# Patient Record
Sex: Female | Born: 1935 | Race: White | Hispanic: No | Marital: Married | Smoking: Never smoker
Health system: Southern US, Community
[De-identification: ages and names within clinical notes are randomized; demographics above are authoritative.]

## PROBLEM LIST (undated history)

## (undated) DIAGNOSIS — R32 Unspecified urinary incontinence: Secondary | ICD-10-CM

## (undated) DIAGNOSIS — T7840XA Allergy, unspecified, initial encounter: Secondary | ICD-10-CM

## (undated) DIAGNOSIS — E785 Hyperlipidemia, unspecified: Secondary | ICD-10-CM

## (undated) DIAGNOSIS — I4891 Unspecified atrial fibrillation: Secondary | ICD-10-CM

## (undated) DIAGNOSIS — R001 Bradycardia, unspecified: Secondary | ICD-10-CM

## (undated) DIAGNOSIS — G459 Transient cerebral ischemic attack, unspecified: Secondary | ICD-10-CM

## (undated) DIAGNOSIS — M069 Rheumatoid arthritis, unspecified: Secondary | ICD-10-CM

## (undated) DIAGNOSIS — E559 Vitamin D deficiency, unspecified: Secondary | ICD-10-CM

## (undated) DIAGNOSIS — K219 Gastro-esophageal reflux disease without esophagitis: Secondary | ICD-10-CM

## (undated) HISTORY — DX: Unspecified urinary incontinence: R32

## (undated) HISTORY — DX: Vitamin D deficiency, unspecified: E55.9

## (undated) HISTORY — DX: Hyperlipidemia, unspecified: E78.5

## (undated) HISTORY — DX: Allergy, unspecified, initial encounter: T78.40XA

## (undated) HISTORY — DX: Gastro-esophageal reflux disease without esophagitis: K21.9

---
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
## 1992-09-27 HISTORY — PX: EYE SURGERY: SHX253

## 1996-09-27 HISTORY — PX: EYE SURGERY: SHX253

## 1998-08-26 ENCOUNTER — Encounter: Payer: Self-pay | Admitting: Internal Medicine

## 1998-08-26 ENCOUNTER — Ambulatory Visit (HOSPITAL_COMMUNITY): Admission: RE | Admit: 1998-08-26 | Discharge: 1998-08-26 | Payer: Self-pay | Admitting: Internal Medicine

## 2000-02-11 ENCOUNTER — Other Ambulatory Visit: Admission: RE | Admit: 2000-02-11 | Discharge: 2000-02-11 | Payer: Self-pay | Admitting: *Deleted

## 2000-02-16 ENCOUNTER — Ambulatory Visit (HOSPITAL_COMMUNITY): Admission: RE | Admit: 2000-02-16 | Discharge: 2000-02-16 | Payer: Self-pay | Admitting: Internal Medicine

## 2000-02-16 ENCOUNTER — Encounter: Payer: Self-pay | Admitting: Internal Medicine

## 2001-02-28 ENCOUNTER — Ambulatory Visit (HOSPITAL_COMMUNITY): Admission: RE | Admit: 2001-02-28 | Discharge: 2001-02-28 | Payer: Self-pay | Admitting: Internal Medicine

## 2001-02-28 ENCOUNTER — Encounter: Payer: Self-pay | Admitting: Internal Medicine

## 2005-08-10 ENCOUNTER — Ambulatory Visit (HOSPITAL_COMMUNITY): Admission: RE | Admit: 2005-08-10 | Discharge: 2005-08-10 | Payer: Self-pay | Admitting: Internal Medicine

## 2010-01-27 ENCOUNTER — Ambulatory Visit (HOSPITAL_COMMUNITY): Admission: RE | Admit: 2010-01-27 | Discharge: 2010-01-27 | Payer: Self-pay | Admitting: Internal Medicine

## 2012-04-12 ENCOUNTER — Other Ambulatory Visit: Payer: Self-pay | Admitting: Internal Medicine

## 2012-04-12 DIAGNOSIS — R42 Dizziness and giddiness: Secondary | ICD-10-CM

## 2012-04-12 DIAGNOSIS — R2681 Unsteadiness on feet: Secondary | ICD-10-CM

## 2012-04-12 DIAGNOSIS — R519 Headache, unspecified: Secondary | ICD-10-CM

## 2012-04-13 ENCOUNTER — Other Ambulatory Visit: Payer: Self-pay

## 2013-06-25 ENCOUNTER — Encounter (HOSPITAL_COMMUNITY): Payer: Self-pay | Admitting: Emergency Medicine

## 2013-06-25 ENCOUNTER — Emergency Department (HOSPITAL_COMMUNITY): Payer: Medicare Other

## 2013-06-25 ENCOUNTER — Inpatient Hospital Stay (HOSPITAL_COMMUNITY)
Admission: EM | Admit: 2013-06-25 | Discharge: 2013-06-27 | DRG: 069 | Disposition: A | Discharge status: Home / Self Care | Payer: Medicare Other | Attending: Internal Medicine | Admitting: Internal Medicine

## 2013-06-25 DIAGNOSIS — R4789 Other speech disturbances: Secondary | ICD-10-CM | POA: Diagnosis present

## 2013-06-25 DIAGNOSIS — G459 Transient cerebral ischemic attack, unspecified: Principal | ICD-10-CM | POA: Diagnosis present

## 2013-06-25 DIAGNOSIS — I4891 Unspecified atrial fibrillation: Secondary | ICD-10-CM

## 2013-06-25 DIAGNOSIS — Z8673 Personal history of transient ischemic attack (TIA), and cerebral infarction without residual deficits: Secondary | ICD-10-CM | POA: Diagnosis present

## 2013-06-25 DIAGNOSIS — Z79899 Other long term (current) drug therapy: Secondary | ICD-10-CM

## 2013-06-25 DIAGNOSIS — I480 Paroxysmal atrial fibrillation: Secondary | ICD-10-CM | POA: Diagnosis present

## 2013-06-25 DIAGNOSIS — R4701 Aphasia: Secondary | ICD-10-CM | POA: Diagnosis present

## 2013-06-25 DIAGNOSIS — Z7982 Long term (current) use of aspirin: Secondary | ICD-10-CM

## 2013-06-25 LAB — COMPREHENSIVE METABOLIC PANEL
ALT: 11 U/L (ref 0–35)
AST: 17 U/L (ref 0–37)
Albumin: 4 g/dL (ref 3.5–5.2)
Alkaline Phosphatase: 61 U/L (ref 39–117)
CO2: 22 mEq/L (ref 19–32)
Calcium: 10 mg/dL (ref 8.4–10.5)
Chloride: 95 mEq/L — ABNORMAL LOW (ref 96–112)
GFR calc Af Amer: 59 mL/min — ABNORMAL LOW (ref >90)
GFR calc non Af Amer: 51 mL/min — ABNORMAL LOW (ref >90)
Glucose, Bld: 110 mg/dL — ABNORMAL HIGH (ref 70–99)
Potassium: 4.3 mEq/L (ref 3.5–5.1)
Sodium: 133 mEq/L — ABNORMAL LOW (ref 135–145)

## 2013-06-25 LAB — DIFFERENTIAL
Basophils Absolute: 0 10*3/uL (ref 0.0–0.1)
Eosinophils Relative: 2 % (ref 0–5)
Lymphocytes Relative: 27 % (ref 12–46)
Lymphs Abs: 1.9 10*3/uL (ref 0.7–4.0)
Neutro Abs: 4 10*3/uL (ref 1.7–7.7)
Neutrophils Relative %: 59 % (ref 43–77)

## 2013-06-25 LAB — ETHANOL: Alcohol, Ethyl (B): <11 mg/dL (ref 0–11)

## 2013-06-25 LAB — URINALYSIS, ROUTINE W REFLEX MICROSCOPIC
Bilirubin Urine: NEGATIVE
Hgb urine dipstick: NEGATIVE
Ketones, ur: 15 mg/dL — AB
Nitrite: NEGATIVE
Specific Gravity, Urine: 1.006 (ref 1.005–1.030)
Urobilinogen, UA: 0.2 mg/dL (ref 0.0–1.0)
pH: 8 (ref 5.0–8.0)

## 2013-06-25 LAB — CBC
Hemoglobin: 13.4 g/dL (ref 12.0–15.0)
MCV: 84.2 fL (ref 78.0–100.0)
Platelets: 207 10*3/uL (ref 150–400)
RBC: 4.31 MIL/uL (ref 3.87–5.11)
RDW: 12.3 % (ref 11.5–15.5)
WBC: 6.8 10*3/uL (ref 4.0–10.5)

## 2013-06-25 LAB — RAPID URINE DRUG SCREEN, HOSP PERFORMED
Barbiturates: NOT DETECTED
Cocaine: NOT DETECTED
Opiates: NOT DETECTED
Tetrahydrocannabinol: NOT DETECTED

## 2013-06-25 LAB — GLUCOSE, CAPILLARY: Glucose-Capillary: 107 mg/dL — ABNORMAL HIGH (ref 70–99)

## 2013-06-25 LAB — POCT I-STAT TROPONIN I

## 2013-06-25 LAB — PROTIME-INR: Prothrombin Time: 13.2 seconds (ref 11.6–15.2)

## 2013-06-25 LAB — URINE MICROSCOPIC-ADD ON

## 2013-06-25 LAB — APTT: aPTT: 35 seconds (ref 24–37)

## 2013-06-25 MED ORDER — ASPIRIN EC 81 MG PO TBEC
81.0000 mg | DELAYED_RELEASE_TABLET | Freq: Every day | ORAL | Status: DC
Start: 2013-06-26 — End: 2013-06-26
  Administered 2013-06-26: 81 mg via ORAL
  Filled 2013-06-25: qty 1

## 2013-06-25 MED ORDER — HEPARIN (PORCINE) IN NACL 100-0.45 UNIT/ML-% IJ SOLN
750.0000 [IU]/h | INTRAMUSCULAR | Status: DC
  Administered 2013-06-26: 750 [IU]/h via INTRAVENOUS
  Filled 2013-06-25: qty 250

## 2013-06-25 MED ORDER — ASPIRIN 81 MG PO TABS: 81.0000 mg | ORAL_TABLET | Freq: Every day | ORAL | Status: DC

## 2013-06-25 NOTE — ED Notes (Signed)
PT daughter at bedside. States that PT was in normal state when daughter had arrived at PT home this afternoon. Daughter reports that PT suddenly became confused with slurred speech. LSN 1600.

## 2013-06-25 NOTE — ED Provider Notes (Signed)
CSN: [id number]     Arrival date & time 06/25/13  1733 History   First MD Initiated Contact with Patient 06/25/13 1811     Chief Complaint  Patient presents with  . Altered Mental Status   (Consider location/radiation/quality/duration/timing/severity/associated sxs/prior Treatment) HPI This 79 year old female lives at home with family and was last known well at 4:00 this afternoon when she had sudden onset of a transient spell that lasted about 20 minutes and is now resolved of global aphasia, she had difficulty understanding others, she had difficulty expressing herself and the wrong words were coming out, she had no headache, she is no vertigo, she is no syncope, she had no chest pain or shortness of breath or abdominal pain, she had no focal or lateralizing weakness numbness or incoordination. Her episode is resolved. She feels back to baseline now. She has some persistent left-sided parathoracic pain for the last few weeks and she fell while hiking a few weeks ago. She apparently had unremarkable chest x-ray and back x-rays at that time for her doctor. She is no neck pain or midline back pain now. There is no treatment prior to arrival. History reviewed. No pertinent past medical history. Past Surgical History  Procedure Laterality Date  . Appendectomy     History reviewed. No pertinent family history. History  Substance Use Topics  . Smoking status: Never Smoker   . Smokeless tobacco: Not on file  . Alcohol Use: No   OB History   Grav Para Term Preterm Abortions TAB SAB Ect Mult Living                 Review of Systems 10 Systems reviewed and are negative for acute change except as noted in the HPI. Allergies  Review of patient's allergies indicates no known allergies.  Home Medications   Current Outpatient Rx  Name  Route  Sig  Dispense  Refill  . clopidogrel (PLAVIX) 75 MG tablet   Oral   Take 1 tablet (75 mg total) by mouth daily with breakfast.   30 tablet   0     BP 112/44  Pulse 63  Temp(Src) 97.8 F (36.6 C) (Oral)  Resp 18  Ht 5\' 5"  (1.651 m)  Wt 136 lb 6.4 oz (61.871 kg)  BMI 22.7 kg/m2  SpO2 99% Physical Exam  Nursing note and vitals reviewed. Constitutional: She is oriented to person, place, and time.  Awake, alert, nontoxic appearance with baseline speech for patient.  HENT:  Head: Atraumatic.  Mouth/Throat: No oropharyngeal exudate.  Eyes: EOM are normal. Pupils are equal, round, and reactive to light. Right eye exhibits no discharge. Left eye exhibits no discharge.  Neck: Neck supple.  Cardiovascular: Normal rate and regular rhythm.   No murmur heard. Pulmonary/Chest: Effort normal and breath sounds normal. No stridor. No respiratory distress. She has no wheezes. She has no rales. She exhibits no tenderness.  Abdominal: Soft. Bowel sounds are normal. She exhibits no mass. There is no tenderness. There is no rebound.  Musculoskeletal: She exhibits tenderness.  Baseline ROM, moves extremities with no obvious new focal weakness. Mild left upper thoracic chest wall tenderness without midline back tenderness and no midline cervical neck tenderness  Lymphadenopathy:    She has no cervical adenopathy.  Neurological: She is alert and oriented to person, place, and time.  Awake, alert, cooperative and aware of situation; motor strength bilaterally; sensation normal to light touch bilaterally; peripheral visual fields full to confrontation; no facial asymmetry; tongue midline; major  cranial nerves appear intact; no pronator drift, normal finger to nose bilaterally  Skin: No rash noted.  Psychiatric: She has a normal mood and affect.    ED Course  Procedures (including critical care time) ECG: Sinus rhythm with multiple premature atrial and ventricular complexes, right bundle branch block, left axis deviation, no comparison ECG available   [facility] paged. 2005 D/w [facility] will see Pt in ED. 2055 Pt stable in ED with no significant  deterioration in condition. Patient / Family / Caregiver informed of clinical course, understand medical decision-making process, and agree with plan. Labs Review Labs Reviewed  CBC - Abnormal; Notable for the following:    MCHC 36.9 (*)    All other components within normal limits  COMPREHENSIVE METABOLIC PANEL - Abnormal; Notable for the following:    Sodium 133 (*)    Chloride 95 (*)    Glucose, Bld 110 (*)    GFR calc non Af Amer 51 (*)    GFR calc Af Amer 59 (*)    All other components within normal limits  URINALYSIS, ROUTINE W REFLEX MICROSCOPIC - Abnormal; Notable for the following:    Ketones, ur 15 (*)    Leukocytes, UA LARGE (*)    All other components within normal limits  GLUCOSE, CAPILLARY - Abnormal; Notable for the following:    Glucose-Capillary 107 (*)    All other components within normal limits  URINE MICROSCOPIC-ADD ON - Abnormal; Notable for the following:    Bacteria, UA FEW (*)    All other components within normal limits  CBC - Abnormal; Notable for the following:    MCHC 36.1 (*)    All other components within normal limits  BASIC METABOLIC PANEL - Abnormal; Notable for the following:    Sodium 132 (*)    CO2 14 (*)    GFR calc non Af Amer 59 (*)    GFR calc Af Amer 68 (*)    All other components within normal limits  URINE CULTURE  ETHANOL  PROTIME-INR  APTT  DIFFERENTIAL  TROPONIN I  URINE RAPID DRUG SCREEN (HOSP PERFORMED)  LIPID PANEL  CBC  GLUCOSE, CAPILLARY  GLUCOSE, CAPILLARY  HEMOGLOBIN A1C  GLUCOSE, CAPILLARY  GLUCOSE, CAPILLARY  POCT I-STAT TROPONIN I   Imaging Review Ct Head Wo Contrast  06/25/2013   *RADIOLOGY REPORT*  Clinical Data: Larey Seat Saturday afternoon, sudden onset of confusion this afternoon  CT HEAD WITHOUT CONTRAST  Technique:  Contiguous axial images were obtained from the base of the skull through the vertex without contrast.  Comparison: None.  Findings: The calvarium is intact.  There is no abnormal attenuation to  suggest hemorrhage, infarct, mass, or extra-axial fluid.  There is mild age-related mineralization of the basal ganglia.  There is age related mild to moderate atrophy.  IMPRESSION: No acute findings   Original Report Authenticated By: Esperanza Heir, M.D.   Mri Brain Without Contrast  06/26/2013   CLINICAL DATA:  79 year old female with transient aphasia that lasted 20 min. Atrial fibrillation. Initial encounter.  EXAM: MRI HEAD WITHOUT CONTRAST  MRA HEAD WITHOUT CONTRAST  TECHNIQUE: Multiplanar, multiecho pulse sequences of the brain and surrounding structures were obtained without intravenous contrast. Angiographic images of the head were obtained using MRA technique without contrast.  COMPARISON:  Head CT without contrast 06/25/2013.  FINDINGS: MRI HEAD FINDINGS  Cerebral volume is within normal limits for age. No restricted diffusion to suggest acute infarction. No midline shift, mass effect, evidence of mass lesion, ventriculomegaly, extra-axial  collection or acute intracranial hemorrhage. Cervicomedullary junction and pituitary are within normal limits. Negative visualized cervical spine. Major intracranial vascular flow voids are preserved. Wallace Cullens and white matter signal is within normal limits throughout the brain.  Postoperative changes to the globes. Right maxillary sinus mucous retention cyst. Other Visualized paranasal sinuses and mastoids are clear. Normal visualized internal auditory structures. Visualized scalp soft tissues are within normal limits. Normal bone marrow signal.  MRA HEAD FINDINGS  Antegrade flow in the posterior circulation with codominant distal vertebral arteries. Normal PICA origins. Normal vertebrobasilar junction. No basilar stenosis. SCA and PCA origins within normal limits. Posterior communicating arteries are diminutive or absent. Bilateral PCA branches are within normal limits.  Antegrade flow in both ICA siphons. Ophthalmic artery origins within normal limits. No ICA  stenosis. Normal carotid termini, MCA and ACA origins. Anterior communicating artery diminutive or absent. Visualized bilateral ACA and MCA branches are within normal limits.  IMPRESSION: MRI HEAD IMPRESSION  Normal MRI appearance of the brain.  MRA HEAD IMPRESSION  Negative intracranial MRA.   Electronically Signed   By: Augusto Gamble M.D.   On: 06/26/2013 17:52   Mr Maxine Glenn Head/brain Wo Cm  06/26/2013   CLINICAL DATA:  79 year old female with transient aphasia that lasted 20 min. Atrial fibrillation. Initial encounter.  EXAM: MRI HEAD WITHOUT CONTRAST  MRA HEAD WITHOUT CONTRAST  TECHNIQUE: Multiplanar, multiecho pulse sequences of the brain and surrounding structures were obtained without intravenous contrast. Angiographic images of the head were obtained using MRA technique without contrast.  COMPARISON:  Head CT without contrast 06/25/2013.  FINDINGS: MRI HEAD FINDINGS  Cerebral volume is within normal limits for age. No restricted diffusion to suggest acute infarction. No midline shift, mass effect, evidence of mass lesion, ventriculomegaly, extra-axial collection or acute intracranial hemorrhage. Cervicomedullary junction and pituitary are within normal limits. Negative visualized cervical spine. Major intracranial vascular flow voids are preserved. Wallace Cullens and white matter signal is within normal limits throughout the brain.  Postoperative changes to the globes. Right maxillary sinus mucous retention cyst. Other Visualized paranasal sinuses and mastoids are clear. Normal visualized internal auditory structures. Visualized scalp soft tissues are within normal limits. Normal bone marrow signal.  MRA HEAD FINDINGS  Antegrade flow in the posterior circulation with codominant distal vertebral arteries. Normal PICA origins. Normal vertebrobasilar junction. No basilar stenosis. SCA and PCA origins within normal limits. Posterior communicating arteries are diminutive or absent. Bilateral PCA branches are within normal  limits.  Antegrade flow in both ICA siphons. Ophthalmic artery origins within normal limits. No ICA stenosis. Normal carotid termini, MCA and ACA origins. Anterior communicating artery diminutive or absent. Visualized bilateral ACA and MCA branches are within normal limits.  IMPRESSION: MRI HEAD IMPRESSION  Normal MRI appearance of the brain.  MRA HEAD IMPRESSION  Negative intracranial MRA.   Electronically Signed   By: Augusto Gamble M.D.   On: 06/26/2013 17:52    MDM   1. TIA (transient ischemic attack)   2. Aphasia   3. Paroxysmal a-fib    The patient appears reasonably stabilized for admission considering the current resources, flow, and capabilities available in the ED at this time, and I doubt any other [facility] requiring further screening and/or treatment in the ED prior to admission.    Hurman Horn, MD 06/27/13 [phone]

## 2013-06-25 NOTE — ED Notes (Signed)
[facility] from home. Call for sudden onset of confusion this afternoon. Recent fall on Saturday. Ambulatory with unsteady gait on scene

## 2013-06-25 NOTE — ED Notes (Signed)
Report attempted 

## 2013-06-25 NOTE — Progress Notes (Signed)
ANTICOAGULATION CONSULT NOTE - Initial Consult  Pharmacy Consult for heparin Indication: New Afib w/ possible TIA/CVA  No Known Allergies  Patient Measurements: Height: 5\' 5"  (165.1 cm) Weight: 136 lb 6.4 oz (61.871 kg) IBW/kg (Calculated) : 57  Vital Signs: Temp: 98.3 F (36.8 C) (09/29 2321) Temp src: Oral (09/29 2321) BP: 183/88 mmHg (09/29 2321) Pulse Rate: 61 (09/29 2321)  Labs:  Recent Labs  06/25/13 1858  HGB 13.4  HCT 36.3  PLT 207  APTT 35  LABPROT 13.2  INR 1.02  CREATININE 0.96  TROPONINI <0.30    Estimated Creatinine Clearance: 36.4 ml/min (by C-G formula based on Cr of 0.96).   Medical History: History reviewed. No pertinent past medical history.  Medications:  Prescriptions prior to admission  Medication Sig Dispense Refill  . aspirin 81 MG tablet Take 81 mg by mouth daily.      . Cholecalciferol (VITAMIN D PO) Take 1 tablet by mouth daily.      Marland Kitchen GLUCOSAMINE-CHONDROITIN PO Take 1 tablet by mouth daily.      Marland Kitchen ibuprofen (ADVIL,MOTRIN) 200 MG tablet Take 200 mg by mouth every 6 (six) hours as needed for pain.      . vitamin B-12 (CYANOCOBALAMIN) 100 MCG tablet Take 50 mcg by mouth daily.       Scheduled:  . [START ON 06/26/2013] aspirin EC  81 mg Oral Daily    Assessment: 79yo female had sudden onset of confusion lasting , found to be in Afib which is new Dx for pt, with possible TIA/CVA; CT of head negative for bleed.  Goal of Therapy:  Heparin level 0.3-0.5 units/ml Monitor platelets by anticoagulation protocol: Yes   Plan:  Will begin heparin gtt at 750 units/hr and monitor heparin levels and CBC.  Vernard Gambles, PharmD, BCPS 06/25/2013,11:38 PM

## 2013-06-25 NOTE — Consult Note (Addendum)
NEURO HOSPITALIST CONSULT NOTE    Reason for Consult:transient dysphasia.  HPI:                                                                                                                                          Erica Carroll is an 79 y.o. female with no significant past medical history, brought to [facility] ED by her daughter for evaluation of a transient episode of language impairment. She said that she was at home this afternoon when suddenly experienced abrupt onset of difficulty to speak. She said that she knew what she wanted to say but couldn't form ad get words out. Daughter said that she was naming things wrongly and having trouble with comprehension. The episode lasted for about 20 minutes and completely resolved. No reported HA, vertigo, double vision, difficulty swallowing, focal weakness or numbness, slurred speech, imbalance, or visual disturbance. CT brain here at [facility] showed no acute abnormality.    History reviewed. No pertinent past medical history.  Past Surgical History  Procedure Laterality Date  . Appendectomy      History reviewed. No pertinent family history.   Social History:  reports that she has never smoked. She does not have any smokeless tobacco history on file. She reports that she does not drink alcohol or use illicit drugs.  No Known Allergies  MEDICATIONS:                                                                                                                     I have reviewed the patient's current medications.   ROS:  History obtained from the patient and daughter  General ROS: negative for - chills, fatigue, fever, night sweats, weight gain or weight loss Psychological ROS: negative for - behavioral disorder, hallucinations, mood swings or suicidal ideation. Mild memory  difficulty Ophthalmic ROS: negative for - blurry vision, double vision, eye pain or loss of vision ENT ROS: negative for - epistaxis, nasal discharge, oral lesions, sore throat, tinnitus or vertigo Allergy and Immunology ROS: negative for - hives or itchy/watery eyes Hematological and Lymphatic ROS: negative for - bleeding problems, bruising or swollen lymph nodes Endocrine ROS: negative for - galactorrhea, hair pattern changes, polydipsia/polyuria or temperature intolerance Respiratory ROS: negative for - cough, hemoptysis, shortness of breath or wheezing Cardiovascular ROS: negative for - chest pain, dyspnea on exertion, edema or irregular heartbeat Gastrointestinal ROS: negative for - abdominal pain, diarrhea, hematemesis, nausea/vomiting or stool incontinence Genito-Urinary ROS: negative for - dysuria, hematuria, incontinence or urinary frequency/urgency Musculoskeletal ROS: negative for - joint swelling or muscular weakness Neurological ROS: as noted in HPI Dermatological ROS: negative for rash and skin lesion changes   Physical exam: pleasant female in no apparent distress.Blood pressure 183/88, pulse 61, temperature 98.3 F (36.8 C), temperature source Oral, resp. rate 16, height 5\' 5"  (1.651 m), weight 61.871 kg (136 lb 6.4 oz), SpO2 97.00%.  Head: normocephalic. Neck: supple, no bruits, no JVD. Cardiac: no murmurs. Lungs: clear. Abdomen: soft, no tender, no mass. Extremities: no edema.  Neurologic Examination:                                                                                                      Mental Status: Alert, oriented, thought content appropriate.  Speech fluent without evidence of aphasia.  Able to follow 3 step commands without difficulty. Cranial Nerves: II: Discs flat bilaterally; Visual fields grossly normal, pupils equal, round, reactive to light and accommodation III,IV, VI: ptosis not present, extra-ocular motions intact bilaterally V,VII: smile  symmetric, facial light touch sensation normal bilaterally VIII: hearing normal bilaterally IX,X: gag reflex present XI: bilateral shoulder shrug XII: midline tongue extension Motor: Right : Upper extremity   5/5    Left:     Upper extremity   5/5  Lower extremity   5/5     Lower extremity   5/5 Tone and bulk:normal tone throughout; no atrophy noted Sensory: Pinprick and light touch intact throughout, bilaterally Deep Tendon Reflexes:  Right: Upper Extremity   Left: Upper extremity   biceps (C-5 to C-6) 2/4   biceps (C-5 to C-6) 2/4 tricep (C7) 2/4    triceps (C7) 2/4 Brachioradialis (C6) 2/4  Brachioradialis (C6) 2/4  Lower Extremity Lower Extremity  quadriceps (L-2 to L-4) 2/4   quadriceps (L-2 to L-4) 2/4 Achilles (S1) 2/4   Achilles (S1) 2/4  Plantars: Right: downgoing   Left: downgoing Cerebellar: normal finger-to-nose,  normal heel-to-shin test Gait:  No ataxia. CV: pulses palpable throughout    No results found for this basename: cbc, bmp, coags, chol, tri, ldl, hga1c    Results for orders placed during the hospital encounter of 06/25/13 (from the past  48 hour(s))  ETHANOL     Status: None   Collection Time    06/25/13  6:58 PM      Result Value Range   Alcohol, Ethyl (B) <11  0 - 11 mg/dL   Comment:            LOWEST DETECTABLE LIMIT FOR     SERUM ALCOHOL IS 11 mg/dL     FOR MEDICAL PURPOSES ONLY  PROTIME-INR     Status: None   Collection Time    06/25/13  6:58 PM      Result Value Range   Prothrombin Time 13.2  11.6 - 15.2 seconds   INR 1.02  0.00 - 1.49  APTT     Status: None   Collection Time    06/25/13  6:58 PM      Result Value Range   aPTT 35  24 - 37 seconds  CBC     Status: Abnormal   Collection Time    06/25/13  6:58 PM      Result Value Range   WBC 6.8  4.0 - 10.5 K/uL   RBC 4.31  3.87 - 5.11 MIL/uL   Hemoglobin 13.4  12.0 - 15.0 g/dL   HCT 40.9  81.1 - 91.4 %   MCV 84.2  78.0 - 100.0 fL   MCH 31.1  26.0 - 34.0 pg   MCHC 36.9 (*) 30.0  - 36.0 g/dL   RDW 78.2  95.6 - 21.3 %   Platelets 207  150 - 400 K/uL  DIFFERENTIAL     Status: None   Collection Time    06/25/13  6:58 PM      Result Value Range   Neutrophils Relative % 59  43 - 77 %   Neutro Abs 4.0  1.7 - 7.7 K/uL   Lymphocytes Relative 27  12 - 46 %   Lymphs Abs 1.9  0.7 - 4.0 K/uL   Monocytes Relative 11  3 - 12 %   Monocytes Absolute 0.8  0.1 - 1.0 K/uL   Eosinophils Relative 2  0 - 5 %   Eosinophils Absolute 0.1  0.0 - 0.7 K/uL   Basophils Relative 0  0 - 1 %   Basophils Absolute 0.0  0.0 - 0.1 K/uL  COMPREHENSIVE METABOLIC PANEL     Status: Abnormal   Collection Time    06/25/13  6:58 PM      Result Value Range   Sodium 133 (*) 135 - 145 mEq/L   Potassium 4.3  3.5 - 5.1 mEq/L   Chloride 95 (*) 96 - 112 mEq/L   CO2 22  19 - 32 mEq/L   Glucose, Bld 110 (*) 70 - 99 mg/dL   BUN 12  6 - 23 mg/dL   Creatinine, Ser 0.86  0.50 - 1.10 mg/dL   Calcium 57.8  8.4 - 46.9 mg/dL   Total Protein 7.1  6.0 - 8.3 g/dL   Albumin 4.0  3.5 - 5.2 g/dL   AST 17  0 - 37 U/L   ALT 11  0 - 35 U/L   Alkaline Phosphatase 61  39 - 117 U/L   Total Bilirubin 0.5  0.3 - 1.2 mg/dL   GFR calc non Af Amer 51 (*) >90 mL/min   GFR calc Af Amer 59 (*) >90 mL/min   Comment: (NOTE)     The eGFR has been calculated using the CKD EPI equation.     This calculation has not  been validated in all clinical situations.     eGFR's persistently <90 mL/min signify possible Chronic Kidney     Disease.  TROPONIN I     Status: None   Collection Time    06/25/13  6:58 PM      Result Value Range   Troponin I <0.30  <0.30 ng/mL   Comment:            Due to the release kinetics of cTnI,     a negative result within the first hours     of the onset of symptoms does not rule out     myocardial infarction with certainty.     If myocardial infarction is still suspected,     repeat the test at appropriate intervals.  POCT I-STAT TROPONIN I     Status: None   Collection Time    06/25/13  7:20 PM       Result Value Range   Troponin i, poc 0.05  0.00 - 0.08 ng/mL   Comment 3            Comment: Due to the release kinetics of cTnI,     a negative result within the first hours     of the onset of symptoms does not rule out     myocardial infarction with certainty.     If myocardial infarction is still suspected,     repeat the test at appropriate intervals.  URINE RAPID DRUG SCREEN (HOSP PERFORMED)     Status: None   Collection Time    06/25/13  7:40 PM      Result Value Range   Opiates NONE DETECTED  NONE DETECTED   Cocaine NONE DETECTED  NONE DETECTED   Benzodiazepines NONE DETECTED  NONE DETECTED   Amphetamines NONE DETECTED  NONE DETECTED   Tetrahydrocannabinol NONE DETECTED  NONE DETECTED   Barbiturates NONE DETECTED  NONE DETECTED   Comment:            DRUG SCREEN FOR MEDICAL PURPOSES     ONLY.  IF CONFIRMATION IS NEEDED     FOR ANY PURPOSE, NOTIFY LAB     WITHIN 5 DAYS.                LOWEST DETECTABLE LIMITS     FOR URINE DRUG SCREEN     Drug Class       Cutoff (ng/mL)     Amphetamine      1000     Barbiturate      200     Benzodiazepine   200     Tricyclics       300     Opiates          300     Cocaine          300     THC              50  URINALYSIS, ROUTINE W REFLEX MICROSCOPIC     Status: Abnormal   Collection Time    06/25/13  7:40 PM      Result Value Range   Color, Urine YELLOW  YELLOW   APPearance CLEAR  CLEAR   Specific Gravity, Urine 1.006  1.005 - 1.030   pH 8.0  5.0 - 8.0   Glucose, UA NEGATIVE  NEGATIVE mg/dL   Hgb urine dipstick NEGATIVE  NEGATIVE   Bilirubin Urine NEGATIVE  NEGATIVE   Ketones, ur 15 (*) NEGATIVE mg/dL  Protein, ur NEGATIVE  NEGATIVE mg/dL   Urobilinogen, UA 0.2  0.0 - 1.0 mg/dL   Nitrite NEGATIVE  NEGATIVE   Leukocytes, UA LARGE (*) NEGATIVE  URINE MICROSCOPIC-ADD ON     Status: Abnormal   Collection Time    06/25/13  7:40 PM      Result Value Range   Squamous Epithelial / LPF RARE  RARE   WBC, UA 7-10  <3 WBC/hpf    RBC / HPF 0-2  <3 RBC/hpf   Bacteria, UA FEW (*) RARE  GLUCOSE, CAPILLARY     Status: Abnormal   Collection Time    06/25/13  8:17 PM      Result Value Range   Glucose-Capillary 107 (*) 70 - 99 mg/dL    Ct Head Wo Contrast  06/25/2013   *RADIOLOGY REPORT*  Clinical Data: Larey Seat Saturday afternoon, sudden onset of confusion this afternoon  CT HEAD WITHOUT CONTRAST  Technique:  Contiguous axial images were obtained from the base of the skull through the vertex without contrast.  Comparison: None.  Findings: The calvarium is intact.  There is no abnormal attenuation to suggest hemorrhage, infarct, mass, or extra-axial fluid.  There is mild age-related mineralization of the basal ganglia.  There is age related mild to moderate atrophy.  IMPRESSION: No acute findings   Original Report Authenticated By: Esperanza Heir, M.D.     Assessment/Plan: 79 Y/O with probable TIA involving anterior circulation left brain. Currently asymptomatic, normal neuro-exam. Admit to medicine and complete TIA work up. Aspirin 81 mg daily. Will follow up.  Wyatt Portela, MD [facility] [phone]  06/25/2013, 11:49 PM

## 2013-06-26 ENCOUNTER — Inpatient Hospital Stay (HOSPITAL_COMMUNITY): Payer: Medicare Other

## 2013-06-26 DIAGNOSIS — G459 Transient cerebral ischemic attack, unspecified: Secondary | ICD-10-CM

## 2013-06-26 LAB — CBC
HCT: 37.9 % (ref 36.0–46.0)
Hemoglobin: 13.5 g/dL (ref 12.0–15.0)
MCHC: 35.6 g/dL (ref 30.0–36.0)
Platelets: 183 10*3/uL (ref 150–400)
RBC: 4.46 MIL/uL (ref 3.87–5.11)
RDW: 12.5 % (ref 11.5–15.5)
WBC: 7.2 10*3/uL (ref 4.0–10.5)

## 2013-06-26 LAB — LIPID PANEL
Cholesterol: 131 mg/dL (ref 0–200)
HDL: 50 mg/dL (ref >39)
LDL Cholesterol: 60 mg/dL (ref 0–99)
Total CHOL/HDL Ratio: 2.6 RATIO
Triglycerides: 103 mg/dL (ref <150)

## 2013-06-26 LAB — GLUCOSE, CAPILLARY
Glucose-Capillary: 93 mg/dL (ref 70–99)
Glucose-Capillary: 84 mg/dL (ref 70–99)
Glucose-Capillary: 85 mg/dL (ref 70–99)

## 2013-06-26 MED ORDER — CLOPIDOGREL BISULFATE 75 MG PO TABS
75.0000 mg | ORAL_TABLET | Freq: Every day | ORAL | Status: DC
  Administered 2013-06-27: 75 mg via ORAL
  Filled 2013-06-26 (×2): qty 1

## 2013-06-26 MED ORDER — ASPIRIN EC 325 MG PO TBEC: 325.0000 mg | DELAYED_RELEASE_TABLET | Freq: Every day | ORAL | Status: DC

## 2013-06-26 NOTE — Progress Notes (Signed)
Nutrition Brief Note  Patient identified on the Malnutrition Screening Tool (MST) Report for recent weight lost without trying and eating poorly because of a decreased appetite.  Wt Readings from Last 15 Encounters:  06/25/13 136 lb 6.4 oz (61.871 kg)    Body mass index is 22.7 kg/(m^2). Patient meets criteria for Normal based on current BMI.   Current diet order is Heart Healthy, patient is consuming approximately 90% of meals at this time. Labs and medications reviewed.   No nutrition interventions warranted at this time. If nutrition issues arise, please consult RD.   Maureen Chatters, RD, LDN Pager #: [phone] After-Hours Pager #: [phone]

## 2013-06-26 NOTE — Progress Notes (Signed)
VASCULAR LAB PRELIMINARY  PRELIMINARY  PRELIMINARY  PRELIMINARY  Carotid duplex  completed.    Preliminary report:  Bilateral:  1-39% ICA stenosis.  Vertebral artery flow is antegrade.      Emre Stock, RVT 06/26/2013, 2:33 PM

## 2013-06-26 NOTE — Progress Notes (Signed)
Patient was admitted with CVA and there was a report of AFib with RVR by the admitting MD. I cannot find any evidence of AFib .  She has lots of artifact on the monitor and this could have been mis-intrepreted as AF.   At age 79, it would not be surprising to find AFib.    [facility] Will be happy to assist you with TEE and implantable loop recorder if indicated.  Will cancel consult  Alvia Grove., MD, [facility] 06/26/2013, 10:29 AM Office - [phone] Pager [phone]

## 2013-06-26 NOTE — H&P (Signed)
[facility] History and Physical  Erica Carroll:[id number] DOB: 1921/10/19    PCP:   Nadean Corwin, MD   Chief Complaint: transcient aphasia.  HPI: Erica Carroll is an 79 y.o. female with benign PMH on daily ASA, presents to the ER with 20 minutes of with global aphasia.  She did not have any HA, focal motor deficits, nausea, vomiting, fever or chills.  EMS noted her HR was 130 at that time.  In the ER, she is asymptomatic.  EKG by EMS showed afib with controlled ventricular rate.  EKG in the ER showed NSR with PACs and no acute changes.  She never felt any irregularity in her rhythm.  Evaluation in the ER included a head CT which was negative.  Her serology was unremarkable.  Hospitalist was asked to admit her for further evaluation and treatment.    Rewiew of Systems:  Constitutional: Negative for malaise, fever and chills. No significant weight loss or weight gain Eyes: Negative for eye pain, redness and discharge, diplopia, visual changes, or flashes of light. ENMT: Negative for ear pain, hoarseness, nasal congestion, sinus pressure and sore throat. No headaches; tinnitus, drooling, or problem swallowing. Cardiovascular: Negative for chest pain, palpitations, diaphoresis, dyspnea and peripheral edema. ; No orthopnea, PND Respiratory: Negative for cough, hemoptysis, wheezing and stridor. No pleuritic chestpain. Gastrointestinal: Negative for nausea, vomiting, diarrhea, constipation, abdominal pain, melena, blood in stool, hematemesis, jaundice and rectal bleeding.    Genitourinary: Negative for frequency, dysuria, incontinence,flank pain and hematuria; Musculoskeletal: Negative for back pain and neck pain. Negative for swelling and trauma.;  Skin: . Negative for pruritus, rash, abrasions, bruising and skin lesion.; ulcerations Neuro: Negative for headache, lightheadedness and neck stiffness. Negative for weakness, altered level of consciousness , altered mental status,  extremity weakness, burning feet, involuntary movement, seizure and syncope.  Psych: negative for anxiety, depression, insomnia, tearfulness, panic attacks, hallucinations, paranoia, suicidal or homicidal ideation    History reviewed. No pertinent past medical history.  Past Surgical History  Procedure Laterality Date  . Appendectomy      Medications:  HOME MEDS: Prior to Admission medications   Medication Sig Start Date End Date Taking? Authorizing Provider  aspirin 81 MG tablet Take 81 mg by mouth daily.   Yes Historical Provider, MD  Cholecalciferol (VITAMIN D PO) Take 1 tablet by mouth daily.   Yes Historical Provider, MD  GLUCOSAMINE-CHONDROITIN PO Take 1 tablet by mouth daily.   Yes Historical Provider, MD  ibuprofen (ADVIL,MOTRIN) 200 MG tablet Take 200 mg by mouth every 6 (six) hours as needed for pain.   Yes Historical Provider, MD  vitamin B-12 (CYANOCOBALAMIN) 100 MCG tablet Take 50 mcg by mouth daily.   Yes Historical Provider, MD     Allergies:  No Known Allergies  Social History:   reports that she has never smoked. She does not have any smokeless tobacco history on file. She reports that she does not drink alcohol or use illicit drugs.  Family History: History reviewed. No pertinent family history.   Physical Exam: Filed Vitals:   06/25/13 2208 06/25/13 2321 06/26/13 0128 06/26/13 0344  BP:  183/88 147/55 137/58  Pulse:  61 58 63  Temp: 98.7 F (37.1 C) 98.3 F (36.8 C) 98.2 F (36.8 C) 97.5 F (36.4 C)  TempSrc:  Oral Oral Oral  Resp:  16 16 16   Height:      Weight:  61.871 kg (136 lb 6.4 oz)    SpO2:  97% 99% 97%  Blood pressure 137/58, pulse 63, temperature 97.5 F (36.4 C), temperature source Oral, resp. rate 16, height 5\' 5"  (1.651 m), weight 61.871 kg (136 lb 6.4 oz), SpO2 97.00%.  GEN:  Pleasant  patient lying in the stretcher in no acute distress; cooperative with exam. She is hard of hearing. PSYCH:  alert and oriented x4; does not appear  anxious or depressed; affect is appropriate. HEENT: Mucous membranes pink and anicteric; PERRLA; EOM intact; no cervical lymphadenopathy nor thyromegaly or carotid bruit; no JVD; There were no stridor. Neck is very supple. Breasts:: Not examined CHEST WALL: No tenderness CHEST: Normal respiration, clear to auscultation bilaterally.  HEART: Regular rate and rhythm.  There are no murmur, rub, or gallops.   BACK: No kyphosis or scoliosis; no CVA tenderness ABDOMEN: soft and non-tender; no masses, no organomegaly, normal abdominal bowel sounds; no pannus; no intertriginous candida. There is no rebound and no distention. Rectal Exam: Not done EXTREMITIES: No bone or joint deformity; age-appropriate arthropathy of the hands and knees; no edema; no ulcerations.  There is no calf tenderness. Genitalia: not examined PULSES: 2+ and symmetric SKIN: Normal hydration no rash or ulceration CNS: Cranial nerves 2-12 grossly intact no focal lateralizing neurologic deficit.  Speech is fluent; uvula elevated with phonation, facial symmetry and tongue midline. DTR are normal bilaterally, cerebella exam is intact, barbinski is negative and strengths are equaled bilaterally.  No sensory loss.   Labs on Admission:  Basic Metabolic Panel:  Recent Labs Lab 06/25/13 1858  NA 133*  K 4.3  CL 95*  CO2 22  GLUCOSE 110*  BUN 12  CREATININE 0.96  CALCIUM 10.0   Liver Function Tests:  Recent Labs Lab 06/25/13 1858  AST 17  ALT 11  ALKPHOS 61  BILITOT 0.5  PROT 7.1  ALBUMIN 4.0   No results found for this basename: LIPASE, AMYLASE,  in the last 168 hours No results found for this basename: AMMONIA,  in the last 168 hours CBC:  Recent Labs Lab 06/25/13 1858  WBC 6.8  NEUTROABS 4.0  HGB 13.4  HCT 36.3  MCV 84.2  PLT 207   Cardiac Enzymes:  Recent Labs Lab 06/25/13 1858  TROPONINI <0.30    CBG:  Recent Labs Lab 06/25/13 2017  GLUCAP 107*     Radiological Exams on Admission: Ct  Head Wo Contrast  06/25/2013   *RADIOLOGY REPORT*  Clinical Data: Larey Seat Saturday afternoon, sudden onset of confusion this afternoon  CT HEAD WITHOUT CONTRAST  Technique:  Contiguous axial images were obtained from the base of the skull through the vertex without contrast.  Comparison: None.  Findings: The calvarium is intact.  There is no abnormal attenuation to suggest hemorrhage, infarct, mass, or extra-axial fluid.  There is mild age-related mineralization of the basal ganglia.  There is age related mild to moderate atrophy.  IMPRESSION: No acute findings   Original Report Authenticated By: Esperanza Heir, M.D.    EKG: Independently reviewed. NSR with PAC. EMS EKG:  afib with controlled rate.   Assessment/Plan Present on Admission:  . TIA (transient ischemic attack) . Paroxysmal a-fib  PLAN:  SInce she has PAF, and does n't know if she had it for greater than 48 hours, now with TIA/CVA suspicious for thromboembolism, I will heparinize her along with continuing ASA.  Will admit her for full work up to include brain MRI/MRAs, along with ECHO and carotid ultrasounds.  I have consulted neuro for further recommendation.  Please also consult cardiology later today as  well.  She is stable, full code, and will be admitted to [facility] service.  Thank you for allowing me to participate in her care.  I have updated her treatment plan with her and her daughter who is an Charity fundraiser as well.  Other plans as per orders.  Code Status: FULL Unk Lightning, MD. [facility] Pager [phone] 7pm to 7am.  06/26/2013, 4:44 AM

## 2013-06-26 NOTE — Progress Notes (Signed)
Stroke Team Progress Note  HISTORY Erica Carroll is an 79 y.o. female with no significant past medical history, brought to [facility] ED by her daughter for evaluation of a transient episode of language impairment.   She said that she was at home this afternoon when suddenly experienced abrupt onset of difficulty to speak. She said that she knew what she wanted to say but couldn't form ad get words out. Daughter said that she was naming things wrongly and having trouble with comprehension. The episode lasted for about 20 minutes and completely resolved.  No reported HA, vertigo, double vision, difficulty swallowing, focal weakness or numbness, slurred speech, imbalance, or visual disturbance.   CT brain here at [facility] showed no acute abnormality   Patient was not a TPA candidate secondary to resolution of symptoms. She was admitted  for further evaluation and treatment. Patient was placed on IVHeparin as EKG strips looked like afib from EMS. Not with chart.  SUBJECTIVE family at the bedside.  Overall she feels her condition is gradually improving.    OBJECTIVE Most recent Vital Signs: Filed Vitals:   06/26/13 0344 06/26/13 0534 06/26/13 0830 06/26/13 1001  BP: 137/58 136/64 109/54 120/71  Pulse: 63 58 67 51  Temp: 97.5 F (36.4 C) 97.9 F (36.6 C) 98.2 F (36.8 C) 97.8 F (36.6 C)  TempSrc: Oral Oral Oral Oral  Resp: 16 16 18 18   Height:      Weight:      SpO2: 97% 99% 96% 98%   CBG (last 3)   Recent Labs  06/25/13 2017 06/26/13 0644 06/26/13 1150  GLUCAP 107* 93 84    IV Fluid Intake:      MEDICATIONS  . [START ON 06/27/2013] aspirin EC  325 mg Oral Daily   PRN:    Diet:  Cardiac thin liquids Activity:   Bathroom privileges with assistance  DVT Prophylaxis:  IV heparin infusion  CLINICALLY SIGNIFICANT STUDIES Basic Metabolic Panel:   Recent Labs Lab 06/25/13 1858  NA 133*  K 4.3  CL 95*  CO2 22  GLUCOSE 110*  BUN 12  CREATININE 0.96  CALCIUM 10.0   Liver  Function Tests:   Recent Labs Lab 06/25/13 1858  AST 17  ALT 11  ALKPHOS 61  BILITOT 0.5  PROT 7.1  ALBUMIN 4.0   CBC:   Recent Labs Lab 06/25/13 1858 06/26/13 1219  WBC 6.8 7.2  NEUTROABS 4.0  --   HGB 13.4 13.5  HCT 36.3 37.9  MCV 84.2 85.0  PLT 207 183   Coagulation:   Recent Labs Lab 06/25/13 1858  LABPROT 13.2  INR 1.02   Cardiac Enzymes:   Recent Labs Lab 06/25/13 1858  TROPONINI <0.30   Urinalysis:   Recent Labs Lab 06/25/13 1940  COLORURINE YELLOW  LABSPEC 1.006  PHURINE 8.0  GLUCOSEU NEGATIVE  HGBUR NEGATIVE  BILIRUBINUR NEGATIVE  KETONESUR 15*  PROTEINUR NEGATIVE  UROBILINOGEN 0.2  NITRITE NEGATIVE  LEUKOCYTESUR LARGE*   Lipid Panel    Component Value Date/Time   CHOL 131 06/26/2013 0500   LDL 60  HgbA1C  No results found for this basename: HGBA1C    Urine Drug Screen:     Component Value Date/Time   LABOPIA NONE DETECTED 06/25/2013 1940   COCAINSCRNUR NONE DETECTED 06/25/2013 1940   LABBENZ NONE DETECTED 06/25/2013 1940   AMPHETMU NONE DETECTED 06/25/2013 1940   THCU NONE DETECTED 06/25/2013 1940   LABBARB NONE DETECTED 06/25/2013 1940    Alcohol Level:  Recent Labs Lab 06/25/13 1858  ETH <11    Ct Head Wo Contrast 06/25/2013 No acute findings    MRI of the brain    MRA of the brain    2D Echocardiogram    Carotid Doppler    CXR    EKG  IN ED shows sinus rhythm EKG from EMS reported as afib. I do not have a copy of this.   Therapy Recommendations   Physical Exam   Mental Status:  Alert, oriented, thought content appropriate. Speech fluent without evidence of aphasia. Able to follow 3 step commands without difficulty.  Cranial Nerves:  II: Discs flat bilaterally; Visual fields grossly normal, pupils equal, round, reactive to light and accommodation  III,IV, VI: ptosis not present, extra-ocular motions intact bilaterally  V,VII: smile symmetric, facial light touch sensation normal bilaterally  VIII:  hearing normal bilaterally  IX,X: gag reflex present  XI: bilateral shoulder shrug  XII: midline tongue extension  Motor:  Right : Upper extremity 5/5 Left: Upper extremity 5/5  Lower extremity 5/5 Lower extremity 5/5  Tone and bulk:normal tone throughout; no atrophy noted  Sensory: Pinprick and light touch intact throughout, bilaterally  Deep Tendon Reflexes:  Symmetric 2+  Plantars:  Right: downgoing Left: downgoing  Cerebellar:  normal finger-to-nose, normal heel-to-shin test  Gait:  No ataxia.  CV: pulses palpable throughout    ASSESSMENT Erica Carroll is a 79 y.o. female presenting with aphasia. MR imaging pending. Infarct felt to be embolic secondary to unknown source.  On aspirin 81 mg orally every day prior to admission. Now on aspirin 81 mg orally every day plus IV HEPARIN for secondary stroke prevention. Patient with resultant transient aphasia. Work up underway.   Transient ischemic attack, studies below pending  Hospital day # 1  TREATMENT/PLAN  Change from aspirin to Plavix 75mg  daily for secondary stroke prevention.  Discontinue IV Heparin. Continue to monitor for atrial fibrillation. None documented so far. No history of it in the past. If present can switch to NOAC.  Risk factor modification  Therapy evaluations ordered  MR/carotid/echo/hgba1c pending  Gwendolyn Lima. Manson Passey, PAC, MBA, MHA [facility] Pager: [phone] 06/26/2013 2:00 PM  I have personally obtained a history, examined the patient, evaluated imaging results, and formulated the assessment and plan of care. I agree with the above. Delia Heady, MD

## 2013-06-26 NOTE — Progress Notes (Signed)
[facility] PROGRESS NOTE  Erica Carroll EAV:[id number] DOB: October 13, 1921 DOA: 06/25/2013 PCP: Nadean Corwin, MD  Assessment/Plan: Principal Problem:   TIA (transient ischemic attack) Active Problems:   Paroxysmal a-fib    1. TIA (transient ischemic attack): Patient presented with sudden onset expressive dysphasia and word-finding difficulties, which resolved after about 15-20 minutes, and has not recurred. She had no limb weakness or visual obscurations. Head CT scan was devoid of acute findings. Patient was on ASA 81 mg pre-admission. This has been increased to 325 mg daily,, and TIA/CVA work up is in progress. Dr Wyatt Portela provided neurology consultation. 2. Query Paroxysmal A. Fib: Per EMS, patient was said to be in fast atrial fibrillation en route to the ED. EKG in the ED revealed SR, with multiple PVCs, and no atrial fibrillation has been documented so far, on telemetric monitoring. IV Heparin was initially commenced by admitting MD, butt I have discontinued this. Patient may benefit from an event monitor.   Code Status: Full Code.  Family Communication:  Disposition Plan: To be determined.    Brief narrative: 79 y.o. female with benign PMH on daily ASA, presents to the ER with 20 minutes of with global aphasia. She did not have any HA, focal motor deficits, nausea, vomiting, fever or chills. EMS noted her HR was 130 at that time. In the ER, she is asymptomatic. EKG by EMS showed afib with controlled ventricular rate. EKG in the ER showed NSR with PACs and no acute changes. She never felt any irregularity in her rhythm. Evaluation in the ER included a head CT which was negative. Her serology was unremarkable. Hospitalist was asked to admit her for further evaluation and treatment.    Consultants:  Dr Wyatt Portela.  Cardiology.   Procedures:  Head CT scan.   Antibiotics:  N/A.   HPI/Subjective: Asyptomatic.  Objective: Vital signs in last 24  hours: Temp:  [97.5 F (36.4 C)-98.7 F (37.1 C)] 97.9 F (36.6 C) (09/30 0534) Pulse Rate:  [58-65] 58 (09/30 0534) Resp:  [16] 16 (09/30 0534) BP: (127-183)/(55-95) 136/64 mmHg (09/30 0534) SpO2:  [97 %-100 %] 99 % (09/30 0534) Weight:  [61.871 kg (136 lb 6.4 oz)] 61.871 kg (136 lb 6.4 oz) (09/29 2321) Weight change:     Intake/Output from previous day: 09/29 0701 - 09/30 0700 In: -  Out: 1 [Urine:1]     Physical Exam: General: Comfortable, alert, communicative, fully oriented, not short of breath at rest.  HEENT:  No clinical pallor, no jaundice, no conjunctival injection or discharge. Hydration is satisfactory.  NECK:  Supple, JVP not seen, no carotid bruits, no palpable lymphadenopathy, no palpable goiter. CHEST:  Clinically clear to auscultation, no wheezes, no crackles. HEART:  Sounds 1 and 2 heard, normal, irregular, no murmurs. ABDOMEN:  Full, soft, non-tender, no palpable organomegaly, no palpable masses, normal bowel sounds. GENITALIA:  Not examined. LOWER EXTREMITIES:  No pitting edema, palpable peripheral pulses. MUSCULOSKELETAL SYSTEM:  Generalized osteoarthritic changes, otherwise, normal. CENTRAL NERVOUS SYSTEM:  No focal neurologic deficit on gross examination.  Lab Results:  Recent Labs  06/25/13 1858  WBC 6.8  HGB 13.4  HCT 36.3  PLT 207    Recent Labs  06/25/13 1858  NA 133*  K 4.3  CL 95*  CO2 22  GLUCOSE 110*  BUN 12  CREATININE 0.96  CALCIUM 10.0   No results found for this or any previous visit (from the past 240 hour(s)).   Studies/Results: Ct Head Wo Contrast  06/25/2013   *RADIOLOGY REPORT*  Clinical Data: Larey Seat Saturday afternoon, sudden onset of confusion this afternoon  CT HEAD WITHOUT CONTRAST  Technique:  Contiguous axial images were obtained from the base of the skull through the vertex without contrast.  Comparison: None.  Findings: The calvarium is intact.  There is no abnormal attenuation to suggest hemorrhage, infarct,  mass, or extra-axial fluid.  There is mild age-related mineralization of the basal ganglia.  There is age related mild to moderate atrophy.  IMPRESSION: No acute findings   Original Report Authenticated By: Esperanza Heir, M.D.    Medications: Scheduled Meds: . aspirin EC  81 mg Oral Daily   Continuous Infusions: . heparin 750 Units/hr (06/26/13 0045)   PRN Meds:.    LOS: 1 day   Havana Baldwin,CHRISTOPHER  [facility] Pager [phone]. If 8PM-8AM, please contact night-coverage at [email or web address], password [facility] 06/26/2013, 7:31 AM  LOS: 1 day

## 2013-06-27 DIAGNOSIS — I359 Nonrheumatic aortic valve disorder, unspecified: Secondary | ICD-10-CM

## 2013-06-27 LAB — URINE CULTURE: Culture: NO GROWTH

## 2013-06-27 LAB — CBC
Hemoglobin: 13.2 g/dL (ref 12.0–15.0)
MCHC: 36.1 g/dL — ABNORMAL HIGH (ref 30.0–36.0)
MCV: 85.3 fL (ref 78.0–100.0)
Platelets: 210 10*3/uL (ref 150–400)
RBC: 4.29 MIL/uL (ref 3.87–5.11)
RDW: 12.7 % (ref 11.5–15.5)

## 2013-06-27 LAB — BASIC METABOLIC PANEL
BUN: 13 mg/dL (ref 6–23)
GFR calc Af Amer: 68 mL/min — ABNORMAL LOW (ref >90)
GFR calc non Af Amer: 59 mL/min — ABNORMAL LOW (ref >90)
Glucose, Bld: 82 mg/dL (ref 70–99)
Potassium: 4.8 mEq/L (ref 3.5–5.1)
Sodium: 132 mEq/L — ABNORMAL LOW (ref 135–145)

## 2013-06-27 LAB — HEMOGLOBIN A1C
Hgb A1c MFr Bld: 5.6 % (ref <5.7)
Mean Plasma Glucose: 114 mg/dL (ref <117)

## 2013-06-27 MED ORDER — CLOPIDOGREL BISULFATE 75 MG PO TABS: 75.0000 mg | ORAL_TABLET | Freq: Every day | ORAL | Status: DC

## 2013-06-27 NOTE — Evaluation (Signed)
Physical Therapy Evaluation Patient Details Name: Erica Carroll MRN: [id number] DOB: Jan 05, 1922 Today's Date: 06/27/2013 Time: 4098-1191 PT Time Calculation (min): 22 min  PT Assessment / Plan / Recommendation History of Present Illness  79 y.o. active female admitted to [facility] on 06/25/13 with difficulty talking.  Pt being worked up for TIA vs stroke.  CT and MRI were negative.    Clinical Impression  The pt is moving well, but has some mild balance deficits that showed up with higher level gait and balance training.  I mentioned this to her son and educated him on how to get f/u OP PT for balance if the balance issues do not seem to resolve once she goes home and is back to her normal activities (she is not normally in the bed this much).  PT will follow acutely for higher level balance and gait training and testing and for now does not recommend any therapy f/u.      PT Assessment  Patient needs continued PT services    Follow Up Recommendations  No PT follow up;Supervision - Intermittent    Does the patient have the potential to tolerate intense rehabilitation     Yes  Barriers to Discharge Other (comment) (None) None    Equipment Recommendations  None recommended by PT    Recommendations for Other Services Other (comment) (None)   Frequency Min 4X/week    Precautions / Restrictions Precautions Precautions: Fall Precaution Comments: h/[place] fall [place] the [place]   Pertinent Vitals/Pain See vitals flow sheet.       Mobility  Bed Mobility Bed Mobility: Supine to Sit;Sitting - Scoot to Edge of Bed Supine to Sit: 6: Modified independent (Device/Increase time);With rails;HOB elevated Sitting - Scoot to Edge of Bed: 6: Modified independent (Device/Increase time);With rail Details for Bed Mobility Assistance: pt used, but did not rely on railing to get into and out of bed.   Transfers Transfers: Sit to Stand;Stand to Sit Sit to Stand: 5: Supervision Stand to Sit: 5:  Supervision Details for Transfer Assistance: supervision for safety Ambulation/Gait Ambulation/Gait Assistance: 5: Supervision Ambulation Distance (Feet): 150 Feet Assistive device: None Ambulation/Gait Assistance Details: with head turns (walking and talking with head turned to the left) she loses her balance to the right, but not as bed when she turns her head to the right and does the same thing.  180 degree turns are slow and she has a mild stagger with theses as well.  Gait Pattern: Step-through pattern (staggering at times-very mild) Gait velocity: WNL Stairs: Yes Stairs Assistance: 6: Modified independent (Device/Increase time) Stair Management Technique: Two rails;Alternating pattern;Forwards Number of Stairs: 5 Modified Rankin (Stroke Patients Only) Pre-Morbid Rankin Score: No symptoms Modified Rankin: No significant disability        PT Diagnosis: Difficulty walking;Abnormality of gait  PT Problem List: Decreased balance PT Treatment Interventions: Gait training;Balance training;Therapeutic exercise;Therapeutic activities;Functional mobility training;Neuromuscular re-education;Patient/family education     PT Goals(Current goals can be found in the care plan section) Acute Rehab PT Goals Patient Stated Goal: to go home, cook this weekend for her family PT Goal Formulation: With patient/family Time For Goal Achievement: 07/11/13 Potential to Achieve Goals: Good  Visit Information  Last PT Received On: 06/27/13 Assistance Needed: +1 History of Present Illness: 79 y.o. active female admitted to [facility] on 06/25/13 with difficulty talking.  Pt being worked up for TIA vs stroke.  CT and MRI were negative.         Prior Functioning  Home Living Family/patient expects to be discharged to:: Private residence Living Arrangements: Spouse/significant other;Children Available Help at Discharge: Family;Available 24 hours/day (daughter who is an Charity fundraiser is coming to stay with her for the  wee) Type of Home: House Home Access: Stairs to enter [employer] of Steps: 1-stoop Entrance Stairs-Rails: None Home Layout: One level Home Equipment: None Additional Comments: pt is very physically active Prior Function Level of Independence: Independent Comments: cooks, cleans, stays very active. Communication Communication: HOH    Cognition  Cognition Arousal/Alertness: Awake/alert Behavior During Therapy: WFL for tasks assessed/performed Overall Cognitive Status: Within Functional Limits for tasks assessed (not specifically tested)    Extremity/Trunk Assessment Upper Extremity Assessment Upper Extremity Assessment: Defer to OT evaluation Lower Extremity Assessment Lower Extremity Assessment: Overall WFL for tasks assessed Cervical / Trunk Assessment Cervical / Trunk Assessment: Normal   Balance Balance Balance Assessed: Yes Static Sitting Balance Static Sitting - Balance Support: No upper extremity supported;Feet supported Static Sitting - Level of Assistance: 7: Independent Static Standing Balance Static Standing - Balance Support: No upper extremity supported Static Standing - Level of Assistance: 7: Independent Dynamic Standing Balance Dynamic Standing - Balance Support: No upper extremity supported Dynamic Standing - Level of Assistance: 5: Stand by assistance High Level Balance High Level Balance Activites: Turns;Direction changes;Head turns High Level Balance Comments: supervision needed due to mild staggering with higher level activities.    End of Session PT - End of Session Activity Tolerance: Patient tolerated treatment well Patient left: Other (comment) (with OT headed to the bathroom. )    [name] [name], PT, DPT [phone]   06/27/2013, 3:41 PM

## 2013-06-27 NOTE — Clinical Social Work Note (Signed)
CSW received consult for Medicaid questions. CSW consulted with financial counseling services, which referred CSW to DSS. CSW met with pt's husband, daughter, and son at bedside. CSW provided pt's family with [facility] DSS information. No other needs were noted from family. CSW followed-up with RN.   Darlyn Chamber, MSW, LCSWA Clinical Social Work [phone]

## 2013-06-27 NOTE — Progress Notes (Signed)
Stroke Team Progress Note  HISTORY Erica Carroll is an 79 y.o. female with no significant past medical history, brought to [facility] ED by her daughter for evaluation of a transient episode of language impairment.   She said that she was at home this afternoon when suddenly experienced abrupt onset of difficulty to speak. She said that she knew what she wanted to say but couldn't form ad get words out. Daughter said that she was naming things wrongly and having trouble with comprehension. The episode lasted for about 20 minutes and completely resolved.  No reported HA, vertigo, double vision, difficulty swallowing, focal weakness or numbness, slurred speech, imbalance, or visual disturbance.   CT brain here at [facility] showed no acute abnormality   Patient was not a TPA candidate secondary to resolution of symptoms. She was admitted  for further evaluation and treatment. Patient was placed on IVHeparin as EKG strips looked like afib from EMS. Not with chart.  SUBJECTIVE No new symptoms.    OBJECTIVE Most recent Vital Signs: Filed Vitals:   06/27/13 0136 06/27/13 0530 06/27/13 1000 06/27/13 1359  BP: 133/52 126/60 133/65 112/44  Pulse: 82 77 100 63  Temp: 98 F (36.7 C) 97.4 F (36.3 C) 97.5 F (36.4 C) 97.8 F (36.6 C)  TempSrc: Oral Oral Oral   Resp: 18 18 18 18   Height:      Weight:      SpO2: 100% 100% 99% 99%   CBG (last 3)   Recent Labs  06/26/13 1150 06/26/13 1713 06/27/13 1147  GLUCAP 84 85 88    IV Fluid Intake:      MEDICATIONS  . clopidogrel  75 mg Oral Q breakfast   PRN:    Diet:  Cardiac thin liquids Activity:   Bathroom privileges with assistance  DVT Prophylaxis:   CLINICALLY SIGNIFICANT STUDIES Basic Metabolic Panel:   Recent Labs Lab 06/25/13 1858 06/27/13 0530  NA 133* 132*  K 4.3 4.8  CL 95* 101  CO2 22 14*  GLUCOSE 110* 82  BUN 12 13  CREATININE 0.96 0.86  CALCIUM 10.0 8.9   Liver Function Tests:   Recent Labs Lab 06/25/13 1858  AST 17   ALT 11  ALKPHOS 61  BILITOT 0.5  PROT 7.1  ALBUMIN 4.0   CBC:   Recent Labs Lab 06/25/13 1858 06/26/13 1219 06/27/13 0530  WBC 6.8 7.2 6.8  NEUTROABS 4.0  --   --   HGB 13.4 13.5 13.2  HCT 36.3 37.9 36.6  MCV 84.2 85.0 85.3  PLT 207 183 210   Coagulation:   Recent Labs Lab 06/25/13 1858  LABPROT 13.2  INR 1.02   Cardiac Enzymes:   Recent Labs Lab 06/25/13 1858  TROPONINI <0.30   Urinalysis:   Recent Labs Lab 06/25/13 1940  COLORURINE YELLOW  LABSPEC 1.006  PHURINE 8.0  GLUCOSEU NEGATIVE  HGBUR NEGATIVE  BILIRUBINUR NEGATIVE  KETONESUR 15*  PROTEINUR NEGATIVE  UROBILINOGEN 0.2  NITRITE NEGATIVE  LEUKOCYTESUR LARGE*   Lipid Panel    Component Value Date/Time   CHOL 131 06/26/2013 0500   LDL 60  HgbA1C  Lab Results  Component Value Date   HGBA1C 5.6 06/27/2013    Urine Drug Screen:     Component Value Date/Time   LABOPIA NONE DETECTED 06/25/2013 1940   COCAINSCRNUR NONE DETECTED 06/25/2013 1940   LABBENZ NONE DETECTED 06/25/2013 1940   AMPHETMU NONE DETECTED 06/25/2013 1940   THCU NONE DETECTED 06/25/2013 1940   LABBARB NONE DETECTED  06/25/2013 1940    Alcohol Level:   Recent Labs Lab 06/25/13 1858  ETH <11    Ct Head Wo Contrast 06/25/2013 No acute findings    MRI of the brain Normal MRI appearance of the brain.   MRA of the brain Negative intracranial MRA.  2D Echocardiogram EF 55%, wall motion normal. No asd or pfo.  Carotid Doppler  Bilateral: 1-39% ICA stenosis. Vertebral artery flow is antegrade.   CXR    EKG  IN ED shows sinus rhythm EKG from EMS reported as afib. I do not have a copy of this.   Therapy Recommendations none  Physical Exam   Mental Status:  Alert, oriented, thought content appropriate. Speech fluent without evidence of aphasia. Able to follow 3 step commands without difficulty.  Cranial Nerves:  II: Discs flat bilaterally; Visual fields grossly normal, pupils equal, round, reactive to light and  accommodation  III,IV, VI: ptosis not present, extra-ocular motions intact bilaterally  V,VII: smile symmetric, facial light touch sensation normal bilaterally  VIII: hearing normal bilaterally  IX,X: gag reflex present  XI: bilateral shoulder shrug  XII: midline tongue extension  Motor:  Right : Upper extremity 5/5 Left: Upper extremity 5/5  Lower extremity 5/5 Lower extremity 5/5  Tone and bulk:normal tone throughout; no atrophy noted  Sensory: Pinprick and light touch intact throughout, bilaterally  Deep Tendon Reflexes:  Symmetric 2+  Plantars:  Right: downgoing Left: downgoing  Cerebellar:  normal finger-to-nose, normal heel-to-shin test  Gait:  No ataxia.  CV: pulses palpable throughout    ASSESSMENT Erica Carroll is a 79 y.o. female presenting with aphasia. MR imaging pending. Infarct felt to be embolic secondary to unknown source.  On aspirin 81 mg orally every day prior to admission. Now on  plavix 75 mg for secondary stroke prevention. Patient with resultant transient aphasia. Work up underway.   Transient ischemic attack  Hospital day # 2  TREATMENT/PLAN  Change from aspirin to Plavix 75mg  daily for secondary stroke prevention.   Risk factor modification  Stroke service will sign off.  Patient may follow up with Dr. Pearlean Brownie in 2 months if further symptoms, otherwise follow up with primary physician within next 2 weeks.  Gwendolyn Lima. Manson Passey, [facility], MBA, MHA [facility] Pager: [phone] 06/27/2013 4:17 PM  I have personally obtained a history, examined the patient, evaluated imaging results, and formulated the assessment and plan of care. I agree with the above. Delia Heady, MD

## 2013-06-27 NOTE — Evaluation (Addendum)
Occupational Therapy Evaluation Patient Details Name: Erica Carroll MRN: [id number] DOB: [birth year] Today's Date: 06/27/2013 Time: 6063-0160 OT Time Calculation (min): 20 min  OT Assessment / Plan / Recommendation History of present illness 79 y.o. active female admitted to [facility] on 06/25/13 with difficulty talking.  Pt being worked up for TIA vs stroke.  CT and MRI were negative.     Clinical Impression   Pt presents with below problem list. Pt independent with ADLs, PTA. Pt will benefit from acute OT to further assess vision and to improve independence prior to d/c. Pt moving well during evaluation. Presents with mild balance deficits and possible vision deficit noted-to be further assessed next session. Pt will have daughter staying with her for a little while.     OT Assessment  Patient needs continued OT Services    Follow Up Recommendations  No OT follow up;Supervision/Assistance - 24 hour    Barriers to Discharge      Equipment Recommendations  Tub/shower seat    Recommendations for Other Services    Frequency  Min 2X/week    Precautions / Restrictions Precautions Precautions: Fall Precaution Comments: h[place] fall [place]   Pertinent Vitals/Pain No pain reported.     ADL  Grooming: Set up Where Assessed - Grooming: Supported sitting Upper Body Bathing: Set up Where Assessed - Upper Body Bathing: Supported sitting Lower Body Bathing: Min guard Where Assessed - Lower Body Bathing: Supported standing Upper Body Dressing: Set up Where Assessed - Upper Body Dressing: Supported sitting Lower Body Dressing: Set up;Supervision/safety Where Assessed - Lower Body Dressing: Unsupported sit to stand Toilet Transfer: Supervision/safety Toilet Transfer Method: Sit to Barista: Regular height toilet Tub/Shower Transfer: Simulated;Minimal assistance Tub/Shower Transfer Method: Science writer: Other (comment)  (practiced stepping over) Transfers/Ambulation Related to ADLs: Min A-hand held assist. Supevision for transfers. ADL Comments: OT educated on having rugs picked up in house for safety. Also, recommended sitting to bathe. Had pt simulate stepping over tub-Min A for balance. Checked vision and will assess further in next session.  Educated on safe shoe wear.    OT Diagnosis: Disturbance of vision (Decreased balance)  OT Problem List: Impaired balance (sitting and/or standing);Impaired vision/perception;Decreased knowledge of use of DME or AE OT Treatment Interventions: Self-care/ADL training;DME and/or AE instruction;Therapeutic activities;Patient/family education;Balance training;Visual/perceptual remediation/compensation   OT Goals(Current goals can be found in the care plan section) Acute Rehab OT Goals Patient Stated Goal: go home OT Goal Formulation: With patient Time For Goal Achievement: 07/04/13 Potential to Achieve Goals: Good ADL Goals Pt Will Transfer to Toilet: with modified independence;ambulating;regular height toilet;grab bars Pt Will Perform Toileting - Clothing Manipulation and hygiene: with modified independence;sit to/from stand Pt Will Perform Tub/Shower Transfer: Tub transfer;with supervision;ambulating;shower seat Additional ADL Goal #1: Pt will participate in further vision assessment.  Visit Information  Last OT Received On: 06/27/13 Assistance Needed: +1 History of Present Illness: 79 y.o. active female admitted to [facility] on 06/25/13 with difficulty talking.  Pt being worked up for TIA vs stroke.  CT and MRI were negative.         Prior Functioning     Home Living Family/patient expects to be discharged to:: Private residence Living Arrangements: Spouse/significant other;Children Available Help at Discharge: Family;Available 24 hours/day (daughter who is an Erica Carroll fundraiser is coming to stay with her for the wee) Type of Home: House Home Access: Stairs to enter [employer] of Steps: 1-stoop Entrance Stairs-Rails: None Home Layout: One  level Home Equipment: Cane - quad;Grab bars - tub/shower;Bedside commode Additional Comments: pt is very physically active Prior Function Level of Independence: Independent Comments: cooks, cleans, stays very active. Communication Communication: HOH         Vision/Perception Vision - History Baseline Vision: Other (comment) (glasses for driving) Visual History: Cataracts;Other (comment) (had lens put in) Patient Visual Report: Other (comment) (some blurriness in left eye) Vision - Assessment Vision Assessment: Vision tested Tracking/Visual Pursuits: Other (comment) (difficulty tracking to left side at times/decreased smoothness) Visual Fields: Other (comment) (to be further tested-pt had difficulty with superior area)   Cognition  Cognition Arousal/Alertness: Awake/alert Behavior During Therapy: WFL for tasks assessed/performed Overall Cognitive Status: Within Functional Limits for tasks assessed    Extremity/Trunk Assessment Upper Extremity Assessment Upper Extremity Assessment: Overall WFL for tasks assessed     Mobility Bed Mobility Bed Mobility: Not assessed   Transfers Transfers: Sit to Stand;Stand to Sit Sit to Stand: 5: Supervision;From chair/3-in-1;From toilet Stand to Sit: 5: Supervision;To chair/3-in-1;To toilet Details for Transfer Assistance: supervision for safety     Exercise        End of Session OT - End of Session Activity Tolerance: Patient tolerated treatment well Patient left: in chair;with family/visitor present  GO     Earlie Raveling OTR/L [phone] 06/27/2013, 5:26 PM

## 2013-06-27 NOTE — Discharge Summary (Signed)
Physician Discharge Summary  Erica Carroll WGN:[id number] DOB: 12/26/1921 DOA: 06/25/2013  PCP: Erica Corwin, MD  Admit date: 06/25/2013 Discharge date: 06/27/2013  Time spent: > 35 minutes  Recommendations for Outpatient Follow-up:  1. F/u with neurologist in 2 months  Discharge Diagnoses:  Principal Problem:   TIA (transient ischemic attack)   Discharge Condition: stable  Diet recommendation: heart healthy  Filed Weights   06/25/13 2321  Weight: 61.871 kg (136 lb 6.4 oz)    History of present illness:  79 y/o female with no significant PMH who presented to the ED after developing global aphasia which resolved during her hospital stay without any intervention.  Hospital Course:   1. TIA - Carotid Dopplers showing no ICA stenosis  - MRI/MRA: reportedly negative for acute process with MRI report stating normal MRI appearance of the brain.  - Echocardiogram results reviewed and EF within normal limits at 55-60 percent.   - Currently on plavix per neuro recommendations.  - Neurology evaluated and recommended the following: Change from aspirin to Plavix 75mg  daily for secondary stroke prevention.  Risk factor modification  Stroke service will sign off.  Patient may follow up with Dr. Pearlean Brownie in 2 months if further symptoms, otherwise follow up with primary physician within next 2 weeks.   Reported Atrial fibrillation  - Case reviewed by Cardiologist who did not find evidence of atrial fibrillation but found initial EKG's to have artifact. HR has fluctuated from 51-100 while in house. No red flag reported to me on telemetry.     Procedures:  As listed above and below  Consultations:  Neurology: Dr. Pearlean Brownie  Discharge Exam: Filed Vitals:   06/27/13 1359  BP: 112/44  Pulse: 63  Temp: 97.8 F (36.6 C)  Resp: 18    General: Pt in NAD, Alert and awake Cardiovascular: RRR, no MRG Respiratory: CTA BL, no wheezes  Discharge Instructions  Discharge Orders    Future Orders Complete By Expires   Call MD for:  extreme fatigue  As directed    Diet - low sodium heart healthy  As directed    Discharge instructions  As directed    Comments:     Please be sure to follow up with your neurologist in 2 months   Increase activity slowly  As directed        Medication List    STOP taking these medications       aspirin 81 MG tablet     ibuprofen 200 MG tablet  Commonly known as:  ADVIL,MOTRIN      TAKE these medications       clopidogrel 75 MG tablet  Commonly known as:  PLAVIX  Take 1 tablet (75 mg total) by mouth daily with breakfast.     GLUCOSAMINE-CHONDROITIN PO  Take 1 tablet by mouth daily.     vitamin B-12 100 MCG tablet  Commonly known as:  CYANOCOBALAMIN  Take 50 mcg by mouth daily.     VITAMIN D PO  Take 1 tablet by mouth daily.       No Known Allergies    The results of significant diagnostics from this hospitalization (including imaging, microbiology, ancillary and laboratory) are listed below for reference.    Significant Diagnostic Studies: Ct Head Wo Contrast  06/25/2013   *RADIOLOGY REPORT*  Clinical Data: Larey Seat Saturday afternoon, sudden onset of confusion this afternoon  CT HEAD WITHOUT CONTRAST  Technique:  Contiguous axial images were obtained from the base of the skull through the  vertex without contrast.  Comparison: None.  Findings: The calvarium is intact.  There is no abnormal attenuation to suggest hemorrhage, infarct, mass, or extra-axial fluid.  There is mild age-related mineralization of the basal ganglia.  There is age related mild to moderate atrophy.  IMPRESSION: No acute findings   Original Report Authenticated By: Esperanza Heir, M.D.   Mri Brain Without Contrast  06/26/2013   CLINICAL DATA:  79 year old female with transient aphasia that lasted 20 min. Atrial fibrillation. Initial encounter.  EXAM: MRI HEAD WITHOUT CONTRAST  MRA HEAD WITHOUT CONTRAST  TECHNIQUE: Multiplanar, multiecho pulse  sequences of the brain and surrounding structures were obtained without intravenous contrast. Angiographic images of the head were obtained using MRA technique without contrast.  COMPARISON:  Head CT without contrast 06/25/2013.  FINDINGS: MRI HEAD FINDINGS  Cerebral volume is within normal limits for age. No restricted diffusion to suggest acute infarction. No midline shift, mass effect, evidence of mass lesion, ventriculomegaly, extra-axial collection or acute intracranial hemorrhage. Cervicomedullary junction and pituitary are within normal limits. Negative visualized cervical spine. Major intracranial vascular flow voids are preserved. Wallace Cullens and white matter signal is within normal limits throughout the brain.  Postoperative changes to the globes. Right maxillary sinus mucous retention cyst. Other Visualized paranasal sinuses and mastoids are clear. Normal visualized internal auditory structures. Visualized scalp soft tissues are within normal limits. Normal bone marrow signal.  MRA HEAD FINDINGS  Antegrade flow in the posterior circulation with codominant distal vertebral arteries. Normal PICA origins. Normal vertebrobasilar junction. No basilar stenosis. SCA and PCA origins within normal limits. Posterior communicating arteries are diminutive or absent. Bilateral PCA branches are within normal limits.  Antegrade flow in both ICA siphons. Ophthalmic artery origins within normal limits. No ICA stenosis. Normal carotid termini, MCA and ACA origins. Anterior communicating artery diminutive or absent. Visualized bilateral ACA and MCA branches are within normal limits.  IMPRESSION: MRI HEAD IMPRESSION  Normal MRI appearance of the brain.  MRA HEAD IMPRESSION  Negative intracranial MRA.   Electronically Signed   By: Augusto Gamble M.D.   On: 06/26/2013 17:52   Mr Maxine Glenn Head/brain Wo Cm  06/26/2013   CLINICAL DATA:  79 year old female with transient aphasia that lasted 20 min. Atrial fibrillation. Initial encounter.  EXAM:  MRI HEAD WITHOUT CONTRAST  MRA HEAD WITHOUT CONTRAST  TECHNIQUE: Multiplanar, multiecho pulse sequences of the brain and surrounding structures were obtained without intravenous contrast. Angiographic images of the head were obtained using MRA technique without contrast.  COMPARISON:  Head CT without contrast 06/25/2013.  FINDINGS: MRI HEAD FINDINGS  Cerebral volume is within normal limits for age. No restricted diffusion to suggest acute infarction. No midline shift, mass effect, evidence of mass lesion, ventriculomegaly, extra-axial collection or acute intracranial hemorrhage. Cervicomedullary junction and pituitary are within normal limits. Negative visualized cervical spine. Major intracranial vascular flow voids are preserved. Wallace Cullens and white matter signal is within normal limits throughout the brain.  Postoperative changes to the globes. Right maxillary sinus mucous retention cyst. Other Visualized paranasal sinuses and mastoids are clear. Normal visualized internal auditory structures. Visualized scalp soft tissues are within normal limits. Normal bone marrow signal.  MRA HEAD FINDINGS  Antegrade flow in the posterior circulation with codominant distal vertebral arteries. Normal PICA origins. Normal vertebrobasilar junction. No basilar stenosis. SCA and PCA origins within normal limits. Posterior communicating arteries are diminutive or absent. Bilateral PCA branches are within normal limits.  Antegrade flow in both ICA siphons. Ophthalmic artery origins within normal limits.  No ICA stenosis. Normal carotid termini, MCA and ACA origins. Anterior communicating artery diminutive or absent. Visualized bilateral ACA and MCA branches are within normal limits.  IMPRESSION: MRI HEAD IMPRESSION  Normal MRI appearance of the brain.  MRA HEAD IMPRESSION  Negative intracranial MRA.   Electronically Signed   By: Augusto Gamble M.D.   On: 06/26/2013 17:52    Microbiology: Recent Results (from the past 240 hour(s))  URINE  CULTURE     Status: None   Collection Time    06/25/13  7:40 PM      Result Value Range Status   Specimen Description URINE, CLEAN CATCH   Final   Special Requests CX ADDED AT 2201 ON [id number]   Final   Culture  Setup Time     Final   Value: 06/25/2013 23:23     Performed at [facility]   Colony Count     Final   Value: NO GROWTH     Performed at [facility]   Culture     Final   Value: NO GROWTH     Performed at [facility]   Report Status 06/27/2013 FINAL   Final     Labs: Basic Metabolic Panel:  Recent Labs Lab 06/25/13 1858 06/27/13 0530  NA 133* 132*  K 4.3 4.8  CL 95* 101  CO2 22 14*  GLUCOSE 110* 82  BUN 12 13  CREATININE 0.96 0.86  CALCIUM 10.0 8.9   Liver Function Tests:  Recent Labs Lab 06/25/13 1858  AST 17  ALT 11  ALKPHOS 61  BILITOT 0.5  PROT 7.1  ALBUMIN 4.0   No results found for this basename: LIPASE, AMYLASE,  in the last 168 hours No results found for this basename: AMMONIA,  in the last 168 hours CBC:  Recent Labs Lab 06/25/13 1858 06/26/13 1219 06/27/13 0530  WBC 6.8 7.2 6.8  NEUTROABS 4.0  --   --   HGB 13.4 13.5 13.2  HCT 36.3 37.9 36.6  MCV 84.2 85.0 85.3  PLT 207 183 210   Cardiac Enzymes:  Recent Labs Lab 06/25/13 1858  TROPONINI <0.30   BNP: BNP (last 3 results) No results found for this basename: PROBNP,  in the last 8760 hours CBG:  Recent Labs Lab 06/25/13 2017 06/26/13 0644 06/26/13 1150 06/26/13 1713 06/27/13 1147  GLUCAP 107* 93 84 85 88       Signed:  Clayburn Weekly  [facility] 06/27/2013, 4:38 PM

## 2013-06-27 NOTE — Progress Notes (Signed)
Occupational Therapy Treatment Patient Details Name: Erica Carroll MRN: [id number] DOB: 01-13-1922 Today's Date: 06/27/2013 Time: 7829-5621 OT Time Calculation (min): 28 min  OT Assessment / Plan / Recommendation  History of present illness 79 y.o. active female admitted to [facility] on 06/25/13 with difficulty talking.  Pt being worked up for TIA vs stroke.  CT and MRI were negative.     OT comments  OT further assessed vision-appears to have narrow peripheal fields and saccadic movements are jerky, however pt appears functional in her environment. Educated to drive with family and in a large parking lot and to avoid busy roads. Also, practiced tub and toilet transfer.   Follow Up Recommendations  No OT follow up;Supervision - Intermittent    Barriers to Discharge       Equipment Recommendations  Tub/shower seat    Recommendations for Other Services    Frequency Min 2X/week   Progress towards OT Goals Progress towards OT goals: Progressing toward goals  Plan Discharge plan needs to be updated    Precautions / Restrictions Precautions Precautions: Fall Precaution Comments: h/o fall when [place] the mountains   Pertinent Vitals/Pain No pain reported.     ADL  Grooming: Wash/dry hands;Supervision/safety Where Assessed - Grooming: Supported standing Upper Body Bathing: Set up Where Assessed - Upper Body Bathing: Supported sitting Lower Body Bathing: Min guard Where Assessed - Lower Body Bathing: Supported standing Upper Body Dressing: Set up Where Assessed - Upper Body Dressing: Supported sitting Lower Body Dressing: Set up;Supervision/safety Where Assessed - Lower Body Dressing: Unsupported sit to stand Toilet Transfer: Supervision/safety Toilet Transfer Method: Sit to Barista: Comfort height toilet;Grab bars Toileting - Architect and Hygiene: Supervision/safety Where Assessed - Engineer, mining and Hygiene: Sit to stand from  3-in-1 or toilet Tub/Shower Transfer: Performed;Minimal assistance Tub/Shower Transfer Method: Science writer: Other (comment);Shower seat with back (practiced stepping over tub) Transfers/Ambulation Related to ADLs: Min guard for ambulation and Supervision for transfers. ADL Comments: OT further assessed vision-appears to have narrow peripheal fields and saccadic movements are jerky, however pt appears functional in her environment. Educated to drive with family and in a large parking lot and to avoid busy roads. Educated pt to sit to bathe on a chair and also talked to son about this-told daughter earlier. Practiced stepping over tub-educated to have someone with her when doing this.     OT Diagnosis: Disturbance of vision (Decreased balance)  OT Problem List: Impaired balance (sitting and/or standing);Impaired vision/perception;Decreased knowledge of use of DME or AE OT Treatment Interventions: Self-care/ADL training;DME and/or AE instruction;Therapeutic activities;Patient/family education;Balance training;Visual/perceptual remediation/compensation   OT Goals(current goals can now be found in the care plan section) Acute Rehab OT Goals Patient Stated Goal: go home OT Goal Formulation: With patient Time For Goal Achievement: 07/04/13 Potential to Achieve Goals: Good ADL Goals Pt Will Transfer to Toilet: with modified independence;ambulating;regular height toilet;grab bars Pt Will Perform Toileting - Clothing Manipulation and hygiene: with modified independence;sit to/from stand Pt Will Perform Tub/Shower Transfer: Tub transfer;with supervision;ambulating;shower seat Additional ADL Goal #1: Pt will participate in further vision assessment.  Visit Information  Last OT Received On: 06/27/13 Assistance Needed: +1 History of Present Illness: 79 y.o. active female admitted to [facility] on 06/25/13 with difficulty talking.  Pt being worked up for TIA vs stroke.  CT and MRI  were negative.      Subjective Data      Prior Functioning  Home Living Family/patient expects to be  discharged to:: Private residence Living Arrangements: Spouse/significant other;Children Available Help at Discharge: Family;Available 24 hours/day (daughter who is an Charity fundraiser is coming to stay with her for the wee) Type of Home: House Home Access: Stairs to enter [employer] of Steps: 1-stoop Entrance Stairs-Rails: None Home Layout: One level Home Equipment: Cane - quad;Grab bars - tub/shower;Bedside commode Additional Comments: pt is very physically active Prior Function Level of Independence: Independent Comments: cooks, cleans, stays very active. Communication Communication: HOH    Cognition  Cognition Arousal/Alertness: Awake/alert Behavior During Therapy: WFL for tasks assessed/performed Overall Cognitive Status: Within Functional Limits for tasks assessed    Mobility  Bed Mobility Bed Mobility: Not assessed Transfers Transfers: Sit to Stand;Stand to Sit Sit to Stand: 5: Supervision;From chair/3-in-1;From toilet Stand to Sit: 5: Supervision;To toilet;To chair/3-in-1 Details for Transfer Assistance: used grab bar in bathroom, but pt has counter near at home. Supervision for safety.   Exercises         End of Session OT - End of Session Activity Tolerance: Patient tolerated treatment well Patient left: in chair;with family/visitor present  GO     Earlie Raveling OTR/L [phone] 06/27/2013, 6:06 PM

## 2013-06-27 NOTE — Progress Notes (Signed)
Orders received for swallow evaluation however per RN, patient has passed the RN stroke swallow screen and is tolerating diet. Defer formal evaluation per protocol. Please re-consult as needed.  Thank you  Shekela Goodridge MA, CCC-SLP [phone]   

## 2013-06-27 NOTE — Progress Notes (Signed)
Echocardiogram 2D Echocardiogram has been performed.  Erica Carroll 06/27/2013, 10:36 AM

## 2013-07-02 LAB — GLUCOSE, CAPILLARY: Glucose-Capillary: 91 mg/dL (ref 70–99)

## 2013-07-10 ENCOUNTER — Encounter (HOSPITAL_COMMUNITY): Payer: Self-pay | Admitting: Emergency Medicine

## 2013-07-10 ENCOUNTER — Inpatient Hospital Stay (HOSPITAL_COMMUNITY)
Admission: EM | Admit: 2013-07-10 | Discharge: 2013-07-14 | DRG: 310 | Disposition: A | Discharge status: Home / Self Care | Payer: Medicare Other | Attending: Internal Medicine | Admitting: Internal Medicine

## 2013-07-10 ENCOUNTER — Emergency Department (HOSPITAL_COMMUNITY): Payer: Medicare Other

## 2013-07-10 DIAGNOSIS — I4891 Unspecified atrial fibrillation: Principal | ICD-10-CM | POA: Diagnosis present

## 2013-07-10 DIAGNOSIS — Z7902 Long term (current) use of antithrombotics/antiplatelets: Secondary | ICD-10-CM

## 2013-07-10 DIAGNOSIS — I498 Other specified cardiac arrhythmias: Secondary | ICD-10-CM | POA: Diagnosis present

## 2013-07-10 DIAGNOSIS — D649 Anemia, unspecified: Secondary | ICD-10-CM

## 2013-07-10 DIAGNOSIS — Z8673 Personal history of transient ischemic attack (TIA), and cerebral infarction without residual deficits: Secondary | ICD-10-CM

## 2013-07-10 DIAGNOSIS — E876 Hypokalemia: Secondary | ICD-10-CM

## 2013-07-10 DIAGNOSIS — R55 Syncope and collapse: Secondary | ICD-10-CM

## 2013-07-10 DIAGNOSIS — R42 Dizziness and giddiness: Secondary | ICD-10-CM

## 2013-07-10 DIAGNOSIS — K219 Gastro-esophageal reflux disease without esophagitis: Secondary | ICD-10-CM

## 2013-07-10 DIAGNOSIS — R001 Bradycardia, unspecified: Secondary | ICD-10-CM

## 2013-07-10 DIAGNOSIS — G459 Transient cerebral ischemic attack, unspecified: Secondary | ICD-10-CM

## 2013-07-10 DIAGNOSIS — I480 Paroxysmal atrial fibrillation: Secondary | ICD-10-CM | POA: Diagnosis present

## 2013-07-10 DIAGNOSIS — I452 Bifascicular block: Secondary | ICD-10-CM | POA: Diagnosis present

## 2013-07-10 DIAGNOSIS — I959 Hypotension, unspecified: Secondary | ICD-10-CM | POA: Diagnosis present

## 2013-07-10 HISTORY — DX: Unspecified atrial fibrillation: I48.91

## 2013-07-10 HISTORY — DX: Transient cerebral ischemic attack, unspecified: G45.9

## 2013-07-10 HISTORY — DX: Bradycardia, unspecified: R00.1

## 2013-07-10 LAB — TSH: TSH: 0.948 u[IU]/mL (ref 0.350–4.500)

## 2013-07-10 LAB — CBC WITH DIFFERENTIAL/PLATELET
Eosinophils Relative: 2 % (ref 0–5)
Hemoglobin: 11.5 g/dL — ABNORMAL LOW (ref 12.0–15.0)
Lymphocytes Relative: 27 % (ref 12–46)
Lymphs Abs: 1.8 10*3/uL (ref 0.7–4.0)
Monocytes Relative: 10 % (ref 3–12)
Neutro Abs: 4 10*3/uL (ref 1.7–7.7)
Neutrophils Relative %: 61 % (ref 43–77)
Platelets: 156 10*3/uL (ref 150–400)
RBC: 3.75 MIL/uL — ABNORMAL LOW (ref 3.87–5.11)
WBC: 6.6 10*3/uL (ref 4.0–10.5)

## 2013-07-10 LAB — POCT I-STAT TROPONIN I: Troponin i, poc: 0.04 ng/mL (ref 0.00–0.08)

## 2013-07-10 LAB — COMPREHENSIVE METABOLIC PANEL
ALT: 8 U/L (ref 0–35)
Alkaline Phosphatase: 55 U/L (ref 39–117)
BUN: 11 mg/dL (ref 6–23)
CO2: 20 mEq/L (ref 19–32)
Chloride: 103 mEq/L (ref 96–112)
GFR calc Af Amer: 70 mL/min — ABNORMAL LOW (ref >90)
Glucose, Bld: 77 mg/dL (ref 70–99)
Potassium: 3.2 mEq/L — ABNORMAL LOW (ref 3.5–5.1)
Sodium: 136 mEq/L (ref 135–145)
Total Bilirubin: 0.3 mg/dL (ref 0.3–1.2)
Total Protein: 6.2 g/dL (ref 6.0–8.3)

## 2013-07-10 LAB — TROPONIN I: Troponin I: <0.3 ng/mL (ref <0.30)

## 2013-07-10 LAB — LIPASE, BLOOD: Lipase: 45 U/L (ref 11–59)

## 2013-07-10 MED ORDER — SODIUM CHLORIDE 0.9 % IV SOLN: 250.0000 mL | INTRAVENOUS | Status: DC | PRN

## 2013-07-10 MED ORDER — SODIUM CHLORIDE 0.9 % IJ SOLN: 3.0000 mL | Freq: Two times a day (BID) | INTRAMUSCULAR | Status: DC

## 2013-07-10 MED ORDER — ONDANSETRON HCL 4 MG PO TABS: 4.0000 mg | ORAL_TABLET | Freq: Four times a day (QID) | ORAL | Status: DC | PRN

## 2013-07-10 MED ORDER — SODIUM CHLORIDE 0.9 % IJ SOLN
3.0000 mL | Freq: Two times a day (BID) | INTRAMUSCULAR | Status: DC
  Administered 2013-07-10: 3 mL via INTRAVENOUS

## 2013-07-10 MED ORDER — ONDANSETRON HCL 4 MG/2ML IJ SOLN: 4.0000 mg | Freq: Four times a day (QID) | INTRAMUSCULAR | Status: DC | PRN

## 2013-07-10 MED ORDER — DILTIAZEM HCL 30 MG PO TABS
30.0000 mg | ORAL_TABLET | Freq: Four times a day (QID) | ORAL | Status: DC
  Filled 2013-07-10 (×6): qty 1

## 2013-07-10 MED ORDER — CLOPIDOGREL BISULFATE 75 MG PO TABS
75.0000 mg | ORAL_TABLET | Freq: Every day | ORAL | Status: DC
  Administered 2013-07-10: 75 mg via ORAL
  Filled 2013-07-10: qty 1

## 2013-07-10 MED ORDER — SODIUM CHLORIDE 0.9 % IJ SOLN: 3.0000 mL | INTRAMUSCULAR | Status: DC | PRN

## 2013-07-10 MED ORDER — HYDROCODONE-ACETAMINOPHEN 5-325 MG PO TABS: 1.0000 | ORAL_TABLET | ORAL | Status: DC | PRN

## 2013-07-10 MED ORDER — ENOXAPARIN SODIUM 40 MG/0.4ML ~~LOC~~ SOLN
40.0000 mg | SUBCUTANEOUS | Status: DC
  Administered 2013-07-10: 40 mg via SUBCUTANEOUS
  Filled 2013-07-10: qty 0.4

## 2013-07-10 MED ORDER — POTASSIUM CHLORIDE IN NACL 20-0.9 MEQ/L-% IV SOLN
INTRAVENOUS | Status: DC
  Administered 2013-07-10 (×2): via INTRAVENOUS
  Filled 2013-07-10 (×5): qty 1000

## 2013-07-10 MED ORDER — SIMETHICONE 80 MG PO CHEW
80.0000 mg | CHEWABLE_TABLET | Freq: Four times a day (QID) | ORAL | Status: AC
  Administered 2013-07-10 (×4): 80 mg via ORAL
  Filled 2013-07-10 (×4): qty 1

## 2013-07-10 MED ORDER — ASPIRIN 81 MG PO CHEW
324.0000 mg | CHEWABLE_TABLET | Freq: Once | ORAL | Status: AC
  Administered 2013-07-10: 324 mg via ORAL
  Filled 2013-07-10: qty 4

## 2013-07-10 MED ORDER — HEPARIN (PORCINE) IN NACL 100-0.45 UNIT/ML-% IJ SOLN
700.0000 [IU]/h | INTRAMUSCULAR | Status: DC
  Administered 2013-07-10: 900 [IU]/h via INTRAVENOUS
  Administered 2013-07-12: 800 [IU]/h via INTRAVENOUS
  Filled 2013-07-10 (×4): qty 250

## 2013-07-10 MED ORDER — ALUM & MAG HYDROXIDE-SIMETH 200-200-20 MG/5ML PO SUSP: 30.0000 mL | Freq: Four times a day (QID) | ORAL | Status: DC | PRN

## 2013-07-10 MED ORDER — DILTIAZEM HCL 30 MG PO TABS
30.0000 mg | ORAL_TABLET | Freq: Four times a day (QID) | ORAL | Status: DC
  Administered 2013-07-10 (×2): 30 mg via ORAL
  Filled 2013-07-10 (×4): qty 1

## 2013-07-10 MED ORDER — PANTOPRAZOLE SODIUM 40 MG PO TBEC
40.0000 mg | DELAYED_RELEASE_TABLET | Freq: Every day | ORAL | Status: DC
  Administered 2013-07-10 – 2013-07-13 (×4): 40 mg via ORAL
  Filled 2013-07-10 (×2): qty 1

## 2013-07-10 MED ORDER — SODIUM CHLORIDE 0.9 % IV BOLUS (SEPSIS)
1000.0000 mL | Freq: Once | INTRAVENOUS | Status: AC
  Administered 2013-07-10: 1000 mL via INTRAVENOUS

## 2013-07-10 MED ORDER — POTASSIUM CHLORIDE CRYS ER 20 MEQ PO TBCR
40.0000 meq | EXTENDED_RELEASE_TABLET | ORAL | Status: AC
  Administered 2013-07-10 (×2): 40 meq via ORAL
  Filled 2013-07-10 (×2): qty 2

## 2013-07-10 MED ORDER — HEPARIN BOLUS VIA INFUSION
3000.0000 [IU] | Freq: Once | INTRAVENOUS | Status: AC
  Administered 2013-07-10: 3000 [IU] via INTRAVENOUS
  Filled 2013-07-10: qty 3000

## 2013-07-10 NOTE — ED Notes (Signed)
Phlebotomy at the bedside  

## 2013-07-10 NOTE — ED Notes (Signed)
Pharmacy tech at the bedside 

## 2013-07-10 NOTE — Progress Notes (Signed)
Utilization review completed.  

## 2013-07-10 NOTE — ED Notes (Signed)
Per EMS pt states she started to feel dizzy and weak around midnight tonight, EMS states pt had not gone to sleep yet this evening. Per EMS pt is from home and lives with her husband. Pt's son was at the home when EMS arrived, pt was diaphoretic, clammy, and pale. EMS sat pt up and applied 2L 02. Pt's initial EKG presented with Atrial fibrillation. EMS reports pt's HR was as high as 193 briefly, however she was fluctuation between 120-170. EMS reports the pt converted on her own to NSR about 15 minutes prior to arrival in department. Per EMS pt was seen in the hospital on Sept. 26 for a possible stroke and was discharged with instructions to take Plavix. Pt was also was seen in the hospital about a week ago for a fall, EMS reports the pt is still sore on her left side. Per EMS pt is in NSR with an occasional PVC and PAC.

## 2013-07-10 NOTE — ED Notes (Signed)
Dr. Manly at the bedside.  

## 2013-07-10 NOTE — Progress Notes (Signed)
ANTICOAGULATION CONSULT NOTE - Initial Consult  Pharmacy Consult for heparin Indication: atrial fibrillation  No Known Allergies  Patient Measurements: Weight: 135 lb (61.236 kg)   Vital Signs: Temp: 97.4 F (36.3 C) (10/14 0948) Temp src: Oral (10/14 0948) BP: 132/47 mmHg (10/14 0948) Pulse Rate: 52 (10/14 0948)  Labs:  Recent Labs  07/10/13 0714 07/10/13 1020  HGB 11.5*  --   HCT 32.0*  --   PLT 156  --   CREATININE 0.84  --   TROPONINI  --  <0.30    The CrCl is unknown because both a height and weight (above a minimum accepted value) are required for this calculation.   Medical History: Past Medical History  Diagnosis Date  . TIA (transient ischemic attack)     a. with fall 05/2013 -> neg head CT and MRI/MRA;  b. Plavix started.  . A-fib     a. Dx 06/2013;  b. 06/2013 Echo:  EF 55-60%, no reg wma, mild AI, mod dil LA.    Medications:  Prescriptions prior to admission  Medication Sig Dispense Refill  . Cholecalciferol (VITAMIN D PO) Take 1 tablet by mouth daily.      . clopidogrel (PLAVIX) 75 MG tablet Take 1 tablet (75 mg total) by mouth daily with breakfast.  30 tablet  0  . GLUCOSAMINE-CHONDROITIN PO Take 1 tablet by mouth daily.      . vitamin B-12 (CYANOCOBALAMIN) 100 MCG tablet Take 50 mcg by mouth daily.        Assessment: 79 yo F to start heparin for afib w/ RVR in ambulance but in NSR since arriving at the hospitial.  She had recent TIA 2 weeks ago with fall 05/2013 -> neg head CT and MRI/MRA, plavix was started.  CHA2DS2VASc = 5 Anemia: Hgb dropped from 13.2 last hospitalization two wks ago to 11.5 today. No hematuria, BRBPR, melena, or excessive bleeding. IM following. Stool guaiac test ordered. Will need f/u once anticoagulation ordered.  She was given LMWH 40 mg today at 1128 am. Plavix to be stopped with starting coumadin.  CM to explore possible coverage for NOAC 2nd pt does not drive and may have difficulties getting INRs checked.    Goal of  Therapy:  Heparin level 0.3-0.7 units/ml Monitor platelets by anticoagulation protocol: Yes   Plan:  Give 3000 units bolus x 1 Start heparin infusion at 900 units/hr Check anti-Xa level in 8 hours and daily while on heparin Continue to monitor H&H and platelets Herby Abraham, Pharm.D. [phone] 07/10/2013 3:26 PM

## 2013-07-10 NOTE — ED Provider Notes (Signed)
CSN: [id number]     Arrival date & time 07/10/13  4098 History   First MD Initiated Contact with Patient 07/10/13 0540     Chief Complaint  Patient presents with  . Atrial Fibrillation   (Consider location/radiation/quality/duration/timing/severity/associated sxs/prior Treatment) HPI This patient is a very pleasant elderly woman with admission last month for syncope and recently diagnosed paroxysmal AF. She presents via EMS from home with feeling of lightheadedness, palpitations and slight "twinge" of pain in the left inferolateral mammary region. She denies experiencing chest pain at this time. Denies SOB.   Patient says she had some belching earlier in the night and then felt lightheaded while in bed. It was around that time that she felt palpitations as well. The patient was found to be in AF with RVR with rate as high as 190s but fluctuating mainly in the 120-170 range, per EMS. She was noted to be pale and diaphoretic appearing. Paramedics report that she converted spontaneously to NSR en route. Her BP was wnl. She was treated with supplemental 02 en route.   She says she still feels "a little dizzy". She denies ongoing chest discomfort or palpitations. No recent cough or fever. Reports compliance with all meds. Was recently started on Plavix. Had a normal TTE aproximately 1 month ago. .   Past Medical History  Diagnosis Date  . Stroke     possibly   Past Surgical History  Procedure Laterality Date  . Appendectomy     No family history on file. History  Substance Use Topics  . Smoking status: Never Smoker   . Smokeless tobacco: Not on file  . Alcohol Use: No   OB History   Grav Para Term Preterm Abortions TAB SAB Ect Mult Living                 Review of Systems 10 point ROS performed and is negative with the exception of sx noted above.   Allergies  Review of patient's allergies indicates no known allergies.  Home Medications   Current Outpatient Rx  Name  Route   Sig  Dispense  Refill  . Cholecalciferol (VITAMIN D PO)   Oral   Take 1 tablet by mouth daily.         . clopidogrel (PLAVIX) 75 MG tablet   Oral   Take 1 tablet (75 mg total) by mouth daily with breakfast.   30 tablet   0   . GLUCOSAMINE-CHONDROITIN PO   Oral   Take 1 tablet by mouth daily.         . vitamin B-12 (CYANOCOBALAMIN) 100 MCG tablet   Oral   Take 50 mcg by mouth daily.          BP 133/57  Pulse 70  Temp(Src) 97.9 F (36.6 C) (Oral)  Resp 18  SpO2 100% Physical Exam Gen: well developed and well nourished appearing Head: NCAT Eyes: PERL, EOMI Nose: no epistaixis or rhinorrhea Mouth/throat: mucosa is moist and pink Neck: supple, no stridor, no carotid bruits Lungs: CTA B, no wheezing, rhonchi or rales CV: RRR, no murmur, good distal pulses.  Abd: soft, notender, nondistended Back: mild kyphosis, otherwise normal to inspection Skin: warm and dry Neuro: CN ii-xii grossly intact, no focal deficits Psyche; normal affect,  calm and cooperative.   ED Course  Procedures (including critical care time) Labs Review Results for orders placed during the hospital encounter of 07/10/13 (from the past 24 hour(s))  CBC WITH DIFFERENTIAL  Status: Abnormal   Collection Time    07/10/13  7:14 AM      Result Value Range   WBC 6.6  4.0 - 10.5 K/uL   RBC 3.75 (*) 3.87 - 5.11 MIL/uL   Hemoglobin 11.5 (*) 12.0 - 15.0 g/dL   HCT 16.1 (*) 09.6 - 04.5 %   MCV 85.3  78.0 - 100.0 fL   MCH 30.7  26.0 - 34.0 pg   MCHC 35.9  30.0 - 36.0 g/dL   RDW 40.9  81.1 - 91.4 %   Platelets 156  150 - 400 K/uL   Neutrophils Relative % 61  43 - 77 %   Neutro Abs 4.0  1.7 - 7.7 K/uL   Lymphocytes Relative 27  12 - 46 %   Lymphs Abs 1.8  0.7 - 4.0 K/uL   Monocytes Relative 10  3 - 12 %   Monocytes Absolute 0.7  0.1 - 1.0 K/uL   Eosinophils Relative 2  0 - 5 %   Eosinophils Absolute 0.1  0.0 - 0.7 K/uL   Basophils Relative 0  0 - 1 %   Basophils Absolute 0.0  0.0 - 0.1 K/uL   POCT I-STAT TROPONIN I     Status: None   Collection Time    07/10/13  7:43 AM      Result Value Range   Troponin i, poc 0.04  0.00 - 0.08 ng/mL   Comment 3            EKG: NSR, no acute ischemic changes, RBBB and LAFB, normal axis, no ST - T wave abnormalities.   CXR: normal cardiac silloute, normal appearing mediastinum, no infiltrates, no acute process identified.   MDM  0845:  Patient with paroxysmal AF with RVR - symptomatic. Now converted to sinus bradycardia. Normotensive but, still feels lightheaded when she stands up. Her work up is thus far notable for a 2 gram drop in Hgb in the past couple of weeks. MCV is wnl. We will check stool for blood with guaiac card and I will send order for retic count. We are awaiting results of CMP. First troponin is wnl.   Chart review shows that the patient had an unremarkable TTE last month. However, she has not had a functional study. This is probably indicated. Plan for observation admission discussed with the patient and her daughter.   Case discussed with Dr. Sharl Ma at [phone] and he has accepted the patient for observation admission and requested temp admission orders to the Tele Unit.     Brandt Loosen, MD 07/10/13 [phone]

## 2013-07-10 NOTE — Consult Note (Addendum)
CARDIOLOGY CONSULT NOTE   Patient ID: Erica Carroll MRN: [id number], DOB/AGE: 1922-03-05   Admit date: 07/10/2013 Date of Consult: 07/10/2013   Primary Physician: Nadean Corwin, MD Primary Cardiologist: None  Pt. Profile  Erica Carroll is an 79 year old female with recent TIA 2 wks ago who presented to ED this morning complaining of dizziness and weakness, was in paroxysmal AFib in ambulance.   Problem List  Past Medical History  Diagnosis Date  . TIA (transient ischemic attack)     a. with fall 05/2013 -> neg head CT and MRI/MRA;  b. Plavix started.  . A-fib     a. Dx 06/2013;  b. 06/2013 Echo:  EF 55-60%, no reg wma, mild AI, mod dil LA.    Past Surgical History  Procedure Laterality Date  . Appendectomy      Allergies  No Known Allergies  HPI   Erica Carroll is a healthy 79 year old female who was recently D/C from [facility] 2 wks ago with a TIA. At that time she presented to the ED with 20 minutes aphasia, having trouble both speaking and comprehending speech. She had no other focal deficits. She had an apparent episode of PAF in the ambulance on the way to the hospital at that time with HR 130s, but no afib on monitor in hospital. She did not feel any palps at this time. During hospitalization she had an echo (EF 55-60%, no WMAs), carotid duplex (bilateral 0-39% ICA), head MRI/MRA which was normal, and a head CTA with no acute findings. She was diagnosed with a TIA and D/C on plavix on 06/27/13.   She had been doing well for about 1 week post D/C, then started to feel intermittent dizziness and indigestion for the past week. Last night she initially had some indigestion after dinner. She was unable to sleep because of this so she sat up in bed and had a soda. She belched several times after this, and after a few minutes became dizzy. She felt weak ,diaphoretic, and clammy, prompting her son to call 911. At some point while she was sitting in bed she felt palps located  under her left breast along with some discomfort. Unsure if palps were present at the start of the event or not. The left sided chest discomfort eased quickly and she has not had any other CP since. In the ambulance she was again in AFib, now with RVR, at a rate btwn 120-170s. She briefly went into the 190s. She again spontaneously converted prior to ED arrival.   Upon arrival to the ED she was in NSR. CXR showed borderline cardiomegaly along with a new nondisplaced left post 6th rib fracture (of note, she stumbled and fell onto a boulder while walking around [place] over Labor Day weekend). Troponins have been neg x2. H&H was decreased from 13.2/36.6 at last visit to 11.5/32 today. She denies any bloody stools, hematemesis, hematuria, or excessive bleeding. Also hypokalemic at 3.2, down from 4.8 two wks ago. She was started on Cardizem 30 mg Q6hrs. Upon speaking with the daughter today she states that pt has been somewhat more fatigued and weak over past 1-2 years than usual.   Inpatient Medications  . clopidogrel  75 mg Oral Q breakfast  . diltiazem  30 mg Oral Q6H  . enoxaparin (LOVENOX) injection  40 mg Subcutaneous Q24H  . pantoprazole  40 mg Oral Q1200  . simethicone  80 mg Oral QID  . sodium chloride  3 mL Intravenous Q12H  . sodium chloride  3 mL Intravenous Q12H   Family History  Noncontributory for early CAD   Social History History   Social History  . Marital Status: Married    Spouse Name: N/A    Number of Children: N/A  . Years of Education: N/A   Occupational History  . Not on file.   Social History Main Topics  . Smoking status: Never Smoker   . Smokeless tobacco: Not on file  . Alcohol Use: No  . Drug Use: No  . Sexual Activity: Not on file   Other Topics Concern  . Not on file   Social History Narrative   Lives in [place] with husband.  Uses Cane to get around.    Review of Systems  General:  No chills, fever, night sweats or weight changes.    Cardiovascular:  +++ lightheadedness/dizziness, and palpitations.  No chest pain, dyspnea on exertion, edema, orthopnea, paroxysmal nocturnal dyspnea. Respiratory: No cough, dyspnea Urologic: No hematuria. Abdominal:   No bright red blood per rectum, melena, or hematemesis. All other systems reviewed and are otherwise negative except as noted above.  Physical Exam  Blood pressure 132/47, pulse 52, temperature 97.4 F (36.3 C), temperature source Oral, resp. rate 18, weight 135 lb (61.236 kg), SpO2 100.00%.  General: Pleasant, NAD HEENT:  HOH, otw nl. Psych: Normal affect. Neuro: Alert and oriented X 3. Moves all extremities spontaneously. Neck: Supple without bruits or JVD. Lungs:  Resp regular and unlabored, CTA. Heart: RRR no s3, s4, or murmurs. Abdomen: Soft, non-tender, non-distended, BS + x 4.  Extremities: No edema. DP/PT/Radials 2+ and equal bilaterally.  Labs   Recent Labs  07/10/13 1020  TROPONINI <0.30   Lab Results  Component Value Date   WBC 6.6 07/10/2013   HGB 11.5* 07/10/2013   HCT 32.0* 07/10/2013   MCV 85.3 07/10/2013   PLT 156 07/10/2013     Recent Labs Lab 07/10/13 0714  NA 136  K 3.2*  CL 103  CO2 20  BUN 11  CREATININE 0.84  CALCIUM 8.4  PROT 6.2  BILITOT 0.3  ALKPHOS 55  ALT 8  AST 13  GLUCOSE 77   Lab Results  Component Value Date   CHOL 131 06/26/2013   HDL 50 06/26/2013   LDLCALC 60 06/26/2013   TRIG 103 06/26/2013   Radiology/Studies  Dg Chest Port 1 View  07/10/2013   *RADIOLOGY REPORT*  Clinical Data: Cardiac dysrhythmia, palpitations.  PORTABLE CHEST - 1 VIEW  Comparison: None available at time of study interpretation.  Findings: Cardiac silhouette appears upper limits of normal, mediastinal silhouette is not suspicious. Mild chronic interstitial changes without pleural effusions or focal consolidations.  No pneumothorax.  Pulmonary vasculature unremarkable.  Multiple EKG lines overlay the patient and could obscure underlying  subtle pathology. Nondisplaced left posterior sixth rib fracture may be acute.  IMPRESSION: Borderline cardiomegaly with very mild chronic interstitial changes, no superimposed acute pulmonary process.  Nondisplaced left posterior sixth rib fracture may be acute.   Original Report Authenticated By: Awilda Metro   ECG  NSR with RBBB and LAFB. HR 69 bpm, no acute st/t changes.  Tele - rsr with a brief run of PAT.  ASSESSMENT AND PLAN  Erica Carroll is an 79 year old female with TIA two wks ago who presented to ED for dizziness, weakness, and palps this morning. Reportedly AFib with RVR in ambulance but NSR since arriving at hospital. This is the 2nd episode  of PAF noted by EMS in the past 2 weeks.   1. Paroxysmal AFib: She has had 2 documented episodes of PAF, both by EMS, though no strips are available to review at this time. Currently in NSR, but will need baseline rate control 2/2 RVR when she is in AFib. Started on Cardizem 30 mg Q6 for rate control. Her HR was 70 bpm prior to Cardizem, has now been running in mid-low 50s. Rec ambulating with HR monitoring today. As long as HR increases appropriately, rec transition to 120 mg PO QD tomo morning. CHA2DS2VASc = 5, so will also need anticoagulation. Though she had recent [place], do not believe she has abnormal fall risk. Active at home gardening, baking, and cleaning without any falls, though dtr notes that she has been unsteady at times.  Occ dizziness, she is careful to avoid quick movements. She does not have any pharmacy coverage, which might limit our options to coumadin.  She also does not drive and her husband seldom drives 2/2 worsening vision - thus outpt INR may prove difficult unless she qualifies for [facility].  Will ask CM to see re: possible coverage for NOAC and also obtain strips from [facility] to ensure that documented rhythm was afib.     2. TIA: On plavix. Will D/C if we start anticoagulation. Statin not nec 2/2  LDL <70, essentially normal carotid dopplers in an 79 year old.   3. GERD: IM started pt on Protonix yest.  4. Anemia: Hgb dropped from 13.2 last hospitalization two wks ago to 11.5 today. No hematuria, BRBPR, melena, or excessive bleeding. IM following. Stool guaiac test ordered.  Will need f/u once anticoagulation ordered.  5. Hypokalemia: 3.2. IM is replacing.   Signed, Nicolasa Ducking, NP 07/10/2013, 2:24 PM As above, patient seen and examined. Briefly she is an 79 year old female with a recent TIA who we are asked to evaluate for atrial fibrillation. Patient recently admitted following an episode of dysarthria. By report she had an episode of atrial fibrillation in the ambulance prior to arrival. Echocardiogram showed normal LV function. MRI was normal. She was discharged on Plavix. This morning she felt indigestion associated with belching. She then developed weakness and dizziness. She felt palpitations under her left breast. EMS was called and apparently she also again had atrial fibrillation.  She apparently converted to sinus rhythm in the ED. Cardiology is now asked to evaluate. Electrocardiogram shows sinus rhythm, right bundle branch block and left anterior fascicular block. Given history of TIA and multiple embolic risk factors patient will need anticoagulation. I will begin IV heparin for now. Case management to review to see if there can be assistance with new oral anticoagulant agent. If not we will begin Coumadin. Check TSH. We will obtain rhythm strips from EMS for documentation of atrial fibrillation. Begin Cardizem 30 mg every 6 hours and follow closely on telemetry. Her heart rate is in the high 50s in sinus rhythm and she was noted to have a right bundle branch block and left anterior fascicular block. We need to follow closely for bradycardia. DC plavix Erica Carroll

## 2013-07-10 NOTE — ED Notes (Signed)
Attempted to draw blood for labs. One stick unsuccessful.

## 2013-07-10 NOTE — H&P (Addendum)
PCP:   Nadean Corwin, MD   Chief Complaint:  Chest pain with palpitations  HPI: 79 year old female who was recently discharged from [facility] after she was treated for TIA, patient was sent home on Plavix. There was also question of A. fib which was not confirmed. She also had an echocardiogram which showed EF of 55-60%. As per patient over the past one week she has had lot of reflux, and has felt dizzy. This morning also patient felt very dizzy which she attributed to her vestibular problems, she also started to have fluttering in the left side of chest under the breast along with discomfort. She did not have nausea or vomiting. She did not pass out. There was no radiation of the pain. Patient called EMS, she was found to be in A. fib with RVR with heart rate as high as 190s but mainly fluctuating between 120-170 range per EMS. Patient also was noted to be pale and diaphoretic appearing ,en  route patient converted back to normal sinus rhythm. In the ED patient was found to be mildly hypotensive with systolic blood pressure around low 100's. Patient also was found to be mildly anemic with hemoglobin dropping to 11.5. The last hemoglobin and on 06/27/2013 was 13.2. Patient denies chest pain at this time, no shortness of breath. She denies fever no dysuria urgency or frequency of urination. Patient otherwise is relatively healthy with no significant medical problems. Patient was found to have low potassium in the ED.  Allergies:  No Known Allergies    Past Medical History  Diagnosis Date  . Stroke     possibly    Past Surgical History  Procedure Laterality Date  . Appendectomy      Prior to Admission medications   Medication Sig Start Date End Date Taking? Authorizing Provider  Cholecalciferol (VITAMIN D PO) Take 1 tablet by mouth daily.   Yes Historical Provider, MD  clopidogrel (PLAVIX) 75 MG tablet Take 1 tablet (75 mg total) by mouth daily with breakfast. 06/27/13  Yes  Penny Pia, MD  GLUCOSAMINE-CHONDROITIN PO Take 1 tablet by mouth daily.   Yes Historical Provider, MD  vitamin B-12 (CYANOCOBALAMIN) 100 MCG tablet Take 50 mcg by mouth daily.   Yes Historical Provider, MD    Social History:  reports that she has never smoked. She does not have any smokeless tobacco history on file. She reports that she does not drink alcohol or use illicit drugs.  Family history is significant for coronary artery disease patient's both parents and most of siblings had heart problems, patient is only survivor of all siblings   All the positives are listed in BOLD  Review of Systems:  HEENT: Headache, blurred vision, runny nose, sore throat Neck: Hypothyroidism, hyperthyroidism,,lymphadenopathy Chest : Shortness of breath, history of COPD, Asthma Heart : Chest pain, history of coronary arterey disease GI:  Nausea, vomiting, diarrhea, constipation, GERD GU: Dysuria, urgency, frequency of urination, hematuria Neuro: Stroke, seizures, syncope, TIA  Psych: Depression, anxiety, hallucinations   Physical Exam: Blood pressure 102/71, pulse 53, temperature 97.9 F (36.6 C), temperature source Oral, resp. rate 14, SpO2 100.00%. Constitutional:   Patient is a well-developed and well-nourished female* in no acute distress and cooperative with exam. Head: Normocephalic and atraumatic Mouth: Mucus membranes moist Eyes: PERRL, EOMI, conjunctivae normal Neck: Supple, No Thyromegaly Cardiovascular: RRR, S1 normal, S2 normal Pulmonary/Chest: CTAB, no wheezes, rales, or rhonchi Abdominal: Soft. Non-tender, non-distended, bowel sounds are normal, no masses, organomegaly, or guarding present.  Neurological:  A&O x3, Strenght is normal and symmetric bilaterally, cranial nerve II-XII are grossly intact, no focal motor deficit, sensory intact to light touch bilaterally.  Extremities : No Cyanosis, Clubbing or Edema   Labs on Admission:  Results for orders placed during the hospital  encounter of 07/10/13 (from the past 48 hour(s))  CBC WITH DIFFERENTIAL     Status: Abnormal   Collection Time    07/10/13  7:14 AM      Result Value Range   WBC 6.6  4.0 - 10.5 K/uL   RBC 3.75 (*) 3.87 - 5.11 MIL/uL   Hemoglobin 11.5 (*) 12.0 - 15.0 g/dL   HCT 16.1 (*) 09.6 - 04.5 %   MCV 85.3  78.0 - 100.0 fL   MCH 30.7  26.0 - 34.0 pg   MCHC 35.9  30.0 - 36.0 g/dL   RDW 40.9  81.1 - 91.4 %   Platelets 156  150 - 400 K/uL   Neutrophils Relative % 61  43 - 77 %   Neutro Abs 4.0  1.7 - 7.7 K/uL   Lymphocytes Relative 27  12 - 46 %   Lymphs Abs 1.8  0.7 - 4.0 K/uL   Monocytes Relative 10  3 - 12 %   Monocytes Absolute 0.7  0.1 - 1.0 K/uL   Eosinophils Relative 2  0 - 5 %   Eosinophils Absolute 0.1  0.0 - 0.7 K/uL   Basophils Relative 0  0 - 1 %   Basophils Absolute 0.0  0.0 - 0.1 K/uL  COMPREHENSIVE METABOLIC PANEL     Status: Abnormal   Collection Time    07/10/13  7:14 AM      Result Value Range   Sodium 136  135 - 145 mEq/L   Potassium 3.2 (*) 3.5 - 5.1 mEq/L   Chloride 103  96 - 112 mEq/L   CO2 20  19 - 32 mEq/L   Glucose, Bld 77  70 - 99 mg/dL   BUN 11  6 - 23 mg/dL   Creatinine, Ser 7.82  0.50 - 1.10 mg/dL   Calcium 8.4  8.4 - 95.6 mg/dL   Total Protein 6.2  6.0 - 8.3 g/dL   Albumin 3.4 (*) 3.5 - 5.2 g/dL   AST 13  0 - 37 U/L   ALT 8  0 - 35 U/L   Alkaline Phosphatase 55  39 - 117 U/L   Total Bilirubin 0.3  0.3 - 1.2 mg/dL   GFR calc non Af Amer 60 (*) >90 mL/min   GFR calc Af Amer 70 (*) >90 mL/min   Comment: (NOTE)     The eGFR has been calculated using the CKD EPI equation.     This calculation has not been validated in all clinical situations.     eGFR's persistently <90 mL/min signify possible Chronic Kidney     Disease.  LIPASE, BLOOD     Status: None   Collection Time    07/10/13  7:14 AM      Result Value Range   Lipase 45  11 - 59 U/L  POCT I-STAT TROPONIN I     Status: None   Collection Time    07/10/13  7:43 AM      Result Value Range    Troponin i, poc 0.04  0.00 - 0.08 ng/mL   Comment 3            Comment: Due to the release kinetics of cTnI,  a negative result within the first hours     of the onset of symptoms does not rule out     myocardial infarction with certainty.     If myocardial infarction is still suspected,     repeat the test at appropriate intervals.    Radiological Exams on Admission: Dg Chest Port 1 View  07/10/2013   *RADIOLOGY REPORT*  Clinical Data: Cardiac dysrhythmia, palpitations.  PORTABLE CHEST - 1 VIEW  Comparison: None available at time of study interpretation.  Findings: Cardiac silhouette appears upper limits of normal, mediastinal silhouette is not suspicious. Mild chronic interstitial changes without pleural effusions or focal consolidations.  No pneumothorax.  Pulmonary vasculature unremarkable.  Multiple EKG lines overlay the patient and could obscure underlying subtle pathology. Nondisplaced left posterior sixth rib fracture may be acute.  IMPRESSION: Borderline cardiomegaly with very mild chronic interstitial changes, no superimposed acute pulmonary process.  Nondisplaced left posterior sixth rib fracture may be acute.   Original Report Authenticated By: Awilda Metro    Assessment/Plan Principal Problem:   Paroxysmal atrial fibrillation Active Problems:   Hypokalemia   GERD (gastroesophageal reflux disease)  Paroxysmal atrial fibrillation Patient was found to be in A. fib and converted back to normal sinus rhythm, at this time EKG in the ED shows normal sinus rhythm with left anterior fascicular block and right branch block.we'll start the patient on Cardizem 30 mg by mouth every 6 hours and continue Plavix. Her CHADS2 score is 3, based on recent history of TIA. Will get cardiology consult for further management and followup as outpatient after discharge. She may be a candidate for anticoagulation with Coumadin or new or anticoagulants. We'll defer this to cardiology.  Chest  pain Likely associated with atrial fibrillation, first set of cardiac enzymes is negative. We'll obtain 3 sets of troponin and monitor under telemetry.  Hypokalemia Low potassium could have been trigger for putting the patient in A. Fib, will check serum magnesium level, and replace the potassium. We'll check BMP in the morning  Anemia Patient's hemoglobin has dropped from 13.2-11.5, we'll obtain stool for occult blood. Patient did not have black colored stools or blood in the stool. Follow CBC in the morning  GERD Patient has been complaining of reflux symptoms going on for past few months, she also was recently started on Plavix which could have been irritant for the stomach. I will start the patient on Protonix. Patient is on Plavix for TIA, so should not interact with Protonix.  DVT prophylaxis Lovenox  Code status: full code   Family discussion: discussed with patient's husband, daughter at bedside    Time Spent on Admission: 80 min  [facility] Pager: [phone] 07/10/2013, 9:32 AM  If 7PM-7AM, please contact night-coverage  [email or web address]  Password [email or web address]

## 2013-07-11 ENCOUNTER — Encounter (HOSPITAL_COMMUNITY): Payer: Self-pay | Admitting: Internal Medicine

## 2013-07-11 DIAGNOSIS — R001 Bradycardia, unspecified: Secondary | ICD-10-CM

## 2013-07-11 DIAGNOSIS — I498 Other specified cardiac arrhythmias: Secondary | ICD-10-CM

## 2013-07-11 DIAGNOSIS — G459 Transient cerebral ischemic attack, unspecified: Secondary | ICD-10-CM

## 2013-07-11 DIAGNOSIS — I4891 Unspecified atrial fibrillation: Secondary | ICD-10-CM

## 2013-07-11 HISTORY — DX: Bradycardia, unspecified: R00.1

## 2013-07-11 LAB — CBC
HCT: 34.1 % — ABNORMAL LOW (ref 36.0–46.0)
Hemoglobin: 12.1 g/dL (ref 12.0–15.0)
MCHC: 35.5 g/dL (ref 30.0–36.0)
Platelets: 151 10*3/uL (ref 150–400)
WBC: 6.4 10*3/uL (ref 4.0–10.5)

## 2013-07-11 LAB — COMPREHENSIVE METABOLIC PANEL
ALT: 8 U/L (ref 0–35)
Calcium: 8.6 mg/dL (ref 8.4–10.5)
Chloride: 109 mEq/L (ref 96–112)
GFR calc Af Amer: 67 mL/min — ABNORMAL LOW (ref >90)
Glucose, Bld: 88 mg/dL (ref 70–99)
Sodium: 137 mEq/L (ref 135–145)
Total Bilirubin: 0.4 mg/dL (ref 0.3–1.2)
Total Protein: 6 g/dL (ref 6.0–8.3)

## 2013-07-11 LAB — TSH: TSH: 0.575 u[IU]/mL (ref 0.350–4.500)

## 2013-07-11 LAB — TROPONIN I: Troponin I: <0.3 ng/mL (ref <0.30)

## 2013-07-11 LAB — HEPARIN LEVEL (UNFRACTIONATED): Heparin Unfractionated: 0.58 IU/mL (ref 0.30–0.70)

## 2013-07-11 MED ORDER — PINDOLOL 5 MG PO TABS
2.5000 mg | ORAL_TABLET | Freq: Two times a day (BID) | ORAL | Status: DC
  Administered 2013-07-11 – 2013-07-14 (×7): 2.5 mg via ORAL
  Filled 2013-07-11 (×10): qty 1

## 2013-07-11 MED ORDER — WARFARIN - PHARMACIST DOSING INPATIENT: Freq: Every day | Status: DC

## 2013-07-11 MED ORDER — COUMADIN BOOK
Freq: Once | Status: AC
  Administered 2013-07-12: 11:00:00
  Filled 2013-07-11: qty 1

## 2013-07-11 MED ORDER — WARFARIN SODIUM 2.5 MG PO TABS
2.5000 mg | ORAL_TABLET | Freq: Once | ORAL | Status: AC
  Administered 2013-07-11: 2.5 mg via ORAL
  Filled 2013-07-11 (×2): qty 1

## 2013-07-11 MED ORDER — WARFARIN VIDEO
Freq: Once | Status: AC
  Administered 2013-07-11: 10:00:00

## 2013-07-11 NOTE — Care Management Note (Signed)
Page 2 of 2   07/13/2013     4:48:42 PM   CARE MANAGEMENT NOTE 07/13/2013  Patient:  Erica Carroll, Erica Carroll   Account Number:  [id number]  Date Initiated:  07/10/2013  Documentation initiated by:  Donn Pierini  Subjective/Objective Assessment:   Pt admitted with afib     Action/Plan:   PTA pt lived at home with spouse   Anticipated DC Date:  07/12/2013   Anticipated DC Plan:  HOME W HOME HEALTH SERVICES      DC Planning Services  CM consult  MATCH Program      [facility] Choice  HOME HEALTH   Choice offered to / List presented to:  C-4 Adult Children        HH arranged  HH-1 RN      [facility] agency  [facility]   Status of service:  In process, will continue to follow Medicare Important Message given?   (If response is "NO", the following Medicare IM given date fields will be blank) Date Medicare IM given:   Date Additional Medicare IM given:    Discharge Disposition:  HOME W HOME HEALTH SERVICES  Per UR Regulation:  Reviewed for med. necessity/level of care/duration of stay  If discussed at Long Length of Stay Meetings, dates discussed:    Comments:  daughterRowe Pavy-  07/13/13- 1645- Donn Pierini RN, BSN [phone] D/C planning for am- orders in for HH-RN for Lovenox and PT/INR- spoke with pt's daughter Consuella Lose- and gave list for [facility]- per choice would like to use- [facility]- referral called to Hilda Lias with [facility] and services can begin on Sunday 07/15/13- pt will go home with Lovenox will use MATCH to assist. Match letter given to pt's daughter along with prescription for Lovenox- per [facility] outpt pharmacy pt can use to fill 1st fill with MATCH and file the refill if need but refill would be an out of pocket expense and not through [facility] if needed. Spoke with daughter - to inform her of this.  07/11/13- 1045- Donn Pierini RN, BSN [phone] Spoke with pt, spouse and daughter at bedside this am regarding Eliquis vs coumadin option- as pt does not have medication  benefits part D at this time- Eliquis would be expensive out of pocket- Daughter does plan to research part D and enroll pt during open enrollment with Medicare and benefits for medications would be available in Jan. 2015- it could be that pt could start on coumadin at discharge with the option to change over to Eliquis at the end of the year with the drug company 30 day free card and then when Jan. gets here she would have benefits to change over. This could be discussed between pt and her cardiologist. At this time it is probably best to go with coumadin - pt could be set up with the coumadin clinic for f/u- per daughter pt has been off balance at home- will have MD order PT eval- pt may benefit from [facility]- and with Lovenox bridge needed might need HH-RN at first for INR checks. As pt does not have medication coverage -might be eligible for assistance with Lovenox through - MATCH program-  NCM to cont. to follow. Discussed HH options with pt and family.  07/10/13- 1600- Donn Pierini RN, BSN [phone] Referral received for possible eliquis- in to speak with pt and family (spouse,son and daughter(TC))- confirmed that pt has never signed up for part D of Medicare and does not have any other medication coverage- daughter to  be in room in am- plan is for this CM to return to speak with pt and daughter in am about options- pt could go home on 30 day free card- and apply for assistance but not sure if pt would be approved- other option would be to sign up for part D with Medicare but this would not activate until Jan. - she could start on coumadin and then change to Eliquis once she has insurance coverage for medications- will discuss all this in am with pt and family.

## 2013-07-11 NOTE — Progress Notes (Signed)
Chart reviewed.  [facility] PROGRESS NOTE  Erica Carroll:[id number] DOB: July 31, 1922 DOA: 07/10/2013 PCP: Nadean Corwin, MD  Assessment/Plan:  Principal Problem:   Paroxysmal atrial fibrillation:  Continue heparin.  Patient has no prescription coverage.  Warfarin the best option, which I have started today.  Cardiology adjusting meds, as patient mildly bradycardic today. Active Problems:   TIA (transient ischemic attack)   GERD (gastroesophageal reflux disease)   Sinus bradycardia  HPI/Subjective: Feels better.  No bleeding  Objective: Filed Vitals:   07/11/13 1320  BP: 165/74  Pulse: 55  Temp: 97.4 F (36.3 C)  Resp: 16    Intake/Output Summary (Last 24 hours) at 07/11/13 1615 Last data filed at 07/11/13 0440  Gross per 24 hour  Intake      0 ml  Output    300 ml  Net   -300 ml   Filed Weights   07/10/13 0948  Weight: 61.236 kg (135 lb)   Tele:  Sinus brady  Exam:   Cardiovascular: regular, slow no MGR  Respiratory: CTA without WRR  Abdomen: S, nt, nd  Ext:  No CCE  Data Reviewed: Basic Metabolic Panel:  Recent Labs Lab 07/10/13 0714 07/11/13 0512  NA 136 137  K 3.2* 4.4  CL 103 109  CO2 20 19  GLUCOSE 77 88  BUN 11 10  CREATININE 0.84 0.87  CALCIUM 8.4 8.6  MG  --  2.2   Liver Function Tests:  Recent Labs Lab 07/10/13 0714 07/11/13 0512  AST 13 14  ALT 8 8  ALKPHOS 55 46  BILITOT 0.3 0.4  PROT 6.2 6.0  ALBUMIN 3.4* 3.2*    Recent Labs Lab 07/10/13 0714  LIPASE 45   No results found for this basename: AMMONIA,  in the last 168 hours CBC:  Recent Labs Lab 07/10/13 0714 07/11/13 0512  WBC 6.6 6.4  NEUTROABS 4.0  --   HGB 11.5* 12.1  HCT 32.0* 34.1*  MCV 85.3 86.5  PLT 156 151   Cardiac Enzymes:  Recent Labs Lab 07/10/13 1020 07/10/13 1533 07/10/13 2250  TROPONINI <0.30 <0.30 <0.30   BNP (last 3 results) No results found for this basename: PROBNP,  in the last 8760 hours CBG: No results  found for this basename: GLUCAP,  in the last 168 hours  No results found for this or any previous visit (from the past 240 hour(s)).   Studies: Dg Chest Port 1 View  07/10/2013   *RADIOLOGY REPORT*  Clinical Data: Cardiac dysrhythmia, palpitations.  PORTABLE CHEST - 1 VIEW  Comparison: None available at time of study interpretation.  Findings: Cardiac silhouette appears upper limits of normal, mediastinal silhouette is not suspicious. Mild chronic interstitial changes without pleural effusions or focal consolidations.  No pneumothorax.  Pulmonary vasculature unremarkable.  Multiple EKG lines overlay the patient and could obscure underlying subtle pathology. Nondisplaced left posterior sixth rib fracture may be acute.  IMPRESSION: Borderline cardiomegaly with very mild chronic interstitial changes, no superimposed acute pulmonary process.  Nondisplaced left posterior sixth rib fracture may be acute.   Original Report Authenticated By: Awilda Metro    Scheduled Meds: . coumadin book   Does not apply Once  . pantoprazole  40 mg Oral Q1200  . pindolol  2.5 mg Oral BID  . sodium chloride  3 mL Intravenous Q12H  . sodium chloride  3 mL Intravenous Q12H  . warfarin  2.5 mg Oral ONCE-1800  . warfarin   Does not apply Once  .  Warfarin - Pharmacist Dosing Inpatient   Does not apply q1800   Continuous Infusions: . 0.9 % NaCl with KCl 20 mEq / L 75 mL/hr at 07/10/13 2351  . heparin 800 Units/hr (07/11/13 0140)   Time spent: 35 min  Arlind Klingerman L  [facility] Pager [phone]. If 7PM-7AM, please contact night-coverage at [email or web address], password [facility] 07/11/2013, 4:15 PM  LOS: 1 day

## 2013-07-11 NOTE — Progress Notes (Signed)
Utilization review completed.  

## 2013-07-11 NOTE — Progress Notes (Signed)
        Patient Name: Erica Carroll     SUBJECTIVE:  Admitted with afib>>NSR and with TIA 2 weeks ago, started onanticoagulation  Await input re cost of NOACs  Afib documented to >>170-90   Also with sinus into 40's and 50s Past Medical History  Diagnosis Date  . TIA (transient ischemic attack)     a. with fall 05/2013 -> neg head CT and MRI/MRA;  b. Plavix started.  . A-fib     a. Dx 06/2013;  b. 06/2013 Echo:  EF 55-60%, no reg wma, mild AI, mod dil LA.    Scheduled Meds:  Scheduled Meds: . diltiazem  30 mg Oral Q6H  . pantoprazole  40 mg Oral Q1200  . sodium chloride  3 mL Intravenous Q12H  . sodium chloride  3 mL Intravenous Q12H   Continuous Infusions: . 0.9 % NaCl with KCl 20 mEq / L 75 mL/hr at 07/10/13 2351  . heparin 800 Units/hr (07/11/13 0140)    PHYSICAL EXAM Filed Vitals:   07/10/13 0948 07/10/13 1751 07/10/13 2014 07/11/13 0444  BP: 132/47 148/56 141/46 147/74  Pulse: 52  60 54  Temp: 97.4 F (36.3 C)  98 F (36.7 C) 97.6 F (36.4 C)  TempSrc: Oral     Resp: 18  20 18   Weight: 135 lb (61.236 kg)     SpO2: 100%  100% 94%   Well developed and nourished in no acute distress HENT normal Neck supple with JVP-flat Clear Regular rate and rhythm, +S4 Abd-soft with active BS No Clubbing cyanosis edema Skin-warm and dry A & Oriented  Grossly normal sensory and motor function  TELEMETRY: Reviewed telemetry pt in sinus >40s   Intake/Output Summary (Last 24 hours) at 07/11/13 0749 Last data filed at 07/11/13 0440  Gross per 24 hour  Intake      0 ml  Output    300 ml  Net   -300 ml    LABS: Basic Metabolic Panel:  Recent Labs Lab 07/10/13 0714 07/11/13 0512  NA 136 137  K 3.2* 4.4  CL 103 109  CO2 20 19  GLUCOSE 77 88  BUN 11 10  CREATININE 0.84 0.87  CALCIUM 8.4 8.6  MG  --  2.2   Cardiac Enzymes:  Recent Labs  07/10/13 1020 07/10/13 1533 07/10/13 2250  TROPONINI <0.30 <0.30 <0.30   CBC:  Recent Labs Lab  07/10/13 0714 07/11/13 0512  WBC 6.6 6.4  NEUTROABS 4.0  --   HGB 11.5* 12.1  HCT 32.0* 34.1*  MCV 85.3 86.5  PLT 156 151   PROTIME: No results found for this basename: LABPROT, INR,  in the last 72 hours Liver Function Tests:  Recent Labs  07/10/13 0714 07/11/13 0512  AST 13 14  ALT 8 8  ALKPHOS 55 46  BILITOT 0.3 0.4  PROT 6.2 6.0  ALBUMIN 3.4* 3.2*    Recent Labs  07/10/13 0714  LIPASE 45     Recent Labs  07/10/13 1020  TSH 0.948      ASSESSMENT AND PLAN:  Principal Problem:   Paroxysmal atrial fibrillation Active Problems:   TIA (transient ischemic attack)   GERD (gastroesophageal reflux disease)   Sinus bradycardia hypokalemia resolved Await input from case manager>> re NOAC Will change CCB>>ISA betablocker for bradycardia She may come to pacing Will change to admission as we try to make sure excessvie bradycardia does not ensue  Signed, Sherryl Manges MD  07/11/2013

## 2013-07-11 NOTE — Progress Notes (Signed)
ANTICOAGULATION CONSULT NOTE - Follow Up Consult  Pharmacy Consult for heparin / warfarin Indication: atrial fibrillation  No Known Allergies  Patient Measurements: Weight: 135 lb (61.236 kg)   Vital Signs: Temp: 97.6 F (36.4 C) (10/15 0444) BP: 147/74 mmHg (10/15 0444) Pulse Rate: 54 (10/15 0444)  Labs:  Recent Labs  07/10/13 0714 07/10/13 1020 07/10/13 1533 07/10/13 2250 07/11/13 0100 07/11/13 0512  HGB 11.5*  --   --   --   --  12.1  HCT 32.0*  --   --   --   --  34.1*  PLT 156  --   --   --   --  151  HEPARINUNFRC  --   --   --   --  0.90* 0.58  CREATININE 0.84  --   --   --   --  0.87  TROPONINI  --  <0.30 <0.30 <0.30  --   --     The CrCl is unknown because both a height and weight (above a minimum accepted value) are required for this calculation.   Medical History: Past Medical History  Diagnosis Date  . TIA (transient ischemic attack)     a. with fall 05/2013 -> neg head CT and MRI/MRA;  b. Plavix started.  . A-fib     a. Dx 06/2013;  b. 06/2013 Echo:  EF 55-60%, no reg wma, mild AI, mod dil LA.  . Sinus bradycardia 07/11/2013    Medications:  Prescriptions prior to admission  Medication Sig Dispense Refill  . Cholecalciferol (VITAMIN D PO) Take 1 tablet by mouth daily.      . clopidogrel (PLAVIX) 75 MG tablet Take 1 tablet (75 mg total) by mouth daily with breakfast.  30 tablet  0  . GLUCOSAMINE-CHONDROITIN PO Take 1 tablet by mouth daily.      . vitamin B-12 (CYANOCOBALAMIN) 100 MCG tablet Take 50 mcg by mouth daily.        Assessment: 79 yo F to started on heparin for afib w/ RVR in ambulance but in NSR since arriving at the hospitial.  She had recent TIA 2 weeks ago with fall 05/2013 -> neg head CT and MRI/MRA, plavix was started.  CHA2DS2VASc = 5 Anemia: Hgb dropped from 13.2 last hospitalization two wks ago to 11.5. Now Stable.  No hematuria, BRBPR, melena, or excessive bleeding. IM following. Stool guaiac test negative.  Plavix stopped with  starting full anticoagulation.   CM to explored possible coverage for NOAC- pt does not have part D coverage and will now be started on Coumadin.  She is considering filling out paperwork for part D which will not start until January and discussion regarding NOAC can restart then as pt does not drive and may have difficulties getting INRs checked. Heparin drip 800 uts/hr HL 0.58 at goal. Begin low dose Coumaidn as she is elderly and may be sensitive.    Goal of Therapy:  Heparin level 0.3-0.7 units/ml Monitor platelets by anticoagulation protocol: Yes   Plan:  Continue heparin drip to 800 units/hr Coumadin 2.5mg  x1 Daily CBC, HL, INR  Leota Sauers Pharm.D. CPP, BCPS Clinical Pharmacist [phone] 07/11/2013 10:01 AM

## 2013-07-11 NOTE — Progress Notes (Signed)
ANTICOAGULATION CONSULT NOTE - Follow Up Consult  Pharmacy Consult for heparin Indication: atrial fibrillation  No Known Allergies  Patient Measurements: Weight: 135 lb (61.236 kg)   Vital Signs: Temp: 98 F (36.7 C) (10/14 2014) BP: 141/46 mmHg (10/14 2014) Pulse Rate: 60 (10/14 2014)  Labs:  Recent Labs  07/10/13 0714 07/10/13 1020 07/10/13 1533 07/10/13 2250 07/11/13 0100  HGB 11.5*  --   --   --   --   HCT 32.0*  --   --   --   --   PLT 156  --   --   --   --   HEPARINUNFRC  --   --   --   --  0.90*  CREATININE 0.84  --   --   --   --   TROPONINI  --  <0.30 <0.30 <0.30  --     The CrCl is unknown because both a height and weight (above a minimum accepted value) are required for this calculation.   Medical History: Past Medical History  Diagnosis Date  . TIA (transient ischemic attack)     a. with fall 05/2013 -> neg head CT and MRI/MRA;  b. Plavix started.  . A-fib     a. Dx 06/2013;  b. 06/2013 Echo:  EF 55-60%, no reg wma, mild AI, mod dil LA.    Medications:  Prescriptions prior to admission  Medication Sig Dispense Refill  . Cholecalciferol (VITAMIN D PO) Take 1 tablet by mouth daily.      . clopidogrel (PLAVIX) 75 MG tablet Take 1 tablet (75 mg total) by mouth daily with breakfast.  30 tablet  0  . GLUCOSAMINE-CHONDROITIN PO Take 1 tablet by mouth daily.      . vitamin B-12 (CYANOCOBALAMIN) 100 MCG tablet Take 50 mcg by mouth daily.        Assessment: 79 yo F to start heparin for afib w/ RVR in ambulance but in NSR since arriving at the hospitial.  She had recent TIA 2 weeks ago with fall 05/2013 -> neg head CT and MRI/MRA, plavix was started.  CHA2DS2VASc = 5 Anemia: Hgb dropped from 13.2 last hospitalization two wks ago to 11.5 today. No hematuria, BRBPR, melena, or excessive bleeding. IM following. Stool guaiac test ordered. Will need f/u once anticoagulation ordered.  She was given LMWH 40 mg today at 1128 am. Plavix to be stopped with starting  coumadin.  CM to explore possible coverage for NOAC 2nd pt does not drive and may have difficulties getting INRs checked. Initial heparin level 0.9 units/ml    Goal of Therapy:  Heparin level 0.3-0.7 units/ml Monitor platelets by anticoagulation protocol: Yes   Plan:  Decrease heparin drip to 800 units/hr Check heparin level 6 hours after rate change.  Talbert Cage, Pharm.D. 07/11/2013 1:33 AM

## 2013-07-12 LAB — CBC
Hemoglobin: 13 g/dL (ref 12.0–15.0)
RBC: 4.19 MIL/uL (ref 3.87–5.11)
WBC: 6.1 10*3/uL (ref 4.0–10.5)

## 2013-07-12 LAB — PROTIME-INR
INR: 0.98 (ref 0.00–1.49)
Prothrombin Time: 12.8 seconds (ref 11.6–15.2)

## 2013-07-12 LAB — HEPARIN LEVEL (UNFRACTIONATED): Heparin Unfractionated: 0.52 IU/mL (ref 0.30–0.70)

## 2013-07-12 MED ORDER — WARFARIN VIDEO
Freq: Once | Status: AC
  Administered 2013-07-12: 17:00:00

## 2013-07-12 MED ORDER — PATIENT'S GUIDE TO USING COUMADIN BOOK
Freq: Once | Status: AC
  Filled 2013-07-12: qty 1

## 2013-07-12 MED ORDER — WARFARIN SODIUM 4 MG PO TABS
4.0000 mg | ORAL_TABLET | Freq: Once | ORAL | Status: AC
  Administered 2013-07-12: 4 mg via ORAL
  Filled 2013-07-12: qty 1

## 2013-07-12 NOTE — Progress Notes (Signed)
Patient Name: Erica Carroll     SUBJECTIVE:  Admitted with afib>>NSR and with TIA 2 weeks ago, started onanticoagulation  Await input re cost of NOACs  Afib documented to >>170-90   Also with sinus into 40's and 50s Past Medical History  Diagnosis Date  . TIA (transient ischemic attack)     a. with fall 05/2013 -> neg head CT and MRI/MRA;  b. Plavix started.  . A-fib     a. Dx 06/2013;  b. 06/2013 Echo:  EF 55-60%, no reg wma, mild AI, mod dil LA.  . Sinus bradycardia 07/11/2013    Scheduled Meds:  Scheduled Meds: . pantoprazole  40 mg Oral Q1200  . pindolol  2.5 mg Oral BID  . sodium chloride  3 mL Intravenous Q12H  . sodium chloride  3 mL Intravenous Q12H  . warfarin  4 mg Oral ONCE-1800  . Warfarin - Pharmacist Dosing Inpatient   Does not apply q1800   Continuous Infusions: . 0.9 % NaCl with KCl 20 mEq / L 10 mL/hr at 07/11/13 2213  . heparin 800 Units/hr (07/11/13 0140)    PHYSICAL EXAM Filed Vitals:   07/11/13 2208 07/12/13 0552 07/12/13 1052 07/12/13 1421  BP:  147/58  116/42  Pulse: 60 54 62 55  Temp:  97.9 F (36.6 C)  97.6 F (36.4 C)  TempSrc:  Oral  Oral  Resp:  18  15  Height:  5\' 5"  (1.651 m)    Weight:  132 lb 4.4 oz (60 kg)    SpO2:  100%  100%   Well developed and nourished in no acute distress HENT normal Neck supple with JVP-flat Clear Regular rate and rhythm, +S4 Abd-soft with active BS No Clubbing cyanosis edema Skin-warm and dry A & Oriented  Grossly normal sensory and motor function  TELEMETRY: Reviewed telemetry pt in sinus >50s   Intake/Output Summary (Last 24 hours) at 07/12/13 1431 Last data filed at 07/12/13 1200  Gross per 24 hour  Intake    720 ml  Output      0 ml  Net    720 ml    LABS: Basic Metabolic Panel:  Recent Labs Lab 07/10/13 0714 07/11/13 0512  NA 136 137  K 3.2* 4.4  CL 103 109  CO2 20 19  GLUCOSE 77 88  BUN 11 10  CREATININE 0.84 0.87  CALCIUM 8.4 8.6  MG  --  2.2   Cardiac  Enzymes:  Recent Labs  07/10/13 1020 07/10/13 1533 07/10/13 2250  TROPONINI <0.30 <0.30 <0.30   CBC:  Recent Labs Lab 07/10/13 0714 07/11/13 0512 07/12/13 0500  WBC 6.6 6.4 6.1  NEUTROABS 4.0  --   --   HGB 11.5* 12.1 13.0  HCT 32.0* 34.1* 36.6  MCV 85.3 86.5 87.4  PLT 156 151 161   PROTIME:  Recent Labs  07/12/13 0500  LABPROT 12.8  INR 0.98   Liver Function Tests:  Recent Labs  07/10/13 0714 07/11/13 0512  AST 13 14  ALT 8 8  ALKPHOS 55 46  BILITOT 0.3 0.4  PROT 6.2 6.0  ALBUMIN 3.4* 3.2*    Recent Labs  07/10/13 0714  LIPASE 45     Recent Labs  07/11/13 0512  TSH 0.575      ASSESSMENT AND PLAN:  Principal Problem:   Paroxysmal atrial fibrillation Active Problems:   TIA (transient ischemic attack)   GERD (gastroesophageal reflux disease)   Sinus bradycardia  Without insurance coverage, warfarin is the best option. With recent TIA would recommend overlap x24 hours prior to discharge Case manager looking into LMWH and that would be fine Hopefully will be able to avoid pacig  Signed, Sherryl Manges MD  07/12/2013

## 2013-07-12 NOTE — Progress Notes (Signed)
Chart reviewed.  [facility] PROGRESS NOTE  ELMIRE AMREIN NWG:[id number] DOB: [birth year] DOA: 07/10/2013 PCP: Nadean Corwin, MD  Assessment/Plan:  Principal Problem:   Paroxysmal atrial fibrillation:  Continue heparin.  Patient has no prescription coverage.  Warfarin the best option.  Continue current and await cardiology recs.  HR in the 60s.  Updated son Active Problems:   TIA (transient ischemic attack)   GERD (gastroesophageal reflux disease)  HPI/Subjective: Feels better.  No bleeding  Objective: Filed Vitals:   07/12/13 1052  BP:   Pulse: 62  Temp:   Resp:    No intake or output data in the 24 hours ending 07/12/13 1159 Filed Weights   07/10/13 0948 07/12/13 0552  Weight: 61.236 kg (135 lb) 60 kg (132 lb 4.4 oz)   Tele:  NSR 62  Exam:   Cardiovascular: RRR without MGR  Respiratory: CTA without WRR  Abdomen: S, nt, nd  Ext:  No CCE  Data Reviewed: Basic Metabolic Panel:  Recent Labs Lab 07/10/13 0714 07/11/13 0512  NA 136 137  K 3.2* 4.4  CL 103 109  CO2 20 19  GLUCOSE 77 88  BUN 11 10  CREATININE 0.84 0.87  CALCIUM 8.4 8.6  MG  --  2.2   Liver Function Tests:  Recent Labs Lab 07/10/13 0714 07/11/13 0512  AST 13 14  ALT 8 8  ALKPHOS 55 46  BILITOT 0.3 0.4  PROT 6.2 6.0  ALBUMIN 3.4* 3.2*    Recent Labs Lab 07/10/13 0714  LIPASE 45   No results found for this basename: AMMONIA,  in the last 168 hours CBC:  Recent Labs Lab 07/10/13 0714 07/11/13 0512 07/12/13 0500  WBC 6.6 6.4 6.1  NEUTROABS 4.0  --   --   HGB 11.5* 12.1 13.0  HCT 32.0* 34.1* 36.6  MCV 85.3 86.5 87.4  PLT 156 151 161   Cardiac Enzymes:  Recent Labs Lab 07/10/13 1020 07/10/13 1533 07/10/13 2250  TROPONINI <0.30 <0.30 <0.30   BNP (last 3 results) No results found for this basename: PROBNP,  in the last 8760 hours CBG: No results found for this basename: GLUCAP,  in the last 168 hours  No results found for this or any previous  visit (from the past 240 hour(s)).   Studies: No results found.  Scheduled Meds: . pantoprazole  40 mg Oral Q1200  . pindolol  2.5 mg Oral BID  . sodium chloride  3 mL Intravenous Q12H  . sodium chloride  3 mL Intravenous Q12H  . warfarin  4 mg Oral ONCE-1800  . Warfarin - Pharmacist Dosing Inpatient   Does not apply q1800   Continuous Infusions: . 0.9 % NaCl with KCl 20 mEq / L 10 mL/hr at 07/11/13 2213  . heparin 800 Units/hr (07/11/13 0140)   Time spent: 25 min  Sael Furches L  [facility] Pager [phone]. If 7PM-7AM, please contact night-coverage at [email or web address], password [facility] 07/12/2013, 11:59 AM  LOS: 2 days

## 2013-07-12 NOTE — Progress Notes (Signed)
ANTICOAGULATION CONSULT NOTE - Follow Up Consult  Pharmacy Consult for heparin / warfarin Indication: atrial fibrillation  No Known Allergies  Patient Measurements: Weight: 132 lb 4.4 oz (60 kg)   Vital Signs: Temp: 97.9 F (36.6 C) (10/16 0552) Temp src: Oral (10/16 0552) BP: 147/58 mmHg (10/16 0552) Pulse Rate: 54 (10/16 0552)  Labs:  Recent Labs  07/10/13 0714 07/10/13 1020 07/10/13 1533 07/10/13 2250 07/11/13 0100 07/11/13 0512 07/12/13 0500  HGB 11.5*  --   --   --   --  12.1 13.0  HCT 32.0*  --   --   --   --  34.1* 36.6  PLT 156  --   --   --   --  151 161  LABPROT  --   --   --   --   --   --  12.8  INR  --   --   --   --   --   --  0.98  HEPARINUNFRC  --   --   --   --  0.90* 0.58 0.52  CREATININE 0.84  --   --   --   --  0.87  --   TROPONINI  --  <0.30 <0.30 <0.30  --   --   --     The CrCl is unknown because both a height and weight (above a minimum accepted value) are required for this calculation.   Medical History: Past Medical History  Diagnosis Date  . TIA (transient ischemic attack)     a. with fall 05/2013 -> neg head CT and MRI/MRA;  b. Plavix started.  . A-fib     a. Dx 06/2013;  b. 06/2013 Echo:  EF 55-60%, no reg wma, mild AI, mod dil LA.  . Sinus bradycardia 07/11/2013    Medications:  Prescriptions prior to admission  Medication Sig Dispense Refill  . Cholecalciferol (VITAMIN D PO) Take 1 tablet by mouth daily.      . clopidogrel (PLAVIX) 75 MG tablet Take 1 tablet (75 mg total) by mouth daily with breakfast.  30 tablet  0  . GLUCOSAMINE-CHONDROITIN PO Take 1 tablet by mouth daily.      . vitamin B-12 (CYANOCOBALAMIN) 100 MCG tablet Take 50 mcg by mouth daily.        Assessment: 79 yo F to started on heparin bridge to coumadin for afib. She had recent TIA 2 weeks ago with fall 05/2013 -> neg head CT and MRI/MRA. CHA2DS2VASc = 5, Plavix stopped with starting full anticoagulation.    Heparin drip 800 uts/hr HL 0.52 at goal. INR 0.98  this morning, low as expected after only 1 dose of coumadin 2.5mg  last night    Goal of Therapy:  Heparin level 0.3-0.7 units/ml Monitor platelets by anticoagulation protocol: Yes   Plan:  Continue heparin drip to 800 units/hr Coumadin 4 mg x1 Daily CBC, HL, INR  Bayard Hugger, PharmD, BCPS  Clinical Pharmacist  Pager: [phone]   07/12/2013 8:33 AM

## 2013-07-12 NOTE — Progress Notes (Signed)
PT Cancellation Note  Patient Details Name: Erica Carroll MRN: [id number] DOB: March 30, 1922   Cancelled Treatment:    Reason Eval/Treat Not Completed: PT screened, no needs identified, will sign off.  Daughter reports she has been walking the halls with the pt and is declining PT eval at this time reporting that pt is at baseline level of functioning.  PT to sign off at this time.  Please re-order if needed.     Rollene Rotunda Rishith Siddoway, PT, DPT [phone]   07/12/2013, 4:53 PM

## 2013-07-12 NOTE — Plan of Care (Signed)
Problem: Phase II Progression Outcomes Goal: Anticoagulation Therapy per MD order Outcome: Completed/Met Date Met:  07/12/13 Coumadin

## 2013-07-13 ENCOUNTER — Other Ambulatory Visit: Payer: Self-pay | Admitting: Physician Assistant

## 2013-07-13 DIAGNOSIS — I480 Paroxysmal atrial fibrillation: Secondary | ICD-10-CM

## 2013-07-13 LAB — CBC
MCH: 29.9 pg (ref 26.0–34.0)
MCHC: 34.4 g/dL (ref 30.0–36.0)
Platelets: 150 10*3/uL (ref 150–400)
RBC: 4.02 MIL/uL (ref 3.87–5.11)
RDW: 12.9 % (ref 11.5–15.5)

## 2013-07-13 LAB — PROTIME-INR
INR: 1.14 (ref 0.00–1.49)
Prothrombin Time: 14.4 seconds (ref 11.6–15.2)

## 2013-07-13 LAB — HEPARIN LEVEL (UNFRACTIONATED): Heparin Unfractionated: 0.76 IU/mL — ABNORMAL HIGH (ref 0.30–0.70)

## 2013-07-13 MED ORDER — ENOXAPARIN SODIUM 60 MG/0.6ML ~~LOC~~ SOLN
60.0000 mg | SUBCUTANEOUS | Status: DC
  Filled 2013-07-13: qty 0.6

## 2013-07-13 MED ORDER — ENOXAPARIN SODIUM 60 MG/0.6ML ~~LOC~~ SOLN
60.0000 mg | SUBCUTANEOUS | Status: DC
  Administered 2013-07-13: 60 mg via SUBCUTANEOUS
  Filled 2013-07-13 (×2): qty 0.6

## 2013-07-13 MED ORDER — OFF THE BEAT BOOK
Freq: Once | Status: AC
  Administered 2013-07-13: 13:00:00
  Filled 2013-07-13: qty 1

## 2013-07-13 MED ORDER — WARFARIN SODIUM 7.5 MG PO TABS
7.5000 mg | ORAL_TABLET | Freq: Once | ORAL | Status: AC
  Administered 2013-07-13: 7.5 mg via ORAL
  Filled 2013-07-13: qty 1

## 2013-07-13 MED ORDER — ENOXAPARIN SODIUM 60 MG/0.6ML ~~LOC~~ SOLN: 60.0000 mg | SUBCUTANEOUS | Status: DC

## 2013-07-13 NOTE — Progress Notes (Signed)
Patient had 4 beats of SVT on tele, now back Sinus Brady in 50's.  AM dose of pindolol held per PA orders given and bedtime dose rescheduled.  Dr. Lendell Caprice on floor and aware.  Heparin drip stopped and sq lovenox given 1 hour after.  Will continue to monitor.  Colman Cater

## 2013-07-13 NOTE — Progress Notes (Signed)
ANTICOAGULATION CONSULT NOTE - Follow Up Consult  Pharmacy Consult for heparin  Indication: atrial fibrillation  No Known Allergies  Patient Measurements: Height: 5\' 5"  (165.1 cm) Weight: 132 lb 4.4 oz (60 kg) IBW/kg (Calculated) : 57   Vital Signs: Temp: 97.2 F (36.2 C) (10/17 0433) BP: 139/53 mmHg (10/17 0433) Pulse Rate: 50 (10/17 0433)  Labs:  Recent Labs  07/10/13 0714 07/10/13 1020 07/10/13 1533 07/10/13 2250  07/11/13 0512 07/12/13 0500 07/13/13 0443  HGB 11.5*  --   --   --   --  12.1 13.0 12.0  HCT 32.0*  --   --   --   --  34.1* 36.6 34.9*  PLT 156  --   --   --   --  151 161 150  LABPROT  --   --   --   --   --   --  12.8 14.4  INR  --   --   --   --   --   --  0.98 1.14  HEPARINUNFRC  --   --   --   --   < > 0.58 0.52 0.76*  CREATININE 0.84  --   --   --   --  0.87  --   --   TROPONINI  --  <0.30 <0.30 <0.30  --   --   --   --   < > = values in this interval not displayed.  Estimated Creatinine Clearance: 40.2 ml/min (by C-G formula based on Cr of 0.87).   Medical History: Past Medical History  Diagnosis Date  . TIA (transient ischemic attack)     a. with fall 05/2013 -> neg head CT and MRI/MRA;  b. Plavix started.  . A-fib     a. Dx 06/2013;  b. 06/2013 Echo:  EF 55-60%, no reg wma, mild AI, mod dil LA.  . Sinus bradycardia 07/11/2013    Medications:  Prescriptions prior to admission  Medication Sig Dispense Refill  . Cholecalciferol (VITAMIN D PO) Take 1 tablet by mouth daily.      . clopidogrel (PLAVIX) 75 MG tablet Take 1 tablet (75 mg total) by mouth daily with breakfast.  30 tablet  0  . GLUCOSAMINE-CHONDROITIN PO Take 1 tablet by mouth daily.      . vitamin B-12 (CYANOCOBALAMIN) 100 MCG tablet Take 50 mcg by mouth daily.        Assessment: 79 yo F to started on heparin bridge to coumadin for afib. She had recent TIA 2 weeks ago with fall 05/2013 -> neg head CT and MRI/MRA. CHA2DS2VASc = 5, Plavix stopped with starting full  anticoagulation.    Heparin drip 800 uts/hr HL 0.76 slightly above goal.    Goal of Therapy:  Heparin level 0.3-0.7 units/ml Monitor platelets by anticoagulation protocol: Yes   Plan:  Decrease heparin drip to 700 units/hr Recheck HL in 8 hours  Talbert Cage, PharmD Clinical Pharmacist  07/13/2013 6:21 AM

## 2013-07-13 NOTE — Progress Notes (Signed)
Patient Name: Erica Carroll     SUBJECTIVE:  Admitted with afib>>NSR and with TIA 2 weeks ago, started on anticoagulation; denies chest pain or dyspnea.  Afib documented to >>170-90   Also with sinus into 40's and 50s Past Medical History  Diagnosis Date  . TIA (transient ischemic attack)     a. with fall 05/2013 -> neg head CT and MRI/MRA;  b. Plavix started.  . A-fib     a. Dx 06/2013;  b. 06/2013 Echo:  EF 55-60%, no reg wma, mild AI, mod dil LA.  . Sinus bradycardia 07/11/2013    Scheduled Meds:  Scheduled Meds: . pantoprazole  40 mg Oral Q1200  . pindolol  2.5 mg Oral BID  . sodium chloride  3 mL Intravenous Q12H  . sodium chloride  3 mL Intravenous Q12H  . Warfarin - Pharmacist Dosing Inpatient   Does not apply q1800   Continuous Infusions: . 0.9 % NaCl with KCl 20 mEq / L 10 mL/hr at 07/11/13 2213  . heparin 700 Units/hr (07/13/13 0628)    PHYSICAL EXAM Filed Vitals:   07/12/13 1421 07/12/13 1933 07/12/13 2105 07/13/13 0433  BP: 116/42 117/47  139/53  Pulse: 55 52 60 50  Temp: 97.6 F (36.4 C) 97.8 F (36.6 C)  97.2 F (36.2 C)  TempSrc: Oral     Resp: 15 16  18   Height:      Weight:      SpO2: 100% 100%  99%   Well developed and nourished in no acute distress HENT normal Neck supple  Chest Clear Regular rate and rhythm Abd-soft, NT Ext No edema Skin-warm and dry A & Oriented  Grossly normal sensory and motor function  TELEMETRY: Reviewed telemetry pt in sinus 40-50s   Intake/Output Summary (Last 24 hours) at 07/13/13 0704 Last data filed at 07/12/13 1835  Gross per 24 hour  Intake    960 ml  Output      0 ml  Net    960 ml    LABS: Basic Metabolic Panel:  Recent Labs Lab 07/10/13 0714 07/11/13 0512  NA 136 137  K 3.2* 4.4  CL 103 109  CO2 20 19  GLUCOSE 77 88  BUN 11 10  CREATININE 0.84 0.87  CALCIUM 8.4 8.6  MG  --  2.2   Cardiac Enzymes:  Recent Labs  07/10/13 1020 07/10/13 1533 07/10/13 2250  TROPONINI  <0.30 <0.30 <0.30   CBC:  Recent Labs Lab 07/10/13 0714 07/11/13 0512 07/12/13 0500 07/13/13 0443  WBC 6.6 6.4 6.1 6.2  NEUTROABS 4.0  --   --   --   HGB 11.5* 12.1 13.0 12.0  HCT 32.0* 34.1* 36.6 34.9*  MCV 85.3 86.5 87.4 86.8  PLT 156 151 161 150   PROTIME:  Recent Labs  07/12/13 0500 07/13/13 0443  LABPROT 12.8 14.4  INR 0.98 1.14   Liver Function Tests:  Recent Labs  07/10/13 0714 07/11/13 0512  AST 13 14  ALT 8 8  ALKPHOS 55 46  BILITOT 0.3 0.4  PROT 6.2 6.0  ALBUMIN 3.4* 3.2*    Recent Labs  07/10/13 0714  LIPASE 45     Recent Labs  07/11/13 0512  TSH 0.575      ASSESSMENT AND PLAN:  Principal Problem:   Paroxysmal atrial fibrillation Active Problems:   TIA (transient ischemic attack)   GERD (gastroesophageal reflux disease)   Sinus bradycardia  Continue heparin until  INR >2 with 24 hour overlap. Continue pindolol. Watch for bradycardia.  Signed, Olga Millers MD  07/13/2013

## 2013-07-13 NOTE — Progress Notes (Addendum)
ANTICOAGULATION CONSULT NOTE - Follow Up Consult  Pharmacy Consult for heparin / warfarin Indication: atrial fibrillation  No Known Allergies  Patient Measurements: Height: 5\' 5"  (165.1 cm) Weight: 132 lb 4.4 oz (60 kg) IBW/kg (Calculated) : 57   Vital Signs: Temp: 97.2 F (36.2 C) (10/17 0433) BP: 139/53 mmHg (10/17 0433) Pulse Rate: 50 (10/17 0433)  Labs:  Recent Labs  07/10/13 1020 07/10/13 1533 07/10/13 2250  07/11/13 0512 07/12/13 0500 07/13/13 0443  HGB  --   --   --   < > 12.1 13.0 12.0  HCT  --   --   --   --  34.1* 36.6 34.9*  PLT  --   --   --   --  151 161 150  LABPROT  --   --   --   --   --  12.8 14.4  INR  --   --   --   --   --  0.98 1.14  HEPARINUNFRC  --   --   --   < > 0.58 0.52 0.76*  CREATININE  --   --   --   --  0.87  --   --   TROPONINI <0.30 <0.30 <0.30  --   --   --   --   < > = values in this interval not displayed.  Estimated Creatinine Clearance: 40.2 ml/min (by C-G formula based on Cr of 0.87).   Medical History: Past Medical History  Diagnosis Date  . TIA (transient ischemic attack)     a. with fall 05/2013 -> neg head CT and MRI/MRA;  b. Plavix started.  . A-fib     a. Dx 06/2013;  b. 06/2013 Echo:  EF 55-60%, no reg wma, mild AI, mod dil LA.  . Sinus bradycardia 07/11/2013    Medications:  Prescriptions prior to admission  Medication Sig Dispense Refill  . Cholecalciferol (VITAMIN D PO) Take 1 tablet by mouth daily.      . clopidogrel (PLAVIX) 75 MG tablet Take 1 tablet (75 mg total) by mouth daily with breakfast.  30 tablet  0  . GLUCOSAMINE-CHONDROITIN PO Take 1 tablet by mouth daily.      . vitamin B-12 (CYANOCOBALAMIN) 100 MCG tablet Take 50 mcg by mouth daily.        Assessment: 79 yo F to started on heparin bridge to coumadin for afib. She had recent TIA 2 weeks ago with fall 05/2013 -> neg head CT and MRI/MRA. CHA2DS2VASc = 5, Plavix stopped with starting full anticoagulation.    INR 1.14 this morning, trending up  slowly. Heparin level supratherapeutic, rate has been adjusted. CBC stable, No bleeding noted per chart.    Goal of Therapy:  Heparin level 0.3-0.7 units/ml Monitor platelets by anticoagulation protocol: Yes   Plan:  Coumadin 7.5 mg x1 Will f/u up heparin level this afternoon. Daily CBC, HL, INR Coumadin education completed with family  Bayard Hugger, PharmD, BCPS  Clinical Pharmacist  Pager: [phone]   07/13/2013 8:28 AM   Addendum:  Working on possible discharge patient tomorrow, will switch heparin to sq lovenox bridge to therapeutic INR. Scr = 0.87, est. crcl ~ 30 - 40 ml/min.   Plan:  - D/c IV heparin  - Start lovenox 60 mg sq Q 24, given old age and borderline poor renal function, start lovenox 1 hr after d/c heparin infusion - We will continue f/u PT/INR tomorrow AM   Bayard Hugger, PharmD, BCPS  Clinical Pharmacist  Pager: [phone]

## 2013-07-13 NOTE — Plan of Care (Signed)
Problem: Phase III Progression Outcomes Goal: Anticoagulation Therapy per MD order Outcome: Completed/Met Date Met:  07/13/13 lovenox Erica Carroll

## 2013-07-13 NOTE — Progress Notes (Signed)
   [facility] PROGRESS NOTE  Erica SIGUENZA RUE:[id number] DOB: 1922-08-06 DOA: 07/10/2013 PCP: Nadean Corwin, MD  Assessment/Plan:  Principal Problem:   Paroxysmal atrial fibrillation:  Discussed with cardiology.  Transition to lovenox. D/c home on coumadin/lovenox bridge in am. Active Problems:   TIA (transient ischemic attack)   GERD (gastroesophageal reflux disease)  HPI/Subjective: Feels better.  No bleeding  Objective: Filed Vitals:   07/13/13 1543  BP:   Pulse: 57  Temp:   Resp:     Intake/Output Summary (Last 24 hours) at 07/13/13 2021 Last data filed at 07/13/13 1700  Gross per 24 hour  Intake    800 ml  Output      0 ml  Net    800 ml   Filed Weights   07/10/13 0948 07/12/13 0552  Weight: 61.236 kg (135 lb) 60 kg (132 lb 4.4 oz)   Tele:  NSR 62  Exam:   Cardiovascular: RRR without MGR  Respiratory: CTA without WRR  Abdomen: S, nt, nd  Ext:  No CCE  Data Reviewed: Basic Metabolic Panel:  Recent Labs Lab 07/10/13 0714 07/11/13 0512  NA 136 137  K 3.2* 4.4  CL 103 109  CO2 20 19  GLUCOSE 77 88  BUN 11 10  CREATININE 0.84 0.87  CALCIUM 8.4 8.6  MG  --  2.2   Liver Function Tests:  Recent Labs Lab 07/10/13 0714 07/11/13 0512  AST 13 14  ALT 8 8  ALKPHOS 55 46  BILITOT 0.3 0.4  PROT 6.2 6.0  ALBUMIN 3.4* 3.2*    Recent Labs Lab 07/10/13 0714  LIPASE 45   No results found for this basename: AMMONIA,  in the last 168 hours CBC:  Recent Labs Lab 07/10/13 0714 07/11/13 0512 07/12/13 0500 07/13/13 0443  WBC 6.6 6.4 6.1 6.2  NEUTROABS 4.0  --   --   --   HGB 11.5* 12.1 13.0 12.0  HCT 32.0* 34.1* 36.6 34.9*  MCV 85.3 86.5 87.4 86.8  PLT 156 151 161 150   Cardiac Enzymes:  Recent Labs Lab 07/10/13 1020 07/10/13 1533 07/10/13 2250  TROPONINI <0.30 <0.30 <0.30   BNP (last 3 results) No results found for this basename: PROBNP,  in the last 8760 hours CBG: No results found for this basename:  GLUCAP,  in the last 168 hours  No results found for this or any previous visit (from the past 240 hour(s)).   Studies: No results found.  Scheduled Meds: . enoxaparin (LOVENOX) injection  60 mg Subcutaneous Q24H  . pantoprazole  40 mg Oral Q1200  . pindolol  2.5 mg Oral BID  . sodium chloride  3 mL Intravenous Q12H  . sodium chloride  3 mL Intravenous Q12H  . Warfarin - Pharmacist Dosing Inpatient   Does not apply q1800   Continuous Infusions: . 0.9 % NaCl with KCl 20 mEq / L 10 mL/hr at 07/11/13 2213   Time spent: 25 min  Ambrielle Kington L  [facility] Pager [phone]. If 7PM-7AM, please contact night-coverage at [email or web address], password [facility] 07/13/2013, 8:21 PM  LOS: 3 days

## 2013-07-14 LAB — CBC
Hemoglobin: 12.5 g/dL (ref 12.0–15.0)
MCH: 30.3 pg (ref 26.0–34.0)
MCHC: 35.1 g/dL (ref 30.0–36.0)
MCV: 86.4 fL (ref 78.0–100.0)
Platelets: 154 10*3/uL (ref 150–400)
RBC: 4.12 MIL/uL (ref 3.87–5.11)
WBC: 5 10*3/uL (ref 4.0–10.5)

## 2013-07-14 LAB — PROTIME-INR: INR: 1.23 (ref 0.00–1.49)

## 2013-07-14 MED ORDER — WARFARIN SODIUM 10 MG PO TABS
10.0000 mg | ORAL_TABLET | Freq: Once | ORAL | Status: DC
  Filled 2013-07-14: qty 1

## 2013-07-14 MED ORDER — WARFARIN SODIUM 5 MG PO TABS: ORAL_TABLET | ORAL | Status: DC

## 2013-07-14 MED ORDER — PINDOLOL 5 MG PO TABS: 2.5000 mg | ORAL_TABLET | Freq: Two times a day (BID) | ORAL | Status: DC

## 2013-07-14 NOTE — Discharge Summary (Signed)
Physician Discharge Summary  BONNITA NEWBY MVH:[id number] DOB: 26-Oct-1921 DOA: 07/10/2013  PCP: Nadean Corwin, MD  Admit date: 07/10/2013 Discharge date: 07/14/2013  Time spent: greater than 30 min  Recommendations for Outpatient Follow-up:  1. INR check by home RN Monday. If less than 2.0, continue lovenox until INR therapeutic  Discharge Diagnoses:  Principal Problem:   Paroxysmal atrial fibrillation Active Problems:   TIA (transient ischemic attack)   GERD (gastroesophageal reflux disease)   Sinus bradycardia  Discharge Condition: stable  Filed Weights   07/10/13 0948 07/12/13 0552  Weight: 61.236 kg (135 lb) 60 kg (132 lb 4.4 oz)    History of present illness:  79 year old female who was recently discharged from [facility] after she was treated for TIA, patient was sent home on Plavix. There was also question of A. fib which was not confirmed. She also had an echocardiogram which showed EF of 55-60%. As per patient over the past one week she has had lot of reflux, and has felt dizzy. This morning also patient felt very dizzy which she attributed to her vestibular problems, she also started to have fluttering in the left side of chest under the breast along with discomfort. She did not have nausea or vomiting. She did not pass out. There was no radiation of the pain. Patient called EMS, she was found to be in A. fib with RVR with heart rate as high as 190s but mainly fluctuating between 120-170 range per EMS. Patient also was noted to be pale and diaphoretic appearing ,en route patient converted back to normal sinus rhythm. In the ED patient was found to be mildly hypotensive with systolic blood pressure around low 100's. Patient also was found to be mildly anemic with hemoglobin dropping to 11.5. The last hemoglobin and on 06/27/2013 was 13.2. Patient denies chest pain at this time, no shortness of breath. She denies fever no dysuria urgency or frequency of urination.  Patient otherwise is relatively healthy with no significant medical problems. Patient was found to have low potassium in the ED.   Hospital Course:   Patient was to telemetry. Remained in sinus rhythm with periods of bradycardia into the 50s, asymptomatic. Cardiology consulted and recommended heparin drip and Coumadin. She was started initially on diltiazem but subsequently switched to pindolol. She worked with physical therapy. She has been transitioned to Lovenox and may get this set up home for bridging with Coumadin. Home health nursing has been arranged. She will followup in the [facility] and will also followup with cardiology.  Procedures:  none  Consultations:  cardiology  Discharge Exam: Filed Vitals:   07/14/13 0500  BP: 132/56  Pulse: 51  Temp: 97.7 F (36.5 C)  Resp: 16    General: Alert, oriented. Cardiovascular: Regular rate rhythm without murmurs gallops rubs Respiratory: Clear to auscultation bilaterally without wheezes rhonchi or rales  Discharge Instructions  Discharge Orders   Future Appointments Provider Department Dept Phone   07/17/2013 2:30 PM [facility] [place] Office [phone]   07/26/2013 9:50 AM Beatrice Lecher, PA-C [facility] [phone]   Future Orders Complete By Expires   Activity as tolerated - No restrictions  As directed    Discharge instructions  As directed    Comments:     WARFARIN COMPATIBLE DIET       Medication List    STOP taking these medications       clopidogrel  75 MG tablet  Commonly known as:  PLAVIX      TAKE these medications       enoxaparin 60 MG/0.6ML injection  Commonly known as:  LOVENOX  Inject 0.6 mLs (60 mg total) into the skin daily.     GLUCOSAMINE-CHONDROITIN PO  Take 1 tablet by mouth daily.     pindolol 5 MG tablet  Commonly known as:  VISKEN  Take 0.5 tablets (2.5 mg total) by mouth 2 (two) times  daily.     vitamin B-12 100 MCG tablet  Commonly known as:  CYANOCOBALAMIN  Take 50 mcg by mouth daily.     VITAMIN D PO  Take 1 tablet by mouth daily.     warfarin 5 MG tablet  Commonly known as:  COUMADIN  TAKE 1.5 TABLETS BY MOUTH NIGHTLY OR AS INSTRUCTED       No Known Allergies     Follow-up Information   Follow up with [facility]. (Coumadin Clinic appointment Tuesday 07/17/13 at 2:30pm)    Specialty:  Cardiology   Contact information:   [address] [place] [address] [phone]      Follow up with Tereso Newcomer, PA-C. [facility] - 07/26/13 at 9:50am)    Specialty:  Physician Assistant   Contact information:   [address] [place] [address] [phone]       Follow up with [facility]. (Dr. Ludwig Clarks office will call you with a time to pick up your heart monitor in 2 weeks.)    Specialty:  Cardiology   Contact information:   [address] [place] [address] [phone]       The results of significant diagnostics from this hospitalization (including imaging, microbiology, ancillary and laboratory) are listed below for reference.    Significant Diagnostic Studies: Ct Head Wo Contrast  06/25/2013   *RADIOLOGY REPORT*  Clinical Data: Larey Seat Saturday afternoon, sudden onset of confusion this afternoon  CT HEAD WITHOUT CONTRAST  Technique:  Contiguous axial images were obtained from the base of the skull through the vertex without contrast.  Comparison: None.  Findings: The calvarium is intact.  There is no abnormal attenuation to suggest hemorrhage, infarct, mass, or extra-axial fluid.  There is mild age-related mineralization of the basal ganglia.  There is age related mild to moderate atrophy.  IMPRESSION: No acute findings   Original Report Authenticated By: Esperanza Heir, M.D.   Mri Brain Without Contrast  06/26/2013   CLINICAL  DATA:  79 year old female with transient aphasia that lasted 20 min. Atrial fibrillation. Initial encounter.  EXAM: MRI HEAD WITHOUT CONTRAST  MRA HEAD WITHOUT CONTRAST  TECHNIQUE: Multiplanar, multiecho pulse sequences of the brain and surrounding structures were obtained without intravenous contrast. Angiographic images of the head were obtained using MRA technique without contrast.  COMPARISON:  Head CT without contrast 06/25/2013.  FINDINGS: MRI HEAD FINDINGS  Cerebral volume is within normal limits for age. No restricted diffusion to suggest acute infarction. No midline shift, mass effect, evidence of mass lesion, ventriculomegaly, extra-axial collection or acute intracranial hemorrhage. Cervicomedullary junction and pituitary are within normal limits. Negative visualized cervical spine. Major intracranial vascular flow voids are preserved. Wallace Cullens and white matter signal is within normal limits throughout the brain.  Postoperative changes to the globes. Right maxillary sinus mucous retention cyst. Other Visualized paranasal sinuses and mastoids are clear. Normal visualized internal auditory structures. Visualized scalp soft  tissues are within normal limits. Normal bone marrow signal.  MRA HEAD FINDINGS  Antegrade flow in the posterior circulation with codominant distal vertebral arteries. Normal PICA origins. Normal vertebrobasilar junction. No basilar stenosis. SCA and PCA origins within normal limits. Posterior communicating arteries are diminutive or absent. Bilateral PCA branches are within normal limits.  Antegrade flow in both ICA siphons. Ophthalmic artery origins within normal limits. No ICA stenosis. Normal carotid termini, MCA and ACA origins. Anterior communicating artery diminutive or absent. Visualized bilateral ACA and MCA branches are within normal limits.  IMPRESSION: MRI HEAD IMPRESSION  Normal MRI appearance of the brain.  MRA HEAD IMPRESSION  Negative intracranial MRA.   Electronically Signed    By: Augusto Gamble M.D.   On: 06/26/2013 17:52   Dg Chest Port 1 View  07/10/2013   *RADIOLOGY REPORT*  Clinical Data: Cardiac dysrhythmia, palpitations.  PORTABLE CHEST - 1 VIEW  Comparison: None available at time of study interpretation.  Findings: Cardiac silhouette appears upper limits of normal, mediastinal silhouette is not suspicious. Mild chronic interstitial changes without pleural effusions or focal consolidations.  No pneumothorax.  Pulmonary vasculature unremarkable.  Multiple EKG lines overlay the patient and could obscure underlying subtle pathology. Nondisplaced left posterior sixth rib fracture may be acute.  IMPRESSION: Borderline cardiomegaly with very mild chronic interstitial changes, no superimposed acute pulmonary process.  Nondisplaced left posterior sixth rib fracture may be acute.   Original Report Authenticated By: Awilda Metro   Mr Mra Head/brain Wo Cm  06/26/2013   CLINICAL DATA:  79 year old female with transient aphasia that lasted 20 min. Atrial fibrillation. Initial encounter.  EXAM: MRI HEAD WITHOUT CONTRAST  MRA HEAD WITHOUT CONTRAST  TECHNIQUE: Multiplanar, multiecho pulse sequences of the brain and surrounding structures were obtained without intravenous contrast. Angiographic images of the head were obtained using MRA technique without contrast.  COMPARISON:  Head CT without contrast 06/25/2013.  FINDINGS: MRI HEAD FINDINGS  Cerebral volume is within normal limits for age. No restricted diffusion to suggest acute infarction. No midline shift, mass effect, evidence of mass lesion, ventriculomegaly, extra-axial collection or acute intracranial hemorrhage. Cervicomedullary junction and pituitary are within normal limits. Negative visualized cervical spine. Major intracranial vascular flow voids are preserved. Wallace Cullens and white matter signal is within normal limits throughout the brain.  Postoperative changes to the globes. Right maxillary sinus mucous retention cyst. Other  Visualized paranasal sinuses and mastoids are clear. Normal visualized internal auditory structures. Visualized scalp soft tissues are within normal limits. Normal bone marrow signal.  MRA HEAD FINDINGS  Antegrade flow in the posterior circulation with codominant distal vertebral arteries. Normal PICA origins. Normal vertebrobasilar junction. No basilar stenosis. SCA and PCA origins within normal limits. Posterior communicating arteries are diminutive or absent. Bilateral PCA branches are within normal limits.  Antegrade flow in both ICA siphons. Ophthalmic artery origins within normal limits. No ICA stenosis. Normal carotid termini, MCA and ACA origins. Anterior communicating artery diminutive or absent. Visualized bilateral ACA and MCA branches are within normal limits.  IMPRESSION: MRI HEAD IMPRESSION  Normal MRI appearance of the brain.  MRA HEAD IMPRESSION  Negative intracranial MRA.   Electronically Signed   By: Augusto Gamble M.D.   On: 06/26/2013 17:52   EKG Sinus rhythm RBBB and LAFB  Microbiology: No results found for this or any previous visit (from the past 240 hour(s)).   Labs: Basic Metabolic Panel:  Recent Labs Lab 07/10/13 0714 07/11/13 0512  NA 136 137  K 3.2* 4.4  CL 103 109  CO2 20 19  GLUCOSE 77 88  BUN 11 10  CREATININE 0.84 0.87  CALCIUM 8.4 8.6  MG  --  2.2   Liver Function Tests:  Recent Labs Lab 07/10/13 0714 07/11/13 0512  AST 13 14  ALT 8 8  ALKPHOS 55 46  BILITOT 0.3 0.4  PROT 6.2 6.0  ALBUMIN 3.4* 3.2*    Recent Labs Lab 07/10/13 0714  LIPASE 45   No results found for this basename: AMMONIA,  in the last 168 hours CBC:  Recent Labs Lab 07/10/13 0714 07/11/13 0512 07/12/13 0500 07/13/13 0443 07/14/13 0640  WBC 6.6 6.4 6.1 6.2 5.0  NEUTROABS 4.0  --   --   --   --   HGB 11.5* 12.1 13.0 12.0 12.5  HCT 32.0* 34.1* 36.6 34.9* 35.6*  MCV 85.3 86.5 87.4 86.8 86.4  PLT 156 151 161 150 154   Cardiac Enzymes:  Recent Labs Lab  07/10/13 1020 07/10/13 1533 07/10/13 2250  TROPONINI <0.30 <0.30 <0.30   BNP: BNP (last 3 results) No results found for this basename: PROBNP,  in the last 8760 hours CBG: No results found for this basename: GLUCAP,  in the last 168 hours  Signed:  Johnny Gorter L  [facility] 07/14/2013, 8:58 AM

## 2013-07-14 NOTE — Progress Notes (Addendum)
ANTICOAGULATION CONSULT NOTE - Follow Up Consult  Pharmacy Consult for lovenox/ warfarin Indication: atrial fibrillation  No Known Allergies  Patient Measurements: Height: 5\' 5"  (165.1 cm) Weight: 132 lb 4.4 oz (60 kg) IBW/kg (Calculated) : 57   Vital Signs: Temp: 97.7 F (36.5 C) (10/18 0500) BP: 132/56 mmHg (10/18 0500) Pulse Rate: 51 (10/18 0500)  Labs:  Recent Labs  07/12/13 0500 07/13/13 0443 07/14/13 0640  HGB 13.0 12.0 12.5  HCT 36.6 34.9* 35.6*  PLT 161 150 154  LABPROT 12.8 14.4 15.2  INR 0.98 1.14 1.23  HEPARINUNFRC 0.52 0.76*  --     Estimated Creatinine Clearance: 40.2 ml/min (by C-G formula based on Cr of 0.87).   Medical History: Past Medical History  Diagnosis Date  . TIA (transient ischemic attack)     a. with fall 05/2013 -> neg head CT and MRI/MRA;  b. Plavix started.  . A-fib     a. Dx 06/2013;  b. 06/2013 Echo:  EF 55-60%, no reg wma, mild AI, mod dil LA.  . Sinus bradycardia 07/11/2013    Medications:  Prescriptions prior to admission  Medication Sig Dispense Refill  . Cholecalciferol (VITAMIN D PO) Take 1 tablet by mouth daily.      Marland Kitchen GLUCOSAMINE-CHONDROITIN PO Take 1 tablet by mouth daily.      . vitamin B-12 (CYANOCOBALAMIN) 100 MCG tablet Take 50 mcg by mouth daily.      . [DISCONTINUED] clopidogrel (PLAVIX) 75 MG tablet Take 1 tablet (75 mg total) by mouth daily with breakfast.  30 tablet  0    Assessment: 79 yo F to started on heparin bridge to coumadin for afib. She had recent TIA 2 weeks ago with fall 05/2013 -> neg head CT and MRI/MRA. CHA2DS2VASc = 5, Plavix stopped with starting full anticoagulation.   INR 1.23, trending up slowly. Will load patient prior to discharge. Started lovenox 60mg  sq q 24h. CBC stable, No bleeding noted per chart.    Goal of Therapy:  Monitor platelets by anticoagulation protocol: Yes   Plan:  - Recommend Warfarin 10mg  PO x1 today prior to discharge. Recommend starting patient on 5mg  PO daily with  early INR f/u.  - Continue lovenox 60 mg sq Q 24 until INR is therapeutic - Monitor for s/sx of bleed  Yuya Vanwingerden M. Allena Katz, PharmD Clinical Pharmacist- Resident Pager: [phone] Pharmacy: [phone] 07/14/2013 9:59 AM  Per MD will be sending patient home on 7.5 mg daily. D/c'd 10mg  dose today and counseled patient to start warfarin tonight, daughter is a Engineer, civil (consulting) and will pick up script today.  Thank You Seabron Spates D

## 2013-07-16 ENCOUNTER — Ambulatory Visit (INDEPENDENT_AMBULATORY_CARE_PROVIDER_SITE_OTHER): Payer: Medicare Other | Admitting: Pharmacist

## 2013-07-16 DIAGNOSIS — I480 Paroxysmal atrial fibrillation: Secondary | ICD-10-CM

## 2013-07-16 DIAGNOSIS — Z7901 Long term (current) use of anticoagulants: Secondary | ICD-10-CM | POA: Insufficient documentation

## 2013-07-16 DIAGNOSIS — G459 Transient cerebral ischemic attack, unspecified: Secondary | ICD-10-CM

## 2013-07-16 DIAGNOSIS — I4891 Unspecified atrial fibrillation: Secondary | ICD-10-CM

## 2013-07-16 MED ORDER — ENOXAPARIN SODIUM 60 MG/0.6ML ~~LOC~~ SOLN: 60.0000 mg | SUBCUTANEOUS | Status: DC

## 2013-07-19 ENCOUNTER — Ambulatory Visit (INDEPENDENT_AMBULATORY_CARE_PROVIDER_SITE_OTHER): Payer: Medicare Other | Admitting: Cardiology

## 2013-07-19 DIAGNOSIS — Z7901 Long term (current) use of anticoagulants: Secondary | ICD-10-CM

## 2013-07-19 DIAGNOSIS — G459 Transient cerebral ischemic attack, unspecified: Secondary | ICD-10-CM

## 2013-07-19 DIAGNOSIS — I480 Paroxysmal atrial fibrillation: Secondary | ICD-10-CM

## 2013-07-19 DIAGNOSIS — I4891 Unspecified atrial fibrillation: Secondary | ICD-10-CM

## 2013-07-19 LAB — POCT INR: INR: 2.9

## 2013-07-20 ENCOUNTER — Encounter (INDEPENDENT_AMBULATORY_CARE_PROVIDER_SITE_OTHER): Payer: Medicare Other

## 2013-07-20 ENCOUNTER — Encounter: Payer: Self-pay | Admitting: Radiology

## 2013-07-20 DIAGNOSIS — I4891 Unspecified atrial fibrillation: Secondary | ICD-10-CM

## 2013-07-20 DIAGNOSIS — I480 Paroxysmal atrial fibrillation: Secondary | ICD-10-CM

## 2013-07-20 NOTE — Progress Notes (Signed)
Patient ID: Erica Carroll, female   DOB: 12-19-1921, 79 y.o.   MRN: [id number] E cardio 48hr holter monitor applied

## 2013-07-24 ENCOUNTER — Ambulatory Visit (INDEPENDENT_AMBULATORY_CARE_PROVIDER_SITE_OTHER): Payer: Medicare Other | Admitting: Cardiology

## 2013-07-24 DIAGNOSIS — I480 Paroxysmal atrial fibrillation: Secondary | ICD-10-CM

## 2013-07-24 DIAGNOSIS — G459 Transient cerebral ischemic attack, unspecified: Secondary | ICD-10-CM

## 2013-07-24 DIAGNOSIS — I4891 Unspecified atrial fibrillation: Secondary | ICD-10-CM

## 2013-07-24 DIAGNOSIS — Z7901 Long term (current) use of anticoagulants: Secondary | ICD-10-CM

## 2013-07-24 LAB — POCT INR: INR: 2.4

## 2013-07-26 ENCOUNTER — Encounter: Payer: Self-pay | Admitting: Physician Assistant

## 2013-07-26 ENCOUNTER — Ambulatory Visit (INDEPENDENT_AMBULATORY_CARE_PROVIDER_SITE_OTHER): Payer: Medicare Other | Admitting: Physician Assistant

## 2013-07-26 VITALS — BP 130/58 | HR 53 | Ht 65.0 in | Wt 134.0 lb

## 2013-07-26 DIAGNOSIS — I4891 Unspecified atrial fibrillation: Secondary | ICD-10-CM

## 2013-07-26 DIAGNOSIS — Z0000 Encounter for general adult medical examination without abnormal findings: Secondary | ICD-10-CM

## 2013-07-26 DIAGNOSIS — Z23 Encounter for immunization: Secondary | ICD-10-CM

## 2013-07-26 DIAGNOSIS — I480 Paroxysmal atrial fibrillation: Secondary | ICD-10-CM

## 2013-07-26 NOTE — Patient Instructions (Signed)
Your physician has recommended that you wear a holter monitor. WAS PUT ON BY DG TODAY FOR ATRIAL FIBRILLATION. Holter monitors are medical devices that record the heart's electrical activity. Doctors most often use these monitors to diagnose arrhythmias. Arrhythmias are problems with the speed or rhythm of the heartbeat. The monitor is a small, portable device. You can wear one while you do your normal daily activities. This is usually used to diagnose what is causing palpitations/syncope (passing out).   Your physician recommends that you KEEP YOUR follow-up appointment WITH DR. CRENSHAW ON 12/9 @ 1:45  Your physician recommends that you continue on your current medications as directed. Please refer to the Current Medication list given to you today.  PATIENT RECEIVED FLU SHOT TODAY

## 2013-07-26 NOTE — Progress Notes (Signed)
[address] [place], [place]  [address] Phone: [phone] Fax:  [phone]  Date:  07/26/2013   ID:  Erica Carroll, DOB May 10, 1922, MRN [id number]  PCP:  Nadean Corwin, MD  Cardiologist:  Dr. Olga Millers     History of Present Illness: Erica Carroll is a 79 y.o. female with a recent admission for TIA who was admitted 10/14-10/18 with paroxysmal AFib.  She converted to NSR in the ambulance on her was to the ED.  She was bradycardic on diltiazem and switched to Pindolol.  CHADS2-VASc=5.  Echo (06/27/13): EF 55-60%, mild AI, mild to moderate LAE.  Carotid US (9/14): 1-39% bilateral ICA.  She was placed on coumadin for anticoagulation.  She is doing well since discharge. She continues to have pain around her rib fracture. She has felt weak but this is improving. She denies significant dyspnea. She denies chest pain. She denies syncope. She denies orthopnea, PND or edema.  Recent Labs: Potassium  Date Value Range Status  07/11/2013 4.4  3.5 - 5.1 mEq/L Final     Creatinine, Ser  Date Value Range Status  07/11/2013 0.87  0.50 - 1.10 mg/dL Final  69/62/9528 4.13  0.50 - 1.10 mg/dL Final     ALT  Date Value Range Status  07/11/2013 8  0 - 35 U/L Final     TSH  Date Value Range Status  07/11/2013 0.575  0.350 - 4.500 uIU/mL Final     Performed at [facility]     Hemoglobin  Date Value Range Status  07/14/2013 12.5  12.0 - 15.0 g/dL Final     Wt Readings from Last 3 Encounters:  07/26/13 134 lb (60.782 kg)  07/12/13 132 lb 4.4 oz (60 kg)  06/25/13 136 lb 6.4 oz (61.871 kg)     Past Medical History  Diagnosis Date  . TIA (transient ischemic attack)     a. with fall 05/2013 -> neg head CT and MRI/MRA;  b. Plavix started.  . A-fib     a. Dx 06/2013;  b. 06/2013 Echo:  EF 55-60%, no reg wma, mild AI, mod dil LA.  . Sinus bradycardia 07/11/2013    Current Outpatient Prescriptions  Medication Sig Dispense Refill  . Cholecalciferol  (VITAMIN D PO) Take 1 tablet by mouth daily.      Marland Kitchen GLUCOSAMINE-CHONDROITIN PO Take 1 tablet by mouth daily.      . pindolol (VISKEN) 5 MG tablet Take 0.5 tablets (2.5 mg total) by mouth 2 (two) times daily.  30 tablet  0  . vitamin B-12 (CYANOCOBALAMIN) 100 MCG tablet Take 50 mcg by mouth daily.      Marland Kitchen warfarin (COUMADIN) 5 MG tablet TAKE 1.5 TABLETS BY MOUTH NIGHTLY OR AS INSTRUCTED  30 tablet  0   No current facility-administered medications for this visit.    Allergies:   No Known Allergies   Social History:  The patient  reports that she has never smoked. She has never used smokeless tobacco. She reports that she does not drink alcohol or use illicit drugs.   ROS:  Please see the history of present illness.   She denies bleeding problems.   All other systems reviewed and negative.   PHYSICAL EXAM: VS:  BP 130/58  Pulse 53  Ht 5\' 5"  (1.651 m)  Wt 134 lb (60.782 kg)  BMI 22.3 kg/m2 Well nourished, well developed, in no acute distress HEENT: normal Neck: no JVD Cardiac:  normal S1, S2;  RRR; no murmur Lungs:  clear to auscultation bilaterally, no wheezing, rhonchi or rales Abd: soft, nontender, no hepatomegaly Ext: no edema Skin: warm and dry Neuro:  CNs 2-12 intact, no focal abnormalities noted  EKG:  Sinus bradycardia, HR of 53, LAD, T wave inversions in V3-V5     ASSESSMENT AND PLAN:  1. Atrial Fibrillation: Maintaining NSR. She seems to be tolerating the bradycardia. Her monitor was completed recently. This recorded for just 6 hours. I will arrange a repeat 24-hour Holter. Continue Coumadin. This is managed in our clinic via home health.  Continue pindolol for now. 2. Disposition: Flu shot today.  Follow up with Dr. Jens Som in 6-8 weeks.  Signed, Tereso Newcomer, PA-C  07/26/2013 10:04 AM

## 2013-07-28 ENCOUNTER — Telehealth: Payer: Self-pay | Admitting: Physician Assistant

## 2013-07-28 NOTE — Telephone Encounter (Signed)
Pt's daughter called concerned about patient's symptoms. Pt was recently diagnosed with atrial fibrillation - bradycardic at times but tolerating that per last OV. Her daughter says she's feeling dizzy with chest tightness. Pulse is 131, BP 130/90. No reported LOC. Given hx of bradycardia, age, and manifested chest tightness I am hesitant simply to go up on her meds this AM without her being evaluated in person. Advised they proceed to ER for further eval. Daughter verbalized understanding and gratitude. Tony Granquist PA-C

## 2013-07-30 ENCOUNTER — Telehealth: Payer: Self-pay | Admitting: Cardiology

## 2013-07-30 NOTE — Telephone Encounter (Signed)
Take additional 2.5 mg pindolol PRN Olga Millers

## 2013-07-30 NOTE — Telephone Encounter (Signed)
Please review ov 07/26/13 and call 07/28/13. Daughter would like to have guidelines for when these afib episodes occur. Do they need to go to ED / 911 each time? Any meds she could try during the episode? Reviewed with daughter to try hard cough to break rhythm / try to bare down like having a BM.  She was told dr/nurse are out of office today and will return tomorrow. Please call and advise.

## 2013-07-30 NOTE — Telephone Encounter (Signed)
New message  Patients daughter whom is caregiver would like a call back regarding questions about A-fib. Please call patient

## 2013-07-31 ENCOUNTER — Ambulatory Visit (INDEPENDENT_AMBULATORY_CARE_PROVIDER_SITE_OTHER): Payer: Medicare Other | Admitting: Cardiology

## 2013-07-31 DIAGNOSIS — I4891 Unspecified atrial fibrillation: Secondary | ICD-10-CM

## 2013-07-31 DIAGNOSIS — G459 Transient cerebral ischemic attack, unspecified: Secondary | ICD-10-CM

## 2013-07-31 DIAGNOSIS — I480 Paroxysmal atrial fibrillation: Secondary | ICD-10-CM

## 2013-07-31 DIAGNOSIS — Z7901 Long term (current) use of anticoagulants: Secondary | ICD-10-CM

## 2013-07-31 NOTE — Telephone Encounter (Signed)
Spoke with pt dtr, Aware of dr Ludwig Clarks recommendations. Also monitor reviewed by dr Jens Som shows sinus with PAC's/PVC's/ brief PAT.

## 2013-08-02 ENCOUNTER — Encounter: Payer: Self-pay | Admitting: Cardiology

## 2013-08-07 ENCOUNTER — Ambulatory Visit (INDEPENDENT_AMBULATORY_CARE_PROVIDER_SITE_OTHER): Payer: Medicare Other | Admitting: Pharmacist

## 2013-08-07 DIAGNOSIS — Z7901 Long term (current) use of anticoagulants: Secondary | ICD-10-CM

## 2013-08-07 DIAGNOSIS — I4891 Unspecified atrial fibrillation: Secondary | ICD-10-CM

## 2013-08-07 DIAGNOSIS — I480 Paroxysmal atrial fibrillation: Secondary | ICD-10-CM

## 2013-08-07 DIAGNOSIS — G459 Transient cerebral ischemic attack, unspecified: Secondary | ICD-10-CM

## 2013-08-07 LAB — POCT INR: INR: 2.3

## 2013-08-07 MED ORDER — PINDOLOL 5 MG PO TABS: 2.5000 mg | ORAL_TABLET | Freq: Two times a day (BID) | ORAL | Status: DC

## 2013-08-07 MED ORDER — WARFARIN SODIUM 5 MG PO TABS: ORAL_TABLET | ORAL | Status: DC

## 2013-08-09 ENCOUNTER — Telehealth: Payer: Self-pay | Admitting: Cardiology

## 2013-08-09 MED ORDER — WARFARIN SODIUM 5 MG PO TABS: ORAL_TABLET | ORAL | Status: DC

## 2013-08-09 NOTE — Telephone Encounter (Signed)
New message     Refill coumadin-----[facility]  said it was supposed to have been called in---pt did not take dosage last night---pharmacy is on file.

## 2013-08-09 NOTE — Telephone Encounter (Signed)
Spoke with pt and informed her that Rx was sent to pharmacy today. Also, LMOM for [facility] nurse that Rx was done today. Also instructed pt to take 1.5 tablets today since she missed 1/2 tablet yesterday.

## 2013-08-14 ENCOUNTER — Ambulatory Visit (INDEPENDENT_AMBULATORY_CARE_PROVIDER_SITE_OTHER): Payer: Medicare Other | Admitting: Cardiology

## 2013-08-14 ENCOUNTER — Encounter: Payer: Self-pay | Admitting: Internal Medicine

## 2013-08-14 DIAGNOSIS — E559 Vitamin D deficiency, unspecified: Secondary | ICD-10-CM | POA: Insufficient documentation

## 2013-08-14 DIAGNOSIS — E785 Hyperlipidemia, unspecified: Secondary | ICD-10-CM | POA: Insufficient documentation

## 2013-08-14 DIAGNOSIS — Z7901 Long term (current) use of anticoagulants: Secondary | ICD-10-CM

## 2013-08-14 DIAGNOSIS — I4891 Unspecified atrial fibrillation: Secondary | ICD-10-CM

## 2013-08-14 DIAGNOSIS — G459 Transient cerebral ischemic attack, unspecified: Secondary | ICD-10-CM

## 2013-08-14 DIAGNOSIS — I480 Paroxysmal atrial fibrillation: Secondary | ICD-10-CM

## 2013-08-14 LAB — POCT INR: INR: 3

## 2013-08-15 ENCOUNTER — Ambulatory Visit: Payer: Self-pay | Admitting: Internal Medicine

## 2013-08-15 ENCOUNTER — Ambulatory Visit: Payer: Self-pay | Admitting: Physician Assistant

## 2013-08-15 ENCOUNTER — Ambulatory Visit: Payer: Medicare Other | Admitting: Internal Medicine

## 2013-08-15 ENCOUNTER — Encounter: Payer: Self-pay | Admitting: Physician Assistant

## 2013-08-15 VITALS — BP 138/62 | HR 60 | Temp 98.1°F | Resp 16 | Ht 64.0 in | Wt 139.0 lb

## 2013-08-15 DIAGNOSIS — I4891 Unspecified atrial fibrillation: Secondary | ICD-10-CM

## 2013-08-15 NOTE — Progress Notes (Signed)
HPI Patient presents for a one month follow up. Patient has had several admissions for weakness and what ended up being TIA secondary to pAif. Patient is doing much better but she is still not able to drive due to dizziness so she is getting home health visits. She is here for a face to face for home health.   Past Medical History  Diagnosis Date  . TIA (transient ischemic attack)     a. with fall 05/2013 -> neg head CT and MRI/MRA;  b. Plavix started.  . A-fib     a. Dx 06/2013;  b. 06/2013 Echo:  EF 55-60%, no reg wma, mild AI, mod dil LA.  . Sinus bradycardia 07/11/2013  . Hyperlipidemia   . Vitamin D deficiency   . Allergy   . GERD (gastroesophageal reflux disease)   . Urinary incontinence     Allergies:  No Known Allergies  Current Medications:   Current Outpatient Prescriptions on File Prior to Visit  Medication Sig Dispense Refill  . Cholecalciferol (VITAMIN D PO) Take 8,000 Units by mouth daily.       Marland Kitchen GLUCOSAMINE-CHONDROITIN PO Take 1 tablet by mouth daily.      . Multiple Vitamins-Minerals (MULTIVITAMIN WITH MINERALS) tablet Take 1 tablet by mouth daily.      . pindolol (VISKEN) 5 MG tablet Take 0.5 tablets (2.5 mg total) by mouth 2 (two) times daily.  30 tablet  3  . vitamin B-12 (CYANOCOBALAMIN) 100 MCG tablet Take 50 mcg by mouth daily.      Marland Kitchen warfarin (COUMADIN) 5 MG tablet TAKE 1.5 TABLETS BY MOUTH NIGHTLY OR AS INSTRUCTED  45 tablet  3   No current facility-administered medications on file prior to visit.    ROS: all negative expect above.   Physical: Filed Weights   08/15/13 1544  Weight: 139 lb (63.05 kg)   Filed Vitals:   08/15/13 1544  BP: 138/62  Pulse: 60  Temp: 98.1 F (36.7 C)  Resp: 16   General Appearance: Well nourished, in no apparent distress. Eyes: PERRLA, EOMs. Sinuses: No Frontal/maxillary tenderness ENT/Mouth: Ext aud canals clear, normal light reflex with TMs without erythema, bulging. Post pharynx without erythema, swelling,  exudate.  Respiratory: CTAB Cardio: RRR, no murmurs, rubs or gallops. Peripheral pulses brisk and equal bilaterally, without edema. No aortic or femoral bruits. Abdomen: Flat, soft, with bowl sounds. Nontender, no guarding, rebound. Lymphatics: Non tender without lymphadenopathy.  Musculoskeletal: Full ROM all peripheral extremities, 5/5 strength, and normal gait. Skin: Warm, dry without rashes, lesions, ecchymosis.  Neuro: Cranial nerves intact, reflexes equal bilaterally. Normal muscle tone, no cerebellar symptoms. Sensation intact.  Pysch: Awake and oriented X 3, normal affect, Insight and Judgment appropriate.   Assessment and Plan: Afib- patient states that she is getting coumadin checked at home. She does not feel that her balance is off or needs PT, she does however need INR visits at home.   Continue to follow up with Dr. Jens Som  Follow up for CPE in April and follow up as needed before that.

## 2013-08-21 ENCOUNTER — Ambulatory Visit (INDEPENDENT_AMBULATORY_CARE_PROVIDER_SITE_OTHER): Payer: Medicare Other | Admitting: Cardiology

## 2013-08-21 DIAGNOSIS — I4891 Unspecified atrial fibrillation: Secondary | ICD-10-CM

## 2013-08-21 DIAGNOSIS — Z7901 Long term (current) use of anticoagulants: Secondary | ICD-10-CM

## 2013-08-21 DIAGNOSIS — G459 Transient cerebral ischemic attack, unspecified: Secondary | ICD-10-CM

## 2013-08-21 DIAGNOSIS — I480 Paroxysmal atrial fibrillation: Secondary | ICD-10-CM

## 2013-08-21 LAB — POCT INR: INR: 2.3

## 2013-08-31 ENCOUNTER — Ambulatory Visit (INDEPENDENT_AMBULATORY_CARE_PROVIDER_SITE_OTHER): Payer: Medicare Other | Admitting: Pharmacist

## 2013-08-31 DIAGNOSIS — Z7901 Long term (current) use of anticoagulants: Secondary | ICD-10-CM

## 2013-08-31 DIAGNOSIS — G459 Transient cerebral ischemic attack, unspecified: Secondary | ICD-10-CM

## 2013-08-31 DIAGNOSIS — I4891 Unspecified atrial fibrillation: Secondary | ICD-10-CM

## 2013-08-31 DIAGNOSIS — I480 Paroxysmal atrial fibrillation: Secondary | ICD-10-CM

## 2013-08-31 LAB — POCT INR: INR: 2.3

## 2013-09-04 ENCOUNTER — Ambulatory Visit (INDEPENDENT_AMBULATORY_CARE_PROVIDER_SITE_OTHER): Payer: Medicare Other | Admitting: Cardiology

## 2013-09-04 ENCOUNTER — Encounter: Payer: Self-pay | Admitting: Cardiology

## 2013-09-04 VITALS — BP 140/56 | HR 68 | Ht 65.0 in | Wt 139.8 lb

## 2013-09-04 DIAGNOSIS — I4891 Unspecified atrial fibrillation: Secondary | ICD-10-CM

## 2013-09-04 DIAGNOSIS — M549 Dorsalgia, unspecified: Secondary | ICD-10-CM

## 2013-09-04 NOTE — Patient Instructions (Signed)
Your physician wants you to follow-up in: 6 MONTHS WITH DR CRENSHAW You will receive a reminder letter in the mail two months in advance. If you don't receive a letter, please call our office to schedule the follow-up appointment.  

## 2013-09-04 NOTE — Assessment & Plan Note (Signed)
Patient remains in sinus rhythm on exam.continue beta blocker and Coumadin.

## 2013-09-04 NOTE — Progress Notes (Signed)
      HPI: FU atrial fibrillation; admitted 10/14 with paroxysmal AFib and TIA. She converted to NSR in the ambulance on her was to the ED. She was bradycardic on diltiazem and switched to Pindolol. CHADS2-VASc=5. Echo (06/27/13): EF 55-60%, mild AI, mild to moderate LAE. Carotid US (9/14): 1-39% bilateral ICA. She was placed on coumadin for anticoagulation. Holter monitor in October 2014 showed sinus rhythm, PACs, PVCs and brief PAT. Since she was last seen, she has mild dyspnea on exertion but no orthopnea, PND, pedal edema, chest pain or syncope. She has not had recent palpitations. She is having problems with left back pain.   Current Outpatient Prescriptions  Medication Sig Dispense Refill  . Cholecalciferol (VITAMIN D PO) Take 8,000 Units by mouth daily.       Marland Kitchen GLUCOSAMINE-CHONDROITIN PO Take 1 tablet by mouth daily.      . Multiple Vitamins-Minerals (MULTIVITAMIN WITH MINERALS) tablet Take 1 tablet by mouth daily.      . pindolol (VISKEN) 5 MG tablet Take 0.5 tablets (2.5 mg total) by mouth 2 (two) times daily.  30 tablet  3  . vitamin B-12 (CYANOCOBALAMIN) 100 MCG tablet Take 50 mcg by mouth daily.      Marland Kitchen warfarin (COUMADIN) 5 MG tablet TAKE 1.5 TABLETS BY MOUTH NIGHTLY OR AS INSTRUCTED  45 tablet  3   No current facility-administered medications for this visit.     Past Medical History  Diagnosis Date  . TIA (transient ischemic attack)     a. with fall 05/2013 -> neg head CT and MRI/MRA;  b. Plavix started.  . A-fib     a. Dx 06/2013;  b. 06/2013 Echo:  EF 55-60%, no reg wma, mild AI, mod dil LA.  . Sinus bradycardia 07/11/2013  . Hyperlipidemia   . Vitamin D deficiency   . Allergy   . GERD (gastroesophageal reflux disease)   . Urinary incontinence     Past Surgical History  Procedure Laterality Date  . Eye surgery Right 1994    CE/IOL  . Eye surgery Left 1998    CE/IOL  . Appendectomy  1936    History   Social History  . Marital Status: Married    Spouse Name:  N/A    Number of Children: N/A  . Years of Education: N/A   Occupational History  . Not on file.   Social History Main Topics  . Smoking status: Never Smoker   . Smokeless tobacco: Never Used  . Alcohol Use: No  . Drug Use: No  . Sexual Activity: Not on file   Other Topics Concern  . Not on file   Social History Narrative   Lives in [place] with husband.  Uses Cane to get around.    ROS: left back pain but no fevers or chills, productive cough, hemoptysis, dysphasia, odynophagia, melena, hematochezia, dysuria, hematuria, rash, seizure activity, orthopnea, PND, pedal edema, claudication. Remaining systems are negative.  Physical Exam: Well-developed well-nourished in no acute distress.  Skin is warm and dry.  HEENT is normal.  Neck is supple.  Chest is clear to auscultation with normal expansion.  Cardiovascular exam is regular rate and rhythm.  Abdominal exam nontender or distended. No masses palpated. Extremities show no edema. neuro grossly intact

## 2013-09-04 NOTE — Assessment & Plan Note (Signed)
Patient continues to have back pain.he fell in September and there is no of a rib fracture on chest x-ray during recent admission. If her symptoms persist I have asked her to followup with primary care as she may need additional x-rays and therapy.

## 2013-09-12 ENCOUNTER — Ambulatory Visit (INDEPENDENT_AMBULATORY_CARE_PROVIDER_SITE_OTHER): Payer: Medicare Other | Admitting: *Deleted

## 2013-09-12 DIAGNOSIS — I480 Paroxysmal atrial fibrillation: Secondary | ICD-10-CM

## 2013-09-12 DIAGNOSIS — I4891 Unspecified atrial fibrillation: Secondary | ICD-10-CM

## 2013-09-12 DIAGNOSIS — G459 Transient cerebral ischemic attack, unspecified: Secondary | ICD-10-CM

## 2013-09-12 DIAGNOSIS — Z7901 Long term (current) use of anticoagulants: Secondary | ICD-10-CM

## 2013-10-03 ENCOUNTER — Ambulatory Visit (INDEPENDENT_AMBULATORY_CARE_PROVIDER_SITE_OTHER): Payer: Medicare HMO | Admitting: *Deleted

## 2013-10-03 DIAGNOSIS — I480 Paroxysmal atrial fibrillation: Secondary | ICD-10-CM

## 2013-10-03 DIAGNOSIS — Z7901 Long term (current) use of anticoagulants: Secondary | ICD-10-CM

## 2013-10-03 DIAGNOSIS — I4891 Unspecified atrial fibrillation: Secondary | ICD-10-CM

## 2013-10-03 DIAGNOSIS — G459 Transient cerebral ischemic attack, unspecified: Secondary | ICD-10-CM

## 2013-10-03 LAB — POCT INR: INR: 2.1

## 2013-10-31 ENCOUNTER — Ambulatory Visit (INDEPENDENT_AMBULATORY_CARE_PROVIDER_SITE_OTHER): Payer: Medicare HMO | Admitting: *Deleted

## 2013-10-31 DIAGNOSIS — Z7901 Long term (current) use of anticoagulants: Secondary | ICD-10-CM

## 2013-10-31 DIAGNOSIS — I4891 Unspecified atrial fibrillation: Secondary | ICD-10-CM

## 2013-10-31 DIAGNOSIS — G459 Transient cerebral ischemic attack, unspecified: Secondary | ICD-10-CM

## 2013-10-31 DIAGNOSIS — Z79899 Other long term (current) drug therapy: Secondary | ICD-10-CM | POA: Insufficient documentation

## 2013-10-31 DIAGNOSIS — I480 Paroxysmal atrial fibrillation: Secondary | ICD-10-CM

## 2013-10-31 DIAGNOSIS — Z5181 Encounter for therapeutic drug level monitoring: Secondary | ICD-10-CM

## 2013-10-31 LAB — POCT INR: INR: 1.9

## 2013-11-28 ENCOUNTER — Ambulatory Visit (INDEPENDENT_AMBULATORY_CARE_PROVIDER_SITE_OTHER): Payer: Medicare HMO | Admitting: Pharmacist

## 2013-11-28 ENCOUNTER — Other Ambulatory Visit: Payer: Self-pay | Admitting: Cardiology

## 2013-11-28 DIAGNOSIS — I4891 Unspecified atrial fibrillation: Secondary | ICD-10-CM

## 2013-11-28 DIAGNOSIS — I480 Paroxysmal atrial fibrillation: Secondary | ICD-10-CM

## 2013-11-28 DIAGNOSIS — Z7901 Long term (current) use of anticoagulants: Secondary | ICD-10-CM

## 2013-11-28 DIAGNOSIS — Z5181 Encounter for therapeutic drug level monitoring: Secondary | ICD-10-CM

## 2013-11-28 DIAGNOSIS — G459 Transient cerebral ischemic attack, unspecified: Secondary | ICD-10-CM

## 2013-11-28 LAB — POCT INR: INR: 2.2

## 2013-12-26 ENCOUNTER — Ambulatory Visit (INDEPENDENT_AMBULATORY_CARE_PROVIDER_SITE_OTHER): Payer: Medicare HMO | Admitting: *Deleted

## 2013-12-26 DIAGNOSIS — Z5181 Encounter for therapeutic drug level monitoring: Secondary | ICD-10-CM

## 2013-12-26 DIAGNOSIS — Z7901 Long term (current) use of anticoagulants: Secondary | ICD-10-CM

## 2013-12-26 DIAGNOSIS — G459 Transient cerebral ischemic attack, unspecified: Secondary | ICD-10-CM

## 2013-12-26 DIAGNOSIS — I480 Paroxysmal atrial fibrillation: Secondary | ICD-10-CM

## 2013-12-26 DIAGNOSIS — I4891 Unspecified atrial fibrillation: Secondary | ICD-10-CM

## 2013-12-26 LAB — POCT INR: INR: 2.2

## 2013-12-27 ENCOUNTER — Telehealth: Payer: Self-pay | Admitting: *Deleted

## 2013-12-27 NOTE — Telephone Encounter (Signed)
REFILL= MECLIZINE 25 MG  TO [facility] [address]

## 2013-12-28 ENCOUNTER — Other Ambulatory Visit: Payer: Self-pay | Admitting: Emergency Medicine

## 2013-12-28 MED ORDER — MECLIZINE HCL 25 MG PO TABS: 25.0000 mg | ORAL_TABLET | Freq: Three times a day (TID) | ORAL | Status: AC | PRN

## 2014-01-02 ENCOUNTER — Other Ambulatory Visit: Payer: Self-pay | Admitting: Internal Medicine

## 2014-01-02 DIAGNOSIS — Z1231 Encounter for screening mammogram for malignant neoplasm of breast: Secondary | ICD-10-CM

## 2014-01-03 ENCOUNTER — Ambulatory Visit (HOSPITAL_COMMUNITY)
Admission: RE | Admit: 2014-01-03 | Discharge: 2014-01-03 | Disposition: A | Discharge status: Home / Self Care | Payer: Medicare HMO | Source: Ambulatory Visit | Attending: Internal Medicine | Admitting: Internal Medicine

## 2014-01-03 ENCOUNTER — Encounter: Payer: Self-pay | Admitting: Internal Medicine

## 2014-01-03 ENCOUNTER — Ambulatory Visit (INDEPENDENT_AMBULATORY_CARE_PROVIDER_SITE_OTHER): Payer: Medicare HMO | Admitting: Internal Medicine

## 2014-01-03 ENCOUNTER — Other Ambulatory Visit: Payer: Self-pay | Admitting: Internal Medicine

## 2014-01-03 VITALS — BP 168/80 | HR 64 | Temp 98.1°F | Resp 16 | Ht 63.0 in | Wt 145.8 lb

## 2014-01-03 DIAGNOSIS — I10 Essential (primary) hypertension: Secondary | ICD-10-CM

## 2014-01-03 DIAGNOSIS — R0602 Shortness of breath: Secondary | ICD-10-CM | POA: Insufficient documentation

## 2014-01-03 DIAGNOSIS — E785 Hyperlipidemia, unspecified: Secondary | ICD-10-CM

## 2014-01-03 DIAGNOSIS — R079 Chest pain, unspecified: Secondary | ICD-10-CM

## 2014-01-03 DIAGNOSIS — Z789 Other specified health status: Secondary | ICD-10-CM

## 2014-01-03 DIAGNOSIS — R7309 Other abnormal glucose: Secondary | ICD-10-CM | POA: Insufficient documentation

## 2014-01-03 DIAGNOSIS — Z1331 Encounter for screening for depression: Secondary | ICD-10-CM

## 2014-01-03 DIAGNOSIS — Z5181 Encounter for therapeutic drug level monitoring: Secondary | ICD-10-CM

## 2014-01-03 DIAGNOSIS — D492 Neoplasm of unspecified behavior of bone, soft tissue, and skin: Secondary | ICD-10-CM

## 2014-01-03 DIAGNOSIS — Z1231 Encounter for screening mammogram for malignant neoplasm of breast: Secondary | ICD-10-CM | POA: Insufficient documentation

## 2014-01-03 DIAGNOSIS — Z1212 Encounter for screening for malignant neoplasm of rectum: Secondary | ICD-10-CM

## 2014-01-03 DIAGNOSIS — M25519 Pain in unspecified shoulder: Secondary | ICD-10-CM | POA: Insufficient documentation

## 2014-01-03 DIAGNOSIS — R7303 Prediabetes: Secondary | ICD-10-CM

## 2014-01-03 DIAGNOSIS — Z0000 Encounter for general adult medical examination without abnormal findings: Secondary | ICD-10-CM

## 2014-01-03 DIAGNOSIS — E559 Vitamin D deficiency, unspecified: Secondary | ICD-10-CM

## 2014-01-03 LAB — CBC WITH DIFFERENTIAL/PLATELET
BASOS ABS: 0.1 10*3/uL (ref 0.0–0.1)
Basophils Relative: 1 % (ref 0–1)
Eosinophils Absolute: 0.5 10*3/uL (ref 0.0–0.7)
Eosinophils Relative: 7 % — ABNORMAL HIGH (ref 0–5)
HEMATOCRIT: 35.1 % — AB (ref 36.0–46.0)
Hemoglobin: 12.4 g/dL (ref 12.0–15.0)
Lymphocytes Relative: 30 % (ref 12–46)
Lymphs Abs: 2.2 10*3/uL (ref 0.7–4.0)
MCH: 31 pg (ref 26.0–34.0)
MCHC: 35.3 g/dL (ref 30.0–36.0)
MCV: 87.8 fL (ref 78.0–100.0)
MONO ABS: 0.7 10*3/uL (ref 0.1–1.0)
Monocytes Relative: 10 % (ref 3–12)
NEUTROS ABS: 3.8 10*3/uL (ref 1.7–7.7)
Neutrophils Relative %: 52 % (ref 43–77)
Platelets: 196 10*3/uL (ref 150–400)
RBC: 4 MIL/uL (ref 3.87–5.11)
RDW: 13.8 % (ref 11.5–15.5)
WBC: 7.3 10*3/uL (ref 4.0–10.5)

## 2014-01-03 MED ORDER — PINDOLOL 5 MG PO TABS: 5.0000 mg | ORAL_TABLET | Freq: Two times a day (BID) | ORAL | Status: DC

## 2014-01-03 NOTE — Progress Notes (Signed)
Patient ID: Erica Carroll, female   DOB: 09/13/1922, 80 y.o.   MRN: [id number]   Annual Screening Comprehensive Examination  This very nice 80 y.o. MWF presents for complete physical.  Patient has been followed for labile HTN,  Prediabetes, Hyperlipidemia, and Vitamin D Deficiency.    HTN predates many years and was monitored expectantly until she was recently put on pindolol low dose for rate control of pAfib last Sept.  She had a non-focal brief TIA and was started on plavix and then after a 2sd TIA, she was switched to coumadin an dhas done well since. Patient's BP has been controlled at home. Today's BP is 168/80 mmHg and confirmed on recheck. Patient denies any cardiac symptoms as chest pain, palpitations, shortness of breath, dizziness or ankle swelling.   Patient's hyperlipidemia is controlled with diet and medications. Patient denies myalgias or other medication SE's. Last cholesterol last visit was 131, triglycerides 152, HDL 46 and LDL 55  In Apr 2014- all at goal.     Patient has prediabetes with A1c 5.7% in Apr 2012 and 5.4% in Apr 2014. Patient denies reactive hypoglycemic symptoms, visual blurring, diabetic polys, or paresthesias.    Finally, patient has history of Vitamin D Deficiency of 18 in 2010 and  with last vitamin D 80 in Apr 2014.   Medication Sig  . GLUCOSAMINE-CHONDROITIN PO Take 1 tablet by mouth daily.  . meclizine (ANTIVERT) 25 MG tablet Take 1 tablet (25 mg total) by mouth 3 (three) times daily as needed for dizziness   . pindolol (VISKEN) 5 MG tablet TAKE ONE-HALF TABLET BY MOUTH TWICE DAILY  . vitamin B-12  100 MCG tablet Take 50 mcg by mouth daily.   No Known Allergies  Past Medical History  Diagnosis Date  . TIA (transient ischemic attack)     a. with fall 05/2013 -> neg head CT and MRI/MRA;  b. Plavix started.  . A-fib     a. Dx 06/2013;  b. 06/2013 Echo:  EF 55-60%, no reg wma, mild AI, mod dil LA.  . Sinus bradycardia 07/11/2013  . Hyperlipidemia   .  Vitamin D deficiency   . Allergy   . GERD (gastroesophageal reflux disease)   . Urinary incontinence     Past Surgical History  Procedure Laterality Date  . Eye surgery Right 1994    CE/IOL  . Eye surgery Left 1998    CE/IOL  . Appendectomy  1936    Family History  Problem Relation Age of Onset  . Heart disease Mother   . Heart disease Sister   . Heart disease Brother   . Heart disease Brother   . CVA Sister   . Thyroid disease Sister     History  Substance Use Topics  . Smoking status: Never Smoker   . Smokeless tobacco: Never Used  . Alcohol Use: Yes     Comment: ocassional    ROS Constitutional: Denies fever, chills, weight loss/gain, headaches, insomnia, fatigue, night sweats, and change in appetite. Eyes: Denies redness, blurred vision, diplopia, discharge, itchy, watery eyes.  ENT: Denies discharge, congestion, post nasal drip, epistaxis, sore throat, earache, hearing loss, dental pain, Tinnitus, Vertigo, Sinus pain, snoring.  Cardio: Denies chest pain, palpitations, irregular heartbeat, syncope, dyspnea, diaphoresis, orthopnea, PND, claudication, edema Respiratory: denies cough, dyspnea, DOE, pleurisy, hoarseness, laryngitis, wheezing.  Gastrointestinal: Denies dysphagia, heartburn, reflux, water brash, pain, cramps, nausea, vomiting, bloating, diarrhea, constipation, hematemesis, melena, hematochezia, jaundice, hemorrhoids Genitourinary: Denies dysuria, frequency, urgency, nocturia, hesitancy, discharge,  hematuria, flank pain Breast:Breast lumps, nipple discharge, bleeding.  Musculoskeletal: Denies arthralgia, myalgia, stiffness, Jt. Swelling, pain, limp, and strain/sprain. Skin: Denies puritis, rash, hives, warts, acne, eczema, changing in skin lesion Neuro: No weakness, tremor, incoordination, spasms, paresthesia, pain Psychiatric: Denies confusion, memory loss, sensory loss Endocrine: Denies change in weight, skin, hair change, nocturia, and paresthesia,  diabetic polys, visual blurring, hyper / hypo glycemic episodes.  Heme/Lymph: No excessive bleeding, bruising, enlarged lymph nodes.   Physical Exam    BP 168/80  Pulse 64  Temp 98.1 F   Resp 16  Ht 5\' 3"    Wt 145 lb 12.8 oz   BMI 25.83 kg/m2  General Appearance: Well nourished, in no apparent distress. Eyes: PERRLA, EOMs, conjunctiva no swelling or erythema, normal fundi and vessels. Sinuses: No frontal/maxillary tenderness ENT/Mouth: EACs patent / TMs  nl. Nares clear without erythema, swelling, mucoid exudates. Oral hygiene is good. No erythema, swelling, or exudate. Tongue normal, non-obstructing. Tonsils not swollen or erythematous. Hearing normal.  Neck: Supple, thyroid normal. No bruits, nodes or JVD. Respiratory: Respiratory effort normal.  BS equal and clear bilateral without rales, rhonci, wheezing or stridor. Cardio: Heart sounds are normal with regular rate and rhythm and no murmurs, rubs or gallops. Peripheral pulses are normal and equal bilaterally without edema. No aortic or femoral bruits. Chest: symmetric with normal excursions and percussion. Breasts: Symmetric, without lumps, nipple discharge, retractions, or fibrocystic changes.  Abdomen: Flat, soft, with bowl sounds. Nontender, no guarding, rebound, hernias, masses, or organomegaly.  Lymphatics: Non tender without lymphadenopathy.  Genitourinary:  Musculoskeletal: Full ROM all peripheral extremities, joint stability, 5/5 strength, and normal gait. Skin: Warm and dry without rashes, lesions, cyanosis, clubbing or  ecchymosis.  Neuro: Cranial nerves intact, reflexes equal bilaterally. Normal muscle tone, no cerebellar symptoms. Sensation intact.  Pysch: Awake and oriented X 3, normal affect, Insight and Judgment appropriate.   Assessment and Plan  1. Annual Screening Examination 2. Hypertension  3. Hyperlipidemia 4. Pre Diabetes 5. Vitamin D Deficiency 6. pAfibrillation & Hx/o TIA's x 2  Continue prudent  diet as discussed, weight control, BP monitoring, regular exercise, and medications. Discussed med's effects and SE's. Screening labs and tests as requested with regular follow-up as recommended.Recc increasing Pindolol dose from 2.5 to 5 mg bid.

## 2014-01-03 NOTE — Patient Instructions (Signed)

## 2014-01-04 LAB — TSH: TSH: 0.63 u[IU]/mL (ref 0.350–4.500)

## 2014-01-04 LAB — BASIC METABOLIC PANEL WITH GFR
BUN: 12 mg/dL (ref 6–23)
CO2: 27 mEq/L (ref 19–32)
Calcium: 9.1 mg/dL (ref 8.4–10.5)
Chloride: 98 mEq/L (ref 96–112)
Creat: 0.96 mg/dL (ref 0.50–1.10)
GFR, EST AFRICAN AMERICAN: 61 mL/min
GFR, Est Non African American: 53 mL/min — ABNORMAL LOW
Glucose, Bld: 82 mg/dL (ref 70–99)
POTASSIUM: 4.8 meq/L (ref 3.5–5.3)
Sodium: 133 mEq/L — ABNORMAL LOW (ref 135–145)

## 2014-01-04 LAB — URINALYSIS, MICROSCOPIC ONLY
BACTERIA UA: NONE SEEN
CRYSTALS: NONE SEEN
Casts: NONE SEEN
Squamous Epithelial / LPF: NONE SEEN

## 2014-01-04 LAB — LIPID PANEL
Cholesterol: 120 mg/dL (ref 0–200)
HDL: 47 mg/dL (ref >39)
LDL CALC: 46 mg/dL (ref 0–99)
Total CHOL/HDL Ratio: 2.6 Ratio
Triglycerides: 136 mg/dL (ref <150)
VLDL: 27 mg/dL (ref 0–40)

## 2014-01-04 LAB — HEPATIC FUNCTION PANEL
ALK PHOS: 49 U/L (ref 39–117)
ALT: 15 U/L (ref 0–35)
AST: 18 U/L (ref 0–37)
Albumin: 4.2 g/dL (ref 3.5–5.2)
BILIRUBIN INDIRECT: 0.3 mg/dL (ref 0.2–1.2)
Bilirubin, Direct: 0.1 mg/dL (ref 0.0–0.3)
TOTAL PROTEIN: 6.6 g/dL (ref 6.0–8.3)
Total Bilirubin: 0.4 mg/dL (ref 0.2–1.2)

## 2014-01-04 LAB — INSULIN, FASTING: Insulin fasting, serum: 6 u[IU]/mL (ref 3–28)

## 2014-01-04 LAB — MAGNESIUM: Magnesium: 2 mg/dL (ref 1.5–2.5)

## 2014-01-04 LAB — MICROALBUMIN / CREATININE URINE RATIO
CREATININE, URINE: 40.7 mg/dL
MICROALB/CREAT RATIO: 12.3 mg/g (ref 0.0–30.0)
Microalb, Ur: 0.5 mg/dL (ref 0.00–1.89)

## 2014-01-04 LAB — VITAMIN D 25 HYDROXY (VIT D DEFICIENCY, FRACTURES): Vit D, 25-Hydroxy: 64 ng/mL (ref 30–89)

## 2014-01-04 LAB — HEMOGLOBIN A1C
HEMOGLOBIN A1C: 5.7 % — AB (ref <5.7)
MEAN PLASMA GLUCOSE: 117 mg/dL — AB (ref <117)

## 2014-01-14 ENCOUNTER — Other Ambulatory Visit: Payer: Self-pay | Admitting: Cardiology

## 2014-01-14 ENCOUNTER — Other Ambulatory Visit: Payer: Self-pay | Admitting: *Deleted

## 2014-01-18 ENCOUNTER — Ambulatory Visit (HOSPITAL_COMMUNITY): Payer: Medicare HMO

## 2014-01-21 ENCOUNTER — Other Ambulatory Visit (INDEPENDENT_AMBULATORY_CARE_PROVIDER_SITE_OTHER): Payer: Medicare HMO

## 2014-01-21 DIAGNOSIS — Z1212 Encounter for screening for malignant neoplasm of rectum: Secondary | ICD-10-CM

## 2014-01-21 LAB — POC HEMOCCULT BLD/STL (HOME/3-CARD/SCREEN)
Card #2 Fecal Occult Blod, POC: NEGATIVE
Card #3 Fecal Occult Blood, POC: NEGATIVE
FECAL OCCULT BLD: NEGATIVE

## 2014-01-23 ENCOUNTER — Ambulatory Visit (INDEPENDENT_AMBULATORY_CARE_PROVIDER_SITE_OTHER): Payer: Medicare HMO | Admitting: Pharmacist

## 2014-01-23 VITALS — BP 136/72

## 2014-01-23 DIAGNOSIS — Z5181 Encounter for therapeutic drug level monitoring: Secondary | ICD-10-CM

## 2014-01-23 DIAGNOSIS — G459 Transient cerebral ischemic attack, unspecified: Secondary | ICD-10-CM

## 2014-01-23 DIAGNOSIS — Z7901 Long term (current) use of anticoagulants: Secondary | ICD-10-CM

## 2014-01-23 DIAGNOSIS — I4891 Unspecified atrial fibrillation: Secondary | ICD-10-CM

## 2014-01-23 DIAGNOSIS — I480 Paroxysmal atrial fibrillation: Secondary | ICD-10-CM

## 2014-01-23 LAB — POCT INR: INR: 2.1

## 2014-02-20 ENCOUNTER — Ambulatory Visit (INDEPENDENT_AMBULATORY_CARE_PROVIDER_SITE_OTHER): Payer: Medicare HMO | Admitting: Pharmacist

## 2014-02-20 DIAGNOSIS — I480 Paroxysmal atrial fibrillation: Secondary | ICD-10-CM

## 2014-02-20 DIAGNOSIS — I4891 Unspecified atrial fibrillation: Secondary | ICD-10-CM

## 2014-02-20 DIAGNOSIS — G459 Transient cerebral ischemic attack, unspecified: Secondary | ICD-10-CM

## 2014-02-20 DIAGNOSIS — Z7901 Long term (current) use of anticoagulants: Secondary | ICD-10-CM

## 2014-02-20 DIAGNOSIS — Z5181 Encounter for therapeutic drug level monitoring: Secondary | ICD-10-CM

## 2014-02-20 LAB — POCT INR: INR: 2.1

## 2014-04-03 ENCOUNTER — Ambulatory Visit (INDEPENDENT_AMBULATORY_CARE_PROVIDER_SITE_OTHER): Payer: Medicare HMO | Admitting: *Deleted

## 2014-04-03 DIAGNOSIS — Z7901 Long term (current) use of anticoagulants: Secondary | ICD-10-CM

## 2014-04-03 DIAGNOSIS — G459 Transient cerebral ischemic attack, unspecified: Secondary | ICD-10-CM

## 2014-04-03 DIAGNOSIS — Z5181 Encounter for therapeutic drug level monitoring: Secondary | ICD-10-CM

## 2014-04-03 DIAGNOSIS — I4891 Unspecified atrial fibrillation: Secondary | ICD-10-CM

## 2014-04-03 DIAGNOSIS — I480 Paroxysmal atrial fibrillation: Secondary | ICD-10-CM

## 2014-04-03 LAB — POCT INR: INR: 2.5

## 2014-04-08 ENCOUNTER — Ambulatory Visit: Payer: Self-pay | Admitting: Physician Assistant

## 2014-04-11 ENCOUNTER — Ambulatory Visit (INDEPENDENT_AMBULATORY_CARE_PROVIDER_SITE_OTHER): Payer: Medicare HMO | Admitting: Cardiology

## 2014-04-11 ENCOUNTER — Encounter: Payer: Self-pay | Admitting: Cardiology

## 2014-04-11 VITALS — BP 136/60 | HR 54 | Ht 65.0 in | Wt 143.8 lb

## 2014-04-11 DIAGNOSIS — I10 Essential (primary) hypertension: Secondary | ICD-10-CM

## 2014-04-11 DIAGNOSIS — I480 Paroxysmal atrial fibrillation: Secondary | ICD-10-CM

## 2014-04-11 DIAGNOSIS — I4891 Unspecified atrial fibrillation: Secondary | ICD-10-CM

## 2014-04-11 NOTE — Progress Notes (Signed)
HPI: FU atrial fibrillation; admitted 10/14 with paroxysmal AFib and TIA. Converted to NSR in the ambulance on her way to the ED. She was bradycardic on diltiazem and switched to Pindolol. CHADS2-VASc=5. Echo (06/27/13): EF 55-60%, mild AI, mild to moderate LAE. Carotid US (9/14): 1-39% bilateral ICA. She was placed on coumadin for anticoagulation. Holter monitor in October 2014 showed sinus rhythm, PACs, PVCs and brief PAT. Since she was last seen, she has mild dyspnea on exertion but no orthopnea, PND, pedal edema, chest pain or syncope. She had an episode of atrial fibrillation approximately 2 weeks ago after choking on a pill. It lasted several hours and broke spontaneously.  Current Outpatient Prescriptions  Medication Sig Dispense Refill  . acetaminophen (TYLENOL) 325 MG tablet Take 325 mg by mouth every 6 (six) hours as needed.      . Cholecalciferol (VITAMIN D PO) Take 2,000 Units by mouth. Takes 6000 units daily      . GLUCOSAMINE-CHONDROITIN PO Take 1 tablet by mouth daily.      . meclizine (ANTIVERT) 25 MG tablet Take 1 tablet (25 mg total) by mouth 3 (three) times daily as needed for dizziness or nausea.  60 tablet  1  . OVER THE COUNTER MEDICATION Preserve vision vitamin      . pindolol (VISKEN) 5 MG tablet Take 2.5 mg by mouth 2 (two) times daily.      . vitamin B-12 (CYANOCOBALAMIN) 100 MCG tablet Take 50 mcg by mouth daily.      Marland Kitchen warfarin (COUMADIN) 5 MG tablet Take 1 tablet (5 mg total) by mouth as directed. Take As Directed by [facility]  40 tablet  3   No current facility-administered medications for this visit.     Past Medical History  Diagnosis Date  . TIA (transient ischemic attack)     a. with fall 05/2013 -> neg head CT and MRI/MRA;  b. Plavix started.  . A-fib     a. Dx 06/2013;  b. 06/2013 Echo:  EF 55-60%, no reg wma, mild AI, mod dil LA.  . Sinus bradycardia 07/11/2013  . Hyperlipidemia   . Vitamin D deficiency   . Allergy   . GERD  (gastroesophageal reflux disease)   . Urinary incontinence     Past Surgical History  Procedure Laterality Date  . Eye surgery Right 1994    CE/IOL  . Eye surgery Left 1998    CE/IOL  . Appendectomy  1936    History   Social History  . Marital Status: Married    Spouse Name: N/A    Number of Children: N/A  . Years of Education: N/A   Occupational History  . Not on file.   Social History Main Topics  . Smoking status: Never Smoker   . Smokeless tobacco: Never Used  . Alcohol Use: Yes     Comment: ocassional  . Drug Use: No  . Sexual Activity: Not on file   Other Topics Concern  . Not on file   Social History Narrative   Lives in [place] with husband.  Uses Cane to get around.    ROS: no fevers or chills, productive cough, hemoptysis, dysphasia, odynophagia, melena, hematochezia, dysuria, hematuria, rash, seizure activity, orthopnea, PND, pedal edema, claudication. Remaining systems are negative.  Physical Exam: Well-developed well-nourished in no acute distress.  Skin is warm and dry.  HEENT is normal.  Neck is supple.  Chest is clear to auscultation with normal expansion.  Cardiovascular exam  is regular rate and rhythm.  Abdominal exam nontender or distended. No masses palpated. Extremities show no edema. neuro grossly intact  ECG Sinus bradycardia at a rate of 54.Right bundle branch block. Left anterior fascicular block. Inferior lateral T-wave inversion.

## 2014-04-11 NOTE — Patient Instructions (Signed)
Your physician wants you to follow-up in: 6 MONTHS WITH DR CRENSHAW You will receive a reminder letter in the mail two months in advance. If you don't receive a letter, please call our office to schedule the follow-up appointment.  

## 2014-04-11 NOTE — Assessment & Plan Note (Signed)
Patient in sinus rhythm. Continue pindolol.Continue Coumadin. They are not interested in NOAC because of cost. She has more frequent episodes in the future we will consider an antiarrhythmic such as amiodarone.

## 2014-04-11 NOTE — Assessment & Plan Note (Signed)
Blood pressure controlled. Continue present medications. 

## 2014-05-20 ENCOUNTER — Ambulatory Visit (INDEPENDENT_AMBULATORY_CARE_PROVIDER_SITE_OTHER): Payer: Medicare HMO | Admitting: Pharmacist

## 2014-05-20 DIAGNOSIS — Z5181 Encounter for therapeutic drug level monitoring: Secondary | ICD-10-CM

## 2014-05-20 DIAGNOSIS — Z7901 Long term (current) use of anticoagulants: Secondary | ICD-10-CM

## 2014-05-20 DIAGNOSIS — G459 Transient cerebral ischemic attack, unspecified: Secondary | ICD-10-CM

## 2014-05-20 DIAGNOSIS — I4891 Unspecified atrial fibrillation: Secondary | ICD-10-CM

## 2014-05-20 DIAGNOSIS — I480 Paroxysmal atrial fibrillation: Secondary | ICD-10-CM

## 2014-05-20 LAB — POCT INR: INR: 3.1

## 2014-06-07 ENCOUNTER — Other Ambulatory Visit: Payer: Self-pay | Admitting: *Deleted

## 2014-06-07 MED ORDER — PINDOLOL 5 MG PO TABS: 2.5000 mg | ORAL_TABLET | Freq: Two times a day (BID) | ORAL | Status: DC

## 2014-06-07 MED ORDER — WARFARIN SODIUM 5 MG PO TABS: 5.0000 mg | ORAL_TABLET | ORAL | Status: DC

## 2014-06-11 ENCOUNTER — Other Ambulatory Visit: Payer: Self-pay | Admitting: *Deleted

## 2014-06-11 MED ORDER — WARFARIN SODIUM 5 MG PO TABS: 5.0000 mg | ORAL_TABLET | ORAL | Status: DC

## 2014-07-04 ENCOUNTER — Ambulatory Visit (INDEPENDENT_AMBULATORY_CARE_PROVIDER_SITE_OTHER): Payer: Medicare HMO

## 2014-07-04 DIAGNOSIS — Z7901 Long term (current) use of anticoagulants: Secondary | ICD-10-CM

## 2014-07-04 DIAGNOSIS — I480 Paroxysmal atrial fibrillation: Secondary | ICD-10-CM

## 2014-07-04 DIAGNOSIS — G459 Transient cerebral ischemic attack, unspecified: Secondary | ICD-10-CM

## 2014-07-04 DIAGNOSIS — Z5181 Encounter for therapeutic drug level monitoring: Secondary | ICD-10-CM

## 2014-07-04 LAB — POCT INR: INR: 2.5

## 2014-07-10 ENCOUNTER — Ambulatory Visit: Payer: Self-pay | Admitting: Internal Medicine

## 2014-07-18 ENCOUNTER — Ambulatory Visit: Payer: Self-pay | Admitting: Internal Medicine

## 2014-07-18 ENCOUNTER — Ambulatory Visit (INDEPENDENT_AMBULATORY_CARE_PROVIDER_SITE_OTHER): Payer: Medicare HMO | Admitting: *Deleted

## 2014-07-18 DIAGNOSIS — Z23 Encounter for immunization: Secondary | ICD-10-CM

## 2014-08-15 ENCOUNTER — Ambulatory Visit (INDEPENDENT_AMBULATORY_CARE_PROVIDER_SITE_OTHER): Payer: Medicare HMO | Admitting: Pharmacist Clinician (PhC)/ Clinical Pharmacy Specialist

## 2014-08-15 DIAGNOSIS — Z5181 Encounter for therapeutic drug level monitoring: Secondary | ICD-10-CM

## 2014-08-15 DIAGNOSIS — Z7901 Long term (current) use of anticoagulants: Secondary | ICD-10-CM

## 2014-08-15 DIAGNOSIS — G459 Transient cerebral ischemic attack, unspecified: Secondary | ICD-10-CM

## 2014-08-15 DIAGNOSIS — I480 Paroxysmal atrial fibrillation: Secondary | ICD-10-CM

## 2014-08-15 LAB — POCT INR: INR: 2.3

## 2014-09-24 ENCOUNTER — Ambulatory Visit (INDEPENDENT_AMBULATORY_CARE_PROVIDER_SITE_OTHER): Payer: Medicare HMO | Admitting: *Deleted

## 2014-09-24 DIAGNOSIS — Z7901 Long term (current) use of anticoagulants: Secondary | ICD-10-CM

## 2014-09-24 DIAGNOSIS — G459 Transient cerebral ischemic attack, unspecified: Secondary | ICD-10-CM

## 2014-09-24 DIAGNOSIS — I480 Paroxysmal atrial fibrillation: Secondary | ICD-10-CM

## 2014-09-24 DIAGNOSIS — Z5181 Encounter for therapeutic drug level monitoring: Secondary | ICD-10-CM

## 2014-09-24 LAB — POCT INR: INR: 2.9

## 2014-10-21 ENCOUNTER — Other Ambulatory Visit: Payer: Self-pay

## 2014-10-21 MED ORDER — WARFARIN SODIUM 5 MG PO TABS: ORAL_TABLET | ORAL | Status: DC

## 2014-11-05 ENCOUNTER — Ambulatory Visit (INDEPENDENT_AMBULATORY_CARE_PROVIDER_SITE_OTHER): Payer: Medicare HMO | Admitting: *Deleted

## 2014-11-05 DIAGNOSIS — Z5181 Encounter for therapeutic drug level monitoring: Secondary | ICD-10-CM

## 2014-11-05 DIAGNOSIS — I480 Paroxysmal atrial fibrillation: Secondary | ICD-10-CM

## 2014-11-05 DIAGNOSIS — Z7901 Long term (current) use of anticoagulants: Secondary | ICD-10-CM

## 2014-11-05 DIAGNOSIS — G459 Transient cerebral ischemic attack, unspecified: Secondary | ICD-10-CM

## 2014-11-05 LAB — POCT INR: INR: 2.5

## 2014-12-17 ENCOUNTER — Ambulatory Visit (INDEPENDENT_AMBULATORY_CARE_PROVIDER_SITE_OTHER): Payer: Medicare HMO

## 2014-12-17 DIAGNOSIS — Z5181 Encounter for therapeutic drug level monitoring: Secondary | ICD-10-CM

## 2014-12-17 DIAGNOSIS — Z7901 Long term (current) use of anticoagulants: Secondary | ICD-10-CM | POA: Diagnosis not present

## 2014-12-17 DIAGNOSIS — G459 Transient cerebral ischemic attack, unspecified: Secondary | ICD-10-CM | POA: Diagnosis not present

## 2014-12-17 DIAGNOSIS — I480 Paroxysmal atrial fibrillation: Secondary | ICD-10-CM | POA: Diagnosis not present

## 2014-12-17 LAB — POCT INR: INR: 1.6

## 2014-12-20 ENCOUNTER — Other Ambulatory Visit: Payer: Self-pay | Admitting: Cardiology

## 2014-12-20 MED ORDER — PINDOLOL 5 MG PO TABS: 2.5000 mg | ORAL_TABLET | Freq: Two times a day (BID) | ORAL | Status: DC

## 2014-12-20 NOTE — Telephone Encounter (Signed)
Medication sent to pharmacy. Pt daughter made aware

## 2014-12-20 NOTE — Telephone Encounter (Signed)
°  1. Which medications need to be refilled? Pindolol 2. Which pharmacy is medication to be sent to?[facility] [phone] 3. Do they need a 30 day or 90 day supply? 30  4. Would they like a call back once the medication has been sent to the pharmacy? yes

## 2014-12-22 NOTE — Progress Notes (Signed)
HPI: FU atrial fibrillation; admitted 10/14 with paroxysmal AFib and TIA. Converted to NSR in the ambulance on her way to the ED. She was bradycardic on diltiazem and switched to Pindolol. CHADS2-VASc=5. Echo (06/27/13): EF 55-60%, mild AI, mild to moderate LAE. Carotid US (9/14): 1-39% bilateral ICA. She was placed on coumadin for anticoagulation. Holter monitor in October 2014 showed sinus rhythm, PACs, PVCs and brief PAT. Since she was last seen, she denies dyspnea, chest pain, palpitations or syncope.  Current Outpatient Prescriptions  Medication Sig Dispense Refill  . acetaminophen (TYLENOL) 325 MG tablet Take 325 mg by mouth every 6 (six) hours as needed.    . Cholecalciferol (VITAMIN D PO) Take 2,000 Units by mouth daily.     Marland Kitchen GLUCOSAMINE-CHONDROITIN PO Take 1 tablet by mouth daily.    Marland Kitchen MAGNESIUM OXIDE, ANTACID, PO Take 1 tablet by mouth daily.    . meclizine (ANTIVERT) 25 MG tablet Take 1 tablet (25 mg total) by mouth 3 (three) times daily as needed for dizziness or nausea. 60 tablet 1  . OVER THE COUNTER MEDICATION Preserve vision vitamin    . pindolol (VISKEN) 5 MG tablet Take 0.5 tablets (2.5 mg total) by mouth 2 (two) times daily. 30 tablet 3  . vitamin B-12 (CYANOCOBALAMIN) 100 MCG tablet Take 50 mcg by mouth daily.    Marland Kitchen warfarin (COUMADIN) 5 MG tablet Take As Directed by [facility] 45 tablet 3   No current facility-administered medications for this visit.     Past Medical History  Diagnosis Date  . TIA (transient ischemic attack)     a. with fall 05/2013 -> neg head CT and MRI/MRA;  b. Plavix started.  . A-fib     a. Dx 06/2013;  b. 06/2013 Echo:  EF 55-60%, no reg wma, mild AI, mod dil LA.  . Sinus bradycardia 07/11/2013  . Hyperlipidemia   . Vitamin D deficiency   . Allergy   . GERD (gastroesophageal reflux disease)   . Urinary incontinence     Past Surgical History  Procedure Laterality Date  . Eye surgery Right 1994    CE/IOL  . Eye surgery Left  1998    CE/IOL  . Appendectomy  1936    History   Social History  . Marital Status: Married    Spouse Name: N/A  . Number of Children: N/A  . Years of Education: N/A   Occupational History  . Not on file.   Social History Main Topics  . Smoking status: Never Smoker   . Smokeless tobacco: Never Used  . Alcohol Use: 0.0 oz/week    0 Standard drinks or equivalent per week     Comment: ocassional  . Drug Use: No  . Sexual Activity: Not on file   Other Topics Concern  . Not on file   Social History Narrative   Lives in [place] with husband.  Uses Cane to get around.    ROS: no fevers or chills, productive cough, hemoptysis, dysphasia, odynophagia, melena, hematochezia, dysuria, hematuria, rash, seizure activity, orthopnea, PND, pedal edema, claudication. Remaining systems are negative.  Physical Exam: Well-developed well-nourished in no acute distress.  Skin is warm and dry.  HEENT is normal.  Neck is supple.  Chest is clear to auscultation with normal expansion.  Cardiovascular exam is regular rate and rhythm.  Abdominal exam nontender or distended. No masses palpated. Extremities show no edema. neuro grossly intact  ECG sinus bradycardia at a rate of 54. Right  bundle branch block. Limb lead reversal.

## 2014-12-24 ENCOUNTER — Ambulatory Visit: Payer: Medicare HMO | Admitting: Cardiology

## 2014-12-24 ENCOUNTER — Encounter: Payer: Self-pay | Admitting: Cardiology

## 2014-12-24 ENCOUNTER — Ambulatory Visit (INDEPENDENT_AMBULATORY_CARE_PROVIDER_SITE_OTHER): Payer: Medicare HMO | Admitting: Cardiology

## 2014-12-24 DIAGNOSIS — I480 Paroxysmal atrial fibrillation: Secondary | ICD-10-CM

## 2014-12-24 DIAGNOSIS — G459 Transient cerebral ischemic attack, unspecified: Secondary | ICD-10-CM | POA: Diagnosis not present

## 2014-12-24 DIAGNOSIS — Z7901 Long term (current) use of anticoagulants: Secondary | ICD-10-CM

## 2014-12-24 DIAGNOSIS — Z5181 Encounter for therapeutic drug level monitoring: Secondary | ICD-10-CM

## 2014-12-24 LAB — CBC
HCT: 34.2 % — ABNORMAL LOW (ref 36.0–46.0)
Hemoglobin: 11.7 g/dL — ABNORMAL LOW (ref 12.0–15.0)
MCH: 31 pg (ref 26.0–34.0)
MCHC: 34.2 g/dL (ref 30.0–36.0)
MCV: 90.5 fL (ref 78.0–100.0)
MPV: 9.8 fL (ref 8.6–12.4)
Platelets: 190 10*3/uL (ref 150–400)
RBC: 3.78 MIL/uL — ABNORMAL LOW (ref 3.87–5.11)
RDW: 13.3 % (ref 11.5–15.5)
WBC: 6.7 10*3/uL (ref 4.0–10.5)

## 2014-12-24 LAB — POCT INR: INR: 1.8

## 2014-12-24 MED ORDER — WARFARIN SODIUM 5 MG PO TABS: ORAL_TABLET | ORAL | Status: DC

## 2014-12-24 MED ORDER — PINDOLOL 5 MG PO TABS: 2.5000 mg | ORAL_TABLET | Freq: Two times a day (BID) | ORAL | Status: DC

## 2014-12-24 NOTE — Assessment & Plan Note (Signed)
Patient's blood pressure is mildly elevated.I have asked her to follow this at home and we will add additional medications as needed.

## 2014-12-24 NOTE — Patient Instructions (Signed)
Your physician wants you to follow-up in: ONE YEAR WITH DR CRENSHAW You will receive a reminder letter in the mail two months in advance. If you don't receive a letter, please call our office to schedule the follow-up appointment.   Your physician recommends that you HAVE LAB WORK TODAY 

## 2014-12-24 NOTE — Assessment & Plan Note (Signed)
Patient remains in sinus rhythm. Continue beta blocker. Continue Coumadin. Check hemoglobin.

## 2015-01-07 ENCOUNTER — Encounter: Payer: Self-pay | Admitting: Internal Medicine

## 2015-01-07 ENCOUNTER — Ambulatory Visit (INDEPENDENT_AMBULATORY_CARE_PROVIDER_SITE_OTHER): Payer: Medicare HMO | Admitting: Internal Medicine

## 2015-01-07 VITALS — BP 148/72 | HR 64 | Temp 97.5°F | Resp 16 | Ht 63.5 in | Wt 143.0 lb

## 2015-01-07 DIAGNOSIS — Z7901 Long term (current) use of anticoagulants: Secondary | ICD-10-CM

## 2015-01-07 DIAGNOSIS — K219 Gastro-esophageal reflux disease without esophagitis: Secondary | ICD-10-CM

## 2015-01-07 DIAGNOSIS — I10 Essential (primary) hypertension: Secondary | ICD-10-CM

## 2015-01-07 DIAGNOSIS — Z5181 Encounter for therapeutic drug level monitoring: Secondary | ICD-10-CM

## 2015-01-07 DIAGNOSIS — Z1212 Encounter for screening for malignant neoplasm of rectum: Secondary | ICD-10-CM

## 2015-01-07 DIAGNOSIS — E559 Vitamin D deficiency, unspecified: Secondary | ICD-10-CM

## 2015-01-07 DIAGNOSIS — R7303 Prediabetes: Secondary | ICD-10-CM

## 2015-01-07 DIAGNOSIS — Z9181 History of falling: Secondary | ICD-10-CM

## 2015-01-07 DIAGNOSIS — I480 Paroxysmal atrial fibrillation: Secondary | ICD-10-CM

## 2015-01-07 DIAGNOSIS — R6889 Other general symptoms and signs: Secondary | ICD-10-CM

## 2015-01-07 DIAGNOSIS — Z0000 Encounter for general adult medical examination without abnormal findings: Secondary | ICD-10-CM

## 2015-01-07 DIAGNOSIS — Z0001 Encounter for general adult medical examination with abnormal findings: Secondary | ICD-10-CM

## 2015-01-07 DIAGNOSIS — Z1331 Encounter for screening for depression: Secondary | ICD-10-CM

## 2015-01-07 DIAGNOSIS — E785 Hyperlipidemia, unspecified: Secondary | ICD-10-CM

## 2015-01-07 DIAGNOSIS — R7309 Other abnormal glucose: Secondary | ICD-10-CM

## 2015-01-07 LAB — CBC WITH DIFFERENTIAL/PLATELET
BASOS PCT: 1 % (ref 0–1)
Basophils Absolute: 0.1 10*3/uL (ref 0.0–0.1)
EOS PCT: 4 % (ref 0–5)
Eosinophils Absolute: 0.3 10*3/uL (ref 0.0–0.7)
HEMATOCRIT: 35.2 % — AB (ref 36.0–46.0)
HEMOGLOBIN: 11.9 g/dL — AB (ref 12.0–15.0)
LYMPHS ABS: 2.1 10*3/uL (ref 0.7–4.0)
LYMPHS PCT: 33 % (ref 12–46)
MCH: 30.4 pg (ref 26.0–34.0)
MCHC: 33.8 g/dL (ref 30.0–36.0)
MCV: 90 fL (ref 78.0–100.0)
MONO ABS: 0.8 10*3/uL (ref 0.1–1.0)
MPV: 9.6 fL (ref 8.6–12.4)
Monocytes Relative: 12 % (ref 3–12)
NEUTROS PCT: 50 % (ref 43–77)
Neutro Abs: 3.3 10*3/uL (ref 1.7–7.7)
Platelets: 206 10*3/uL (ref 150–400)
RBC: 3.91 MIL/uL (ref 3.87–5.11)
RDW: 13.2 % (ref 11.5–15.5)
WBC: 6.5 10*3/uL (ref 4.0–10.5)

## 2015-01-07 LAB — HEPATIC FUNCTION PANEL
ALT: 13 U/L (ref 0–35)
AST: 18 U/L (ref 0–37)
Albumin: 4 g/dL (ref 3.5–5.2)
Alkaline Phosphatase: 44 U/L (ref 39–117)
Bilirubin, Direct: 0.1 mg/dL (ref 0.0–0.3)
Indirect Bilirubin: 0.3 mg/dL (ref 0.2–1.2)
Total Bilirubin: 0.4 mg/dL (ref 0.2–1.2)
Total Protein: 6.6 g/dL (ref 6.0–8.3)

## 2015-01-07 LAB — HEMOGLOBIN A1C
Hgb A1c MFr Bld: 5.7 % — ABNORMAL HIGH (ref <5.7)
Mean Plasma Glucose: 117 mg/dL — ABNORMAL HIGH (ref <117)

## 2015-01-07 LAB — BASIC METABOLIC PANEL WITH GFR
BUN: 12 mg/dL (ref 6–23)
CALCIUM: 8.9 mg/dL (ref 8.4–10.5)
CHLORIDE: 100 meq/L (ref 96–112)
CO2: 25 mEq/L (ref 19–32)
Creat: 1.04 mg/dL (ref 0.50–1.10)
GFR, EST NON AFRICAN AMERICAN: 47 mL/min — AB
GFR, Est African American: 55 mL/min — ABNORMAL LOW
GLUCOSE: 85 mg/dL (ref 70–99)
POTASSIUM: 4.6 meq/L (ref 3.5–5.3)
SODIUM: 135 meq/L (ref 135–145)

## 2015-01-07 LAB — LIPID PANEL
Cholesterol: 136 mg/dL (ref 0–200)
HDL: 47 mg/dL (ref >=46)
LDL Cholesterol: 58 mg/dL (ref 0–99)
Total CHOL/HDL Ratio: 2.9 Ratio
Triglycerides: 154 mg/dL — ABNORMAL HIGH (ref <150)
VLDL: 31 mg/dL (ref 0–40)

## 2015-01-07 LAB — MAGNESIUM: Magnesium: 2 mg/dL (ref 1.5–2.5)

## 2015-01-07 LAB — TSH: TSH: 0.631 u[IU]/mL (ref 0.350–4.500)

## 2015-01-07 NOTE — Progress Notes (Signed)
Patient ID: Erica Carroll, female   DOB: 06-25-1922, 81 y.o.   MRN: [id number]  [facility] VISIT AND CPE  Assessment:   1. Essential hypertension  - Microalbumin / creatinine urine ratio - TSH  2. Hyperlipidemia  - Lipid panel  3. Prediabetes  - Hemoglobin A1c - Insulin, random  4. Vitamin D deficiency  - Vit D  25 hydroxy   5. Gastroesophageal reflux disease   6. Paroxysmal atrial fibrillation   7. Long term current use of anticoagulant therapy  - Protime-INR  8. Encounter for therapeutic drug monitoring  - Urine Microscopic - CBC with Differential/Platelet - BASIC METABOLIC PANEL WITH GFR - Hepatic function panel - Magnesium  9. Screening for rectal cancer  - POC Hemoccult Bld/Stl (3-Cd Home Screen); Future  10. Depression screen   11. At low risk for fall   Plan:   During the course of the visit the patient was educated and counseled about appropriate screening and preventive services including:    Pneumococcal vaccine   Influenza vaccine  Td vaccine  Screening electrocardiogram  Bone densitometry screening  Colorectal cancer screening  Diabetes screening  Glaucoma screening  Nutrition counseling   Advanced directives: requested  Screening recommendations, referrals: Vaccinations:  Immunization History  Administered Date(s) Administered  . Influenza, High Dose Seasonal PF 07/18/2014  . Influenza,inj,Quad PF,36+ Mos 07/26/2013  . Influenza-Unspecified 05/28/2012  . Pneumococcal-Unspecified 11/25/2008  . Td 09/13/2006  Prevnar vaccine ordered Shingles vaccine undecided Hep B vaccine not indicated  Nutrition assessed and recommended  Colonoscopy declined by patient, then deferred due to age.  Recommended yearly ophthalmology/optometry visit for glaucoma screening and checkup Recommended yearly dental visit for hygiene and checkup Advanced directives - yes  Conditions/risks identified: BMI: Discussed weight  loss, diet, and increase physical activity.  Increase physical activity: AHA recommends 150 minutes of physical activity a week.  Medications reviewed PreDiabetes is at goal, ACE/ARB therapy: Not Indicated Urinary Incontinence is not an issue: discussed non pharmacology and pharmacology options.  Fall risk: low- discussed PT, home fall assessment, medications.    FU atrial fibrillation; admitted 10/14 with paroxysmal AFib and TIA. Converted to NSR in the ambulance on her way to the ED. She was bradycardic on diltiazem and switched to Pindolol. CHADS2-VASc=5.  She was placed on coumadin for anticoagulation. Holter monitor in October 2014 showed sinus rhythm, PACs, PVCs and brief PAT.   Subjective:    Erica Carroll is a 81 y.o.  WWF who presents for Medicare Annual Wellness Visit and complete physical.  Date of last medicare wellness visit is unknown. This very nice 81 y.o.female presents for  follow up with Hypertension, Hyperlipidemia, Pre-Diabetes and Vitamin D Deficiency.    Patient is monitored expectantly x years for labile HTN & BP has been controlled at home. Today's BP: (!) 148/72 mmHg. In Oct 2014 patient had a TIA with pAfib in transient to the ER and converted spontaneously and has been on Coumadin since. At that time 2D echo showed  EF 55-60%, mild AI, mild to moderate LAE. Carotid US showed insignificant changes. Patient has had no complaints of any cardiac type chest pain, palpitations, dyspnea/orthopnea/PND, dizziness, claudication, or dependent edema.   Hyperlipidemia is controlled with diet. Last Lipids were at goal.   Lab Results  Component Value Date   CHOL 120 01/03/2014   HDL 47 01/03/2014   LDLCALC 46 01/03/2014   TRIG 136 01/03/2014   CHOLHDL 2.6 01/03/2014    Also, the patient has history  of PreDiabetes since 2012 with an A1c 5.7% and has had no symptoms of reactive hypoglycemia, diabetic polys, paresthesias or visual blurring.  Last A1c was 5.7% in April 2014.      Further, the patient also has history of Vitamin D Deficiency of 18 in 2010 and supplements vitamin D without any suspected side-effects. Last vitamin D was 64 in April 2014.   Names of Other Physician/Practitioners you currently use: 1. [facility] here for primary care 2. Dr Katy Fitch, eye doctor, last visit 2015 3. Dr Jerold Coombe, DDS, dentist, last visit 2015  Patient Care Team: Unk Pinto, MD as PCP - General (Internal Medicine) [name], MD as Consulting Physician (Cardiology) Clent Jacks, MD as Consulting Physician (Ophthalmology)  Medication Review: Medication Sig  . acetaminophen (TYLENOL) 325 MG tablet Take 325 mg by mouth every 6 (six) hours as needed.  . Cholecalciferol (VITAMIN D PO) Take 2,000 Units by mouth daily.   Marland Kitchen GLUCOSAMINE-CHONDROITIN PO Take 1 tablet by mouth daily.  Marland Kitchen MAGNESIUM OXIDE, ANTACID, PO Take 1 tablet by mouth daily.  Marland Kitchen OVER THE COUNTER MEDICATION Preserve vision vitamin  . pindolol (VISKEN) 5 MG tablet Take 0.5 tablets (2.5 mg total) by mouth 2 (two) times daily.  . vitamin B-12 (CYANOCOBALAMIN) 100 MCG tablet Take 50 mcg by mouth daily.  Marland Kitchen warfarin (COUMADIN) 5 MG tablet Take As Directed by Coumadin Clinic   Current Problems (verified) Patient Active Problem List   Diagnosis Date Noted  . Essential hypertension 01/03/2014  . Prediabetes 01/03/2014  . Encounter for therapeutic drug monitoring 10/31/2013  . Hyperlipidemia   . Vitamin D deficiency   . Allergy   . Long term current use of anticoagulant therapy 07/16/2013  . Sinus bradycardia 07/11/2013  . Paroxysmal atrial fibrillation 07/10/2013  . GERD (gastroesophageal reflux disease) 07/10/2013  . TIA (transient ischemic attack) 06/25/2013   Screening Tests Health Maintenance  Topic Date Due  . COLONOSCOPY  08/02/1974  . ZOSTAVAX  08/02/1984  . DEXA SCAN  08/02/1989  . PNA vac Low Risk Adult (2 of 2 - PCV13) 11/25/2009  . INFLUENZA  VACCINE  04/28/2015  . TETANUS/TDAP  09/13/2016   Immunization History  Administered Date(s) Administered  . Influenza, High Dose Seasonal PF 07/18/2014  . Influenza,inj,Quad PF,36+ Mos 07/26/2013  . Influenza-Unspecified 05/28/2012  . Pneumococcal-Unspecified 11/25/2008  . Td 09/13/2006   Preventative care: Last colonoscopy: resused in past by patient  History reviewed: allergies, current medications, past family history, past medical history, past social history, past surgical history and problem list  Risk Factors: Tobacco History  Substance Use Topics  . Smoking status: Never Smoker   . Smokeless tobacco: Never Used  . Alcohol Use: 0.0 oz/week    0 Standard drinks or equivalent per week     Comment: ocassional-rarely   She does not smoke.  Patient is not a former smoker. Are there smokers in your home (other than you)?  No Alcohol Current alcohol use: none  Caffeine Current caffeine use: denies use  Exercise Current exercise: walking and yard work  Nutrition/Diet Current diet: in general, a "healthy" diet    Cardiac risk factors: advanced age (older than 22 for men, 34 for women), dyslipidemia, hypertension and sedentary lifestyle.  Depression Screen (Note: if answer to either of the following is "Yes", a more complete depression screening is indicated)   Q1: Over the past two weeks, have you felt down, depressed or hopeless? No  Q2: Over the past two  weeks, have you felt little interest or pleasure in doing things? No  Have you lost interest or pleasure in daily life? No  Do you often feel hopeless? No  Do you cry easily over simple problems? No  Activities of Daily Living In your present state of health, do you have any difficulty performing the following activities?:  Driving? No Managing money?  No Feeding yourself? No Getting from bed to chair? No Climbing a flight of stairs? No Preparing food and eating?: No Bathing or showering? No Getting  dressed: No Getting to the toilet? No Using the toilet:No Moving around from place to place: No In the past year have you fallen or had a near fall?:No   Are you sexually active?  No  Do you have more than one partner?  No  Vision Difficulties: No  Hearing Difficulties: No Do you often ask people to speak up or repeat themselves? No Do you experience ringing or noises in your ears? No Do you have difficulty understanding soft or whispered voices? Sometimes.  Cognition  Do you feel that you have a problem with memory?No  Do you often misplace items? No  Do you feel safe at home?  Yes  Advanced directives Does patient have a [place]? Yes Does patient have a Living Will? Yes  Past Medical History  Diagnosis Date  . TIA (transient ischemic attack)     a. with fall 05/2013 -> neg head CT and MRI/MRA;  b. Plavix started.  . A-fib     a. Dx 06/2013;  b. 06/2013 Echo:  EF 55-60%, no reg wma, mild AI, mod dil LA.  . Sinus bradycardia 07/11/2013  . Hyperlipidemia   . Vitamin D deficiency   . Allergy   . GERD (gastroesophageal reflux disease)   . Urinary incontinence    Past Surgical History  Procedure Laterality Date  . Eye surgery Right 1994    CE/IOL  . Eye surgery Left 1998    CE/IOL  . Appendectomy  1936   ROS: Constitutional: Denies fever, chills, weight loss/gain, headaches, insomnia, fatigue, night sweats, and change in appetite. Eyes: Denies redness, blurred vision, diplopia, discharge, itchy, watery eyes.  ENT: Denies discharge, congestion, post nasal drip, epistaxis, sore throat, earache, hearing loss, dental pain, Tinnitus, Vertigo, Sinus pain, snoring.  Cardio: Denies chest pain, palpitations, irregular heartbeat, syncope, dyspnea, diaphoresis, orthopnea, PND, claudication, edema Respiratory: denies cough, dyspnea, DOE, pleurisy, hoarseness, laryngitis, wheezing.  Gastrointestinal: Denies dysphagia, heartburn, reflux, water brash, pain,  cramps, nausea, vomiting, bloating, diarrhea, constipation, hematemesis, melena, hematochezia, jaundice, hemorrhoids Genitourinary: Denies dysuria, frequency, urgency, nocturia, hesitancy, discharge, hematuria, flank pain Breast: Breast lumps, nipple discharge, bleeding.  Musculoskeletal: Denies arthralgia, myalgia, stiffness, Jt. Swelling, pain, limp, and strain/sprain. Denies falls. Skin: Denies puritis, rash, hives, warts, acne, eczema, changing in skin lesion Neuro: No weakness, tremor, incoordination, spasms, paresthesia, pain Psychiatric: Denies confusion, memory loss, sensory loss. Denies Depression. Endocrine: Denies change in weight, skin, hair change, nocturia, and paresthesia, diabetic polys, visual blurring, hyper / hypo glycemic episodes.  Heme/Lymph: No excessive bleeding, bruising, enlarged lymph nodes  Objective:     BP 148/72   Pulse 64  Temp 97.5 F   Resp 16  Ht 5' 3.5"   Wt 143 lb    BMI 24.93  General Appearance: Well nourished, alert, WD/WN, female and in no apparent distress. Eyes: PERRLA, EOMs, conjunctiva no swelling or erythema, normal fundi and vessels. Sinuses: No frontal/maxillary tenderness ENT/Mouth: EACs patent / TMs  nl. Nares clear without erythema, swelling, mucoid exudates. Oral hygiene is good. No erythema, swelling, or exudate. Tongue normal, non-obstructing. Tonsils not swollen or erythematous. Hearing normal.  Neck: Supple, thyroid normal. No bruits, nodes or JVD. Respiratory: Respiratory effort normal.  BS equal and clear bilateral without rales, rhonci, wheezing or stridor. Cardio: Heart sounds are normal with regular rate and rhythm and no murmurs, rubs or gallops. Peripheral pulses are normal and equal bilaterally without edema. No aortic or femoral bruits. Chest: symmetric with normal excursions and percussion. Breasts: Symmetric, without lumps, nipple discharge, retractions, or fibrocystic changes.  Abdomen: Flat, soft  with nl bowel sounds.  Nontender, no guarding, rebound, hernias, masses, or organomegaly.  Lymphatics: Non tender without lymphadenopathy.  Genitourinary:  Musculoskeletal: Full ROM all peripheral extremities, joint stability, 5/5 strength, and normal gait. Skin: Warm and dry without rashes, lesions, cyanosis, clubbing or  ecchymosis.  Neuro: Cranial nerves intact, reflexes equal bilaterally. Normal muscle tone, no cerebellar symptoms. Sensation intact.  Pysch: Alert and oriented X 3, normal affect, Insight and Judgment appropriate.   Cognitive Testing  Alert? Yes  Normal Appearance?Yes  Oriented to person? Yes  Place? Yes   Time? Yes  Recall of three objects?  Yes  Can perform simple calculations? Yes  Displays appropriate judgment? Yes  Can read the correct time from a watch/clock?Yes  Medicare Attestation I have personally reviewed: The patient's medical and social history Their use of alcohol, tobacco or illicit drugs Their current medications and supplements The patient's functional ability including ADLs,fall risks, home safety risks, cognitive, and hearing and visual impairment Diet and physical activities Evidence for depression or mood disorders  The patient's weight, height, BMI, and visual acuity have been recorded in the chart.  I have made referrals, counseling, and provided education to the patient based on review of the above and I have provided the patient with a written personalized care plan for preventive services.  Over 40 minutes of exam, counseling, chart review was performed.   Jakai Risse DAVID, MD   01/07/2015

## 2015-01-07 NOTE — Patient Instructions (Signed)

## 2015-01-08 LAB — MICROALBUMIN / CREATININE URINE RATIO
Creatinine, Urine: 44.4 mg/dL
MICROALB UR: 0.6 mg/dL (ref <2.0)
Microalb Creat Ratio: 13.5 mg/g (ref 0.0–30.0)

## 2015-01-08 LAB — PROTIME-INR
INR: 2.65 — ABNORMAL HIGH (ref <1.50)
Prothrombin Time: 28.3 seconds — ABNORMAL HIGH (ref 11.6–15.2)

## 2015-01-08 LAB — INSULIN, RANDOM: INSULIN: 5.5 u[IU]/mL (ref 2.0–19.6)

## 2015-01-08 LAB — VITAMIN D 25 HYDROXY (VIT D DEFICIENCY, FRACTURES): Vit D, 25-Hydroxy: 45 ng/mL (ref 30–100)

## 2015-01-08 LAB — URINALYSIS, MICROSCOPIC ONLY
Bacteria, UA: NONE SEEN
CRYSTALS: NONE SEEN
Casts: NONE SEEN
SQUAMOUS EPITHELIAL / LPF: NONE SEEN

## 2015-01-16 ENCOUNTER — Telehealth: Payer: Self-pay | Admitting: Cardiology

## 2015-01-16 DIAGNOSIS — I480 Paroxysmal atrial fibrillation: Secondary | ICD-10-CM

## 2015-01-16 MED ORDER — PINDOLOL 5 MG PO TABS: 2.5000 mg | ORAL_TABLET | Freq: Two times a day (BID) | ORAL | Status: DC

## 2015-01-16 NOTE — Telephone Encounter (Signed)
Left message for dtr refill is complete.

## 2015-01-16 NOTE — Telephone Encounter (Signed)
[name] states that she received a paper prescription for her mother's Pindolol 5 mg 1/2 tab bid #30 with 3 refills, but she needs to have this called in to [facility].  Pleas let her know when this is done.

## 2015-01-27 ENCOUNTER — Other Ambulatory Visit: Payer: Self-pay | Admitting: *Deleted

## 2015-01-27 DIAGNOSIS — Z1212 Encounter for screening for malignant neoplasm of rectum: Secondary | ICD-10-CM

## 2015-01-27 LAB — POC HEMOCCULT BLD/STL (HOME/3-CARD/SCREEN)
Card #2 Fecal Occult Blod, POC: NEGATIVE
FECAL OCCULT BLD: NEGATIVE
Fecal Occult Blood, POC: NEGATIVE

## 2015-01-28 ENCOUNTER — Inpatient Hospital Stay (HOSPITAL_COMMUNITY)
Admission: EM | Admit: 2015-01-28 | Discharge: 2015-01-29 | DRG: 309 | Disposition: A | Discharge status: Home / Self Care | Payer: Medicare HMO | Attending: Cardiology | Admitting: Cardiology

## 2015-01-28 ENCOUNTER — Emergency Department (HOSPITAL_COMMUNITY): Payer: Medicare HMO

## 2015-01-28 ENCOUNTER — Encounter (HOSPITAL_COMMUNITY): Payer: Self-pay | Admitting: Vascular Surgery

## 2015-01-28 DIAGNOSIS — E860 Dehydration: Secondary | ICD-10-CM | POA: Diagnosis present

## 2015-01-28 DIAGNOSIS — S0003XA Contusion of scalp, initial encounter: Secondary | ICD-10-CM | POA: Diagnosis present

## 2015-01-28 DIAGNOSIS — Z9841 Cataract extraction status, right eye: Secondary | ICD-10-CM

## 2015-01-28 DIAGNOSIS — Z961 Presence of intraocular lens: Secondary | ICD-10-CM | POA: Diagnosis present

## 2015-01-28 DIAGNOSIS — E559 Vitamin D deficiency, unspecified: Secondary | ICD-10-CM | POA: Diagnosis present

## 2015-01-28 DIAGNOSIS — Z9842 Cataract extraction status, left eye: Secondary | ICD-10-CM | POA: Diagnosis not present

## 2015-01-28 DIAGNOSIS — N183 Chronic kidney disease, stage 3 (moderate): Secondary | ICD-10-CM

## 2015-01-28 DIAGNOSIS — K219 Gastro-esophageal reflux disease without esophagitis: Secondary | ICD-10-CM | POA: Diagnosis present

## 2015-01-28 DIAGNOSIS — Y93E9 Activity, other interior property and clothing maintenance: Secondary | ICD-10-CM

## 2015-01-28 DIAGNOSIS — N179 Acute kidney failure, unspecified: Secondary | ICD-10-CM | POA: Diagnosis present

## 2015-01-28 DIAGNOSIS — Z79899 Other long term (current) drug therapy: Secondary | ICD-10-CM | POA: Diagnosis not present

## 2015-01-28 DIAGNOSIS — I480 Paroxysmal atrial fibrillation: Secondary | ICD-10-CM | POA: Diagnosis present

## 2015-01-28 DIAGNOSIS — Z8249 Family history of ischemic heart disease and other diseases of the circulatory system: Secondary | ICD-10-CM

## 2015-01-28 DIAGNOSIS — I4891 Unspecified atrial fibrillation: Secondary | ICD-10-CM | POA: Diagnosis not present

## 2015-01-28 DIAGNOSIS — Z8673 Personal history of transient ischemic attack (TIA), and cerebral infarction without residual deficits: Secondary | ICD-10-CM

## 2015-01-28 DIAGNOSIS — R9431 Abnormal electrocardiogram [ECG] [EKG]: Secondary | ICD-10-CM | POA: Diagnosis not present

## 2015-01-28 DIAGNOSIS — W19XXXA Unspecified fall, initial encounter: Secondary | ICD-10-CM | POA: Diagnosis present

## 2015-01-28 DIAGNOSIS — N1832 Chronic kidney disease, stage 3b: Secondary | ICD-10-CM

## 2015-01-28 DIAGNOSIS — R7309 Other abnormal glucose: Secondary | ICD-10-CM | POA: Diagnosis present

## 2015-01-28 DIAGNOSIS — R404 Transient alteration of awareness: Secondary | ICD-10-CM | POA: Diagnosis present

## 2015-01-28 DIAGNOSIS — R001 Bradycardia, unspecified: Principal | ICD-10-CM | POA: Diagnosis present

## 2015-01-28 DIAGNOSIS — Z7901 Long term (current) use of anticoagulants: Secondary | ICD-10-CM

## 2015-01-28 DIAGNOSIS — R4020 Unspecified coma: Secondary | ICD-10-CM

## 2015-01-28 LAB — COMPREHENSIVE METABOLIC PANEL
ALK PHOS: 35 U/L — AB (ref 38–126)
ALT: 17 U/L (ref 14–54)
AST: 23 U/L (ref 15–41)
Albumin: 3.6 g/dL (ref 3.5–5.0)
Anion gap: 8 (ref 5–15)
BILIRUBIN TOTAL: 0.6 mg/dL (ref 0.3–1.2)
BUN: 12 mg/dL (ref 6–20)
CO2: 23 mmol/L (ref 22–32)
Calcium: 8.7 mg/dL — ABNORMAL LOW (ref 8.9–10.3)
Chloride: 106 mmol/L (ref 101–111)
Creatinine, Ser: 1.2 mg/dL — ABNORMAL HIGH (ref 0.44–1.00)
GFR calc Af Amer: 45 mL/min — ABNORMAL LOW (ref >60)
GFR calc non Af Amer: 39 mL/min — ABNORMAL LOW (ref >60)
GLUCOSE: 88 mg/dL (ref 70–99)
POTASSIUM: 3.9 mmol/L (ref 3.5–5.1)
Sodium: 137 mmol/L (ref 135–145)
Total Protein: 6.3 g/dL — ABNORMAL LOW (ref 6.5–8.1)

## 2015-01-28 LAB — CBC WITH DIFFERENTIAL/PLATELET
Basophils Absolute: 0 10*3/uL (ref 0.0–0.1)
Basophils Relative: 0 % (ref 0–1)
Eosinophils Absolute: 0.1 10*3/uL (ref 0.0–0.7)
Eosinophils Relative: 2 % (ref 0–5)
HCT: 36.3 % (ref 36.0–46.0)
Hemoglobin: 12.5 g/dL (ref 12.0–15.0)
LYMPHS PCT: 14 % (ref 12–46)
Lymphs Abs: 1.3 10*3/uL (ref 0.7–4.0)
MCH: 30.5 pg (ref 26.0–34.0)
MCHC: 34.4 g/dL (ref 30.0–36.0)
MCV: 88.5 fL (ref 78.0–100.0)
Monocytes Absolute: 0.7 10*3/uL (ref 0.1–1.0)
Monocytes Relative: 8 % (ref 3–12)
NEUTROS ABS: 6.9 10*3/uL (ref 1.7–7.7)
Neutrophils Relative %: 76 % (ref 43–77)
PLATELETS: 208 10*3/uL (ref 150–400)
RBC: 4.1 MIL/uL (ref 3.87–5.11)
RDW: 12.8 % (ref 11.5–15.5)
WBC: 9.1 10*3/uL (ref 4.0–10.5)

## 2015-01-28 LAB — PROTIME-INR
INR: 2.33 — AB (ref 0.00–1.49)
PROTHROMBIN TIME: 25.8 s — AB (ref 11.6–15.2)

## 2015-01-28 LAB — BASIC METABOLIC PANEL
ANION GAP: 9 (ref 5–15)
BUN: 10 mg/dL (ref 6–20)
CALCIUM: 9.2 mg/dL (ref 8.9–10.3)
CO2: 24 mmol/L (ref 22–32)
Chloride: 102 mmol/L (ref 101–111)
Creatinine, Ser: 1.21 mg/dL — ABNORMAL HIGH (ref 0.44–1.00)
GFR calc Af Amer: 44 mL/min — ABNORMAL LOW (ref >60)
GFR calc non Af Amer: 38 mL/min — ABNORMAL LOW (ref >60)
Glucose, Bld: 132 mg/dL — ABNORMAL HIGH (ref 70–99)
Potassium: 4.8 mmol/L (ref 3.5–5.1)
SODIUM: 135 mmol/L (ref 135–145)

## 2015-01-28 LAB — TROPONIN I: TROPONIN I: 0.03 ng/mL (ref <0.031)

## 2015-01-28 LAB — MAGNESIUM: Magnesium: 2 mg/dL (ref 1.7–2.4)

## 2015-01-28 LAB — MRSA PCR SCREENING: MRSA by PCR: NEGATIVE

## 2015-01-28 LAB — TSH: TSH: 0.917 u[IU]/mL (ref 0.350–4.500)

## 2015-01-28 LAB — PHOSPHORUS: PHOSPHORUS: 3 mg/dL (ref 2.5–4.6)

## 2015-01-28 MED ORDER — SODIUM CHLORIDE 0.9 % IV SOLN
INTRAVENOUS | Status: AC
  Administered 2015-01-28: 75 mL/h via INTRAVENOUS

## 2015-01-28 MED ORDER — SODIUM CHLORIDE 0.9 % IJ SOLN: 3.0000 mL | Freq: Two times a day (BID) | INTRAMUSCULAR | Status: DC

## 2015-01-28 MED ORDER — WARFARIN - PHARMACIST DOSING INPATIENT: Freq: Every day | Status: DC

## 2015-01-28 MED ORDER — WARFARIN SODIUM 5 MG PO TABS
5.0000 mg | ORAL_TABLET | ORAL | Status: DC
  Filled 2015-01-28: qty 1

## 2015-01-28 MED ORDER — WARFARIN SODIUM 7.5 MG PO TABS
7.5000 mg | ORAL_TABLET | ORAL | Status: DC
  Administered 2015-01-28: 7.5 mg via ORAL
  Filled 2015-01-28 (×2): qty 1

## 2015-01-28 NOTE — Progress Notes (Signed)
ANTICOAGULATION CONSULT NOTE - Initial Consult  Pharmacy Consult for Warfarin Indication: atrial fibrillation  No Known Allergies  Patient Measurements:   Heparin Dosing Weight: n/a   Vital Signs: Temp: 97.9 F (36.6 C) (05/03 1140) Temp Source: Oral (05/03 1140) BP: 126/69 mmHg (05/03 1530) Pulse Rate: 60 (05/03 1530)  Labs:  Recent Labs  01/28/15 1215  HGB 12.5  HCT 36.3  PLT 208  LABPROT 25.8*  INR 2.33*  CREATININE 1.21*  TROPONINI <0.03    CrCl cannot be calculated (Unknown ideal weight.).   Medical History: Past Medical History  Diagnosis Date  . TIA (transient ischemic attack)     a. with fall 05/2013 -> neg head CT and MRI/MRA;  b. Plavix started.  . A-fib     a. Dx 06/2013;  b. 06/2013 Echo:  EF 55-60%, no reg wma, mild AI, mod dil LA.  . Sinus bradycardia 07/11/2013  . Hyperlipidemia   . Vitamin D deficiency   . Allergy   . GERD (gastroesophageal reflux disease)   . Urinary incontinence     Medications:   (Not in a hospital admission)  Assessment: 79 YOF with Afib on Coumadin presents to the ED with bradycardia and feeling lightheaded. Pharmacy consulted to resume home Coumadin. INR on admission is therapeutic at 2.33. Her last dose of Coumadin was yesterday. H/H and Plt wnl  Home Coumadin dose: 7.5 mg on Tues/Sat; 5 mg on all other days   Goal of Therapy:  INR 2-3 Monitor platelets by anticoagulation protocol: Yes   Plan:  -Continue home Coumadin dose. Give dose today -Monitor daily PT/INR   Albertina Parr, PharmD., BCPS Clinical Pharmacist Pager [phone]

## 2015-01-28 NOTE — ED Notes (Signed)
Pt reports to the ED for eval of dizziness. She was leaning over cleaning out a drawer in the bottom of her fridge when she developed dizziness and fell over backwards. Small hematoma noted to the right occipital lobe. Pt denies any syncope or LOC. She is on blood thinners. Pt reporting some continued dizziness. Upon EMS arrival her HR was in the 30s-50s and irregular. 12 lead on arrival shows the same. She recently had her beta blocker dose adjusted. Pt denies any CP or SOB. Pt A&Ox4, resp e/u, and skin warm and dry.

## 2015-01-28 NOTE — H&P (Signed)
Date: 01/28/2015               Patient Name:  Erica Carroll MRN: [id number]  DOB: 26-Mar-1922 Age / Sex: 81 y.o., female   PCP: Unk Pinto, MD         Medical Service: Cardiology         Attending Physician: Fransico Him, MD     Cardiologist:                       Kirk Ruths, MD  Chief Complaint: dizziness  History of Present Illness: Erica Carroll is a 81 year old woman with PMH of atrial fibrillation (on coumadin and pindolol), sinus bradycardia, pre-DM and GERD here with c/o lightheadedness that began this morning.  She was in her usual state of health when she bent over and began to clean out her refrigerator this morning.  She had been in the process of preparing breakfast and noticed the refrigerator needed cleaning.  She bent over and as she began to rise she felt "dizzy."  She describes feeling as if she would pass out.  She denies feeling as if the room was spinning.  She denies chest pain, palpitations, dyspnea, syncope or change in vision during the event.  She says the door of the refrigerator hit the right side of her head as she was trying to get up.  She was able to make it over to the couch and call her daughter.  Per EMS report, she was in AFib with HR was 30s-50s.  She is unsure of how long the lightheaded feeling lasted but says it was still present when she arrived at the ED.  She continues to feel it intermittently in the ED including when she had to transfer stretcher to get into the CT machine.   In the ED:  She is in AFib with HR 50s-60s.  BPs 110s-130s/40s-90s.  Meds: No current facility-administered medications for this encounter.   Current Outpatient Prescriptions  Medication Sig Dispense Refill  . acetaminophen (TYLENOL) 325 MG tablet Take 325 mg by mouth every 6 (six) hours as needed.    . Cholecalciferol (VITAMIN D PO) Take 2,000 Units by mouth daily.     Marland Kitchen GLUCOSAMINE-CHONDROITIN PO Take 1 tablet by mouth daily.    Marland Kitchen MAGNESIUM OXIDE, ANTACID, PO Take 1  tablet by mouth daily.    Marland Kitchen OVER THE COUNTER MEDICATION Preserve vision vitamin    . pindolol (VISKEN) 5 MG tablet Take 0.5 tablets (2.5 mg total) by mouth 2 (two) times daily. 180 tablet 3  . vitamin B-12 (CYANOCOBALAMIN) 100 MCG tablet Take 50 mcg by mouth daily.    Marland Kitchen warfarin (COUMADIN) 5 MG tablet Take As Directed by [facility] 45 tablet 3    Allergies: Allergies as of 01/28/2015  . (No Known Allergies)   Past Medical History  Diagnosis Date  . TIA (transient ischemic attack)     a. with fall 05/2013 -> neg head CT and MRI/MRA;  b. Plavix started.  . A-fib     a. Dx 06/2013;  b. 06/2013 Echo:  EF 55-60%, no reg wma, mild AI, mod dil LA.  . Sinus bradycardia 07/11/2013  . Hyperlipidemia   . Vitamin D deficiency   . Allergy   . GERD (gastroesophageal reflux disease)   . Urinary incontinence    Past Surgical History  Procedure Laterality Date  . Eye surgery Right 1994    CE/IOL  . Eye surgery Left  1998    CE/IOL  . Appendectomy  1936   Family History  Problem Relation Age of Onset  . Heart disease Mother   . Heart disease Sister   . Heart disease Brother   . Heart disease Brother   . CVA Sister   . Thyroid disease Sister    History   Social History  . Marital Status: Married    Spouse Name: N/A  . Number of Children: N/A  . Years of Education: N/A   Occupational History  . Not on file.   Social History Main Topics  . Smoking status: Never Smoker   . Smokeless tobacco: Never Used  . Alcohol Use: 0.0 oz/week    0 Standard drinks or equivalent per week     Comment: ocassional-rarely  . Drug Use: No  . Sexual Activity: Not on file   Other Topics Concern  . Not on file   Social History Narrative   Lives in [place].  Recently widowed after nearly 37 years of marriage.      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 HEENT:  She reports occasional dysphagia  and heartburn when she eats/drinks too quickly.  She denies odynophagia or regurgitation.  She denies change in vision or hearing.   Cardiac:  She occasionally has palpitations but none recently.  She denies chest pain, PND, orthopnea or lower extremity edema. Pulmonary:  She denies cough or dyspnea. GI:  She denies nausea, vomiting, diarrhea or constipation. GU:  She reports nocturia (3-4 times nightly).  She denies difficulty urinating or dysuria.  Physical Exam: Blood pressure 123/51, pulse 64, temperature 97.9 F (36.6 C), temperature source Oral, resp. rate 19, SpO2 100 %. General: sitting up on gurney in NAD, pleasant and cooperative with exam HEENT: pupils equal and round (s/p B/L cataract), EOMI, no scleral icterus, no carotid bruits, no JVD or thyromegaly Cardiac: RRR; no rubs, murmurs or gallops; distal pulses equal and intact Pulm: clear to auscultation bilaterally, moving normal volumes of air; no wheezes, rales or rhonchi Abd: soft, nontender, nondistended, BS present, no HSM Ext: warm and well perfused, no pedal edema, negative Homan's Neuro: alert and oriented X3, cranial nerves II-XII grossly intact, 5/5 MMS upper and lower extremities, sensation intact to light touch B/L  Lab results: Basic Metabolic Panel:  Recent Labs  01/28/15 1215  NA 135  K 4.8  CL 102  CO2 24  GLUCOSE 132*  BUN 10  CREATININE 1.21*  CALCIUM 9.2   CBC:  Recent Labs  01/28/15 1215  WBC 9.1  NEUTROABS 6.9  HGB 12.5  HCT 36.3  MCV 88.5  PLT 208   Cardiac Enzymes:  Recent Labs  01/28/15 1215  TROPONINI <0.03   Coagulation:  Recent Labs  01/28/15 1215  LABPROT 25.8*  INR 2.33*   Imaging results:  Ct Head Wo Contrast  01/28/2015   CLINICAL DATA:  Fall. Patient anticoagulated. Posterior scalp hematoma.  EXAM: CT HEAD WITHOUT CONTRAST  TECHNIQUE: Contiguous axial images were obtained from the base of the skull through the vertex without intravenous contrast.  COMPARISON:   06/26/2013 MR. 06/25/2013 CT head.  FINDINGS: No evidence for acute infarction, hemorrhage, mass lesion, hydrocephalus, or extra-axial fluid. Mild cerebral and cerebellar atrophy not unexpected for the patient's age of 72. Mild white matter disease.  Calvarium is intact. There is no contrecoup injury. No visible subarachnoid blood. No sinus or mastoid air  fluid level.  Large RIGHT parietal scalp hematoma.  IMPRESSION: Large RIGHT frontal scalp hematoma. No underlying skull fracture or intracranial hemorrhage.   Electronically Signed   By: Rolla Flatten M.D.   On: 01/28/2015 13:00   Dg Chest Portable 1 View  01/28/2015   CLINICAL DATA:  [place] today after experiencing dizziness.  Hit head.  EXAM: PORTABLE CHEST - 1 VIEW  COMPARISON:  01/03/2014.  FINDINGS: Cardiomegaly. Aortic atherosclerosis. No infiltrates or failure. No acute osseous findings. Chronic LEFT rib fractures.  IMPRESSION: Cardiomegaly.  No acute findings.   Electronically Signed   By: Rolla Flatten M.D.   On: 01/28/2015 12:48   Other results: EKG:  HR 57, afib, RBBB, TWI II, III, avF, V3-V6  Assessment & Plan by Problem: 81 year old woman with PMH of atrial fibrillation on coumadin and pindolol admitted with lightheadedness.  Symptomatic bradycardia:  Her lightheadedness is likely a symptom of her bradycardia given EKG findings.  There is concern for SSS given the correlation of symptoms with today's abnormal EKG.  TWI on EKG concerning for ischemia but she denies CP history and initial troponin is negative.  No infectious signs or symptoms.  No hx of thyroid disorders. - admit to cardiology  - monitor on telemetry - hold pindolol and monitor for improvement in HR - check orthostatic vitals - continue to trend troponin, check TSH - 2D ECHO - consult to EP and appreciate their recommendations regarding appropriateness of PPM - AM EKG  Paroxysmal atrial fibrillation:  Initially in rate controlled Afib but spontaneously converted to NSR  during my exam.  The patient says she rarely has palpitations and is normally in NSR when she visits the doctor.  She experienced a similar presyncopal episode in 06/2013 during which time she was found to have PAF.  She wore a holter monitor after that admission and it recorded NSR, PACs, PVCs and brief PAT.    - continue Coumadin per pharmacy - holding pindolol due to symptomatic brady  Diffuse TWI:  Inversions in II, III, avF, V3-V6 concerning for prior silent MI.  Inversions in II, avF, V3-V4 were present on prior EKG.  She denies known CAD hx.  She has never smoked and has led an active lifestyle.  She has strong family hx of heart disease and says her parents and all nine of her siblings died from MI or HF.  Initial trop negative.   - trend troponin - repeat EKG in AM  Acute kidney injury:  Cr 1.21.  Baseline is 0.8 - 0.9.  She reports normal po but her children do not think she drinks enough water.  She ate a normal dinner last night but had not yet eaten breakfast when this event occurred this morning.  Likely pre-renal etiology. - IV fluids 75cc/hr x 8 hours  Parietal scalp hematoma:  Noted on CT head.  The scalp is a little pink but no major bruising on physical exam.  She is only minimally TTP of right parietal scalp.  Likely due to the refrigerator door hitting the side of her head and exacerbated by coumadin use.  There are no neurologic deficits.  There is no evidence of skull fracture or intracerebral hemorrhage.   - monitor for worsening - would expect complete resolution  Signed: Francesca Oman, DO 01/28/2015, 2:09 PM

## 2015-01-28 NOTE — Consult Note (Signed)
CARDIOLOGY CONSULT NOTE   Patient ID: FOTINI LEMUS MRN: [id number], DOB/AGE: 1922-09-07   Admit date: 01/28/2015 Date of Consult: 01/28/2015   Primary Physician: Alesia Richards, MD Primary Cardiologist: Dr. Stanford Breed  Pt. Profile  81 year old Caucasian female with past medical history of atrial fibrillation on Coumadin and pindalol, history of sinus bradycardia while on diltiazem, prediabetes and GERD present with presyncope  Problem List  Past Medical History  Diagnosis Date  . TIA (transient ischemic attack)     a. with fall 05/2013 -> neg head CT and MRI/MRA;  b. Plavix started.  . A-fib     a. Dx 06/2013;  b. 06/2013 Echo:  EF 55-60%, no reg wma, mild AI, mod dil LA.  . Sinus bradycardia 07/11/2013  . Hyperlipidemia   . Vitamin D deficiency   . Allergy   . GERD (gastroesophageal reflux disease)   . Urinary incontinence     Past Surgical History  Procedure Laterality Date  . Eye surgery Right 1994    CE/IOL  . Eye surgery Left 1998    CE/IOL  . Appendectomy  1936     Allergies  No Known Allergies  HPI   The patient is a very pleasant 81 year old Caucasian female with past medical history of atrial fibrillation on Coumadin and pindalol, history of sinus bradycardia while on diltiazem, prediabetes and GERD. She has been followed by Dr. Stanford Breed. She was first diagnosed with atrial fibrillation back in October 2014. At the time, she was admitted with TIA after 20 minutes of aphasia and troubles speaking or comprehending speech. She had atrial fibrillation in the ambulance with heart rate in the 130s however resolved prior to hospital arrival. Echocardiogram obtained in 2014 showed EF 55-60%. She was initially discharged on Plavix. She was readmitted 2 weeks later with dizziness and found to have atrial fibrillation with RVR. She was started on diltiazem which caused bradycardia. She was also started on Coumadin given CHA2DS2-Vasc score of 5. She was eventually  transitioned to pindolol 2.5 mg twice a day to avoid bradycardia.  She has been doing well since. She does endorse having episode of mechanical fall over a year and half ago, however she has not had any falls since then. She gets around with a walker. She also appears to be quite active and able to take care of her daily activity without problem. She lives with her son who does majority of the cleaning. 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 She does get up slowly in the morning, however seldomly have dizziness when getting up. She was cleaning out the bottom of her fridge in the morning of 01/28/2015. She had to bend over during the process, however as she began to rise she had significant dizziness and subsequently fell onto the floor. She hit the right side of her head on the fridge door during the event. She called her daughter who contacted EMS. She denies any chest pain during the event. She does endorse history of GERD related to food intake, however no significant event recently. On EMS arrival, she was in atrial fibrillation with heart rate in the 30s to 50s. She was subsequently transferred to [facility] for further evaluation.   While in the hospital, she has been going in and out of atrial fibrillation. While in atrial fibrillation, she appears to have heart rate in the 30s to 50s, however while in sinus rhythm his heart rate has been  maintaining 60s. He was admitted by general cardiology service and pindolol has been discontinued. Her creatinine was mildly elevated concerning for dehydration and she is currently receiving fluid in the ED. When the concern of symptomatic bradycardia, EP has been consulted for consideration of potential pacemaker.   No current facility-administered medications on file prior to encounter.   Current Outpatient Prescriptions on File Prior to Encounter  Medication Sig Dispense Refill  . acetaminophen (TYLENOL) 325  MG tablet Take 325 mg by mouth every 6 (six) hours as needed for mild pain.     . Cholecalciferol (VITAMIN D PO) Take 4,000 Units by mouth daily.     Marland Kitchen GLUCOSAMINE-CHONDROITIN PO Take 1 tablet by mouth daily.    Marland Kitchen MAGNESIUM OXIDE, ANTACID, PO Take 1 tablet by mouth daily.    Marland Kitchen OVER THE COUNTER MEDICATION Preserve vision vitamin    . pindolol (VISKEN) 5 MG tablet Take 0.5 tablets (2.5 mg total) by mouth 2 (two) times daily. 180 tablet 3  . vitamin B-12 (CYANOCOBALAMIN) 100 MCG tablet Take 50 mcg by mouth daily.    Marland Kitchen warfarin (COUMADIN) 5 MG tablet Take As Directed by [facility] (Patient taking differently: 5 mg. Pt takes 5mg  everyday except for Tues, Sat - pt takes 7.5mg  on Tues, Sat) 45 tablet 3    Last Vitals:  Filed Vitals:   01/28/15 1530  BP: 126/69  Pulse: 60  Temp:   Resp: 12     Family History Family History  Problem Relation Age of Onset  . Heart disease Mother   . Heart disease Sister   . Heart disease Brother   . Heart disease Brother   . CVA Sister   . Thyroid disease Sister      Social History History   Social History  . Marital Status: Married    Spouse Name: N/A  . Number of Children: N/A  . Years of Education: N/A   Occupational History  . Not on file.   Social History Main Topics  . Smoking status: Never Smoker   . Smokeless tobacco: Never Used  . Alcohol Use: 0.0 oz/week    0 Standard drinks or equivalent per week     Comment: ocassional-rarely  . Drug Use: No  . Sexual Activity: Not on file   Other Topics Concern  . Not on file   Social History Narrative   Lives in [place].  Recently widowed after nearly 61 years of marriage.       Review of Systems  General:  No chills, fever, night sweats or weight changes.  Cardiovascular:  No chest pain, dyspnea on exertion, edema, orthopnea, palpitations, paroxysmal nocturnal dyspnea. Dermatological: No rash, lesions/masses Respiratory: No cough, dyspnea Urologic: No hematuria,  dysuria Abdominal:   No nausea, vomiting, diarrhea, bright red blood per rectum, melena, or hematemesis Neurologic:  No visual changes. Dizziness, presyncope All other systems reviewed and are otherwise negative except as noted above.  Physical Exam  Blood pressure 126/69, pulse 60, temperature 97.9 F (36.6 C), temperature source Oral, resp. rate 12, SpO2 98 %.  General: Pleasant, NAD Psych: Normal affect. Neuro: Alert and oriented X 3. Moves all extremities spontaneously. HEENT: Normal  Neck: Supple without bruits or JVD. Lungs:  Resp regular and unlabored, CTA. Heart: RRR no s3, s4, or murmurs. Abdomen: Soft, non-tender, non-distended, BS + x 4.  Extremities: No clubbing, cyanosis or edema. DP/PT/Radials 2+ and equal bilaterally.  Labs   Recent Labs  01/28/15 1215  TROPONINI <0.03  Lab Results  Component Value Date   WBC 9.1 01/28/2015   HGB 12.5 01/28/2015   HCT 36.3 01/28/2015   MCV 88.5 01/28/2015   PLT 208 01/28/2015    Recent Labs Lab 01/28/15 1215  NA 135  K 4.8  CL 102  CO2 24  BUN 10  CREATININE 1.21*  CALCIUM 9.2  GLUCOSE 132*   Lab Results  Component Value Date   CHOL 136 01/07/2015   HDL 47 01/07/2015   LDLCALC 58 01/07/2015   TRIG 154* 01/07/2015   No results found for: DDIMER  Radiology/Studies  Ct Head Wo Contrast  01/28/2015   CLINICAL DATA:  Fall. Patient anticoagulated. Posterior scalp hematoma.  EXAM: CT HEAD WITHOUT CONTRAST  TECHNIQUE: Contiguous axial images were obtained from the base of the skull through the vertex without intravenous contrast.  COMPARISON:  06/26/2013 MR. 06/25/2013 CT head.  FINDINGS: No evidence for acute infarction, hemorrhage, mass lesion, hydrocephalus, or extra-axial fluid. Mild cerebral and cerebellar atrophy not unexpected for the patient's age of 25. Mild white matter disease.  Calvarium is intact. There is no contrecoup injury. No visible subarachnoid blood. No sinus or mastoid air fluid level.  Large  RIGHT parietal scalp hematoma.  IMPRESSION: Large RIGHT frontal scalp hematoma. No underlying skull fracture or intracranial hemorrhage.   Electronically Signed   By: Rolla Flatten M.D.   On: 01/28/2015 13:00   Dg Chest Portable 1 View  01/28/2015   CLINICAL DATA:  [place] today after experiencing dizziness.  Hit head.  EXAM: PORTABLE CHEST - 1 VIEW  COMPARISON:  01/03/2014.  FINDINGS: Cardiomegaly. Aortic atherosclerosis. No infiltrates or failure. No acute osseous findings. Chronic LEFT rib fractures.  IMPRESSION: Cardiomegaly.  No acute findings.   Electronically Signed   By: Rolla Flatten M.D.   On: 01/28/2015 12:48    ECG  NSR with RBBB and deep TWI in inferolateral leads  ASSESSMENT AND PLAN  1. 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 Also has rare short burst atrial tach. Can consider antiarrhythmic medication for suppression of a-fib to maintain NSR  - her age is a concern, although likely not absolute contraindication, will need to reassess how she does off pindalol first.   - further recommendation by Dr. [name]  2. Presyncope  - the setting of event suspicious for component of orthostatic hypotension, will assess. However her bradycardia may also contribute to the symptom  3. Abnormal EKG with TWI in inferolateral leads  - significant family Hx of CAD, however patient denies prior MI, pending echo  4. PAF on pindalol and coumadin  - CHA2DS2-Vasc score 3-5 (age, female, +/- TIA)  - pindalol held.  5. Dehydration with mildly elevated Cr 1.2  6. H/o TIA  7. Large R frontal scalp hematoma on CT of head  Signed, Almyra Deforest, PA-C 01/28/2015, 4:22 PM The patient was seen 01/29/15. The patient has a history of tachybradycardia syndrome with atrial fibrillation rates documented greater than 150. She has however over the last 18 months on low-dose pindolol had no symptoms. Her episode of  syncope was associated with bending. She was found to be in slow atrial fibrillation. The causal relationship is not clear. The patient is reluctant to undergo pacing.  We have elected to allow her to be discharged. She will be discharged without ongoing pindolol therapy. She'll be given a 30 day event recorder to look  for a burden of bradycardia and atrial fibrillation. She will continue to consider the possibility of backup bradycardia pacing for tachybradycardia syndrome.  This was reviewed extensively with her and her daughter.  Telemetry today demonstrates sinus pauses as well as slow ventricular response and atrial fibrillation.

## 2015-01-28 NOTE — ED Provider Notes (Signed)
CSN: [id number]     Arrival date & time 01/28/15  1131 History   First MD Initiated Contact with Patient 01/28/15 1135     Chief Complaint  Patient presents with  . Dizziness     (Consider location/radiation/quality/duration/timing/severity/associated sxs/prior Treatment) HPI Comments: Patient had near syncopal episode while leaning over to clean out her refrigerator when she developed dizziness and lightheadedness and fell backwards hitting her head. Denies actually losing consciousness. She is on Coumadin for history of atrial fibrillation. EMS found her heart rate in the 30s to 50s, slow atrial fibrillation. She denies chest pain or shortness of breath. She is on pindolol and Coumadin. She denies any other medications. She denies any focal weakness, numbness or tingling. She denies any nausea or vomiting. No diarrhea. No bowel or bladder incontinence. No cough.  The history is provided by the patient and the EMS personnel. The history is limited by the condition of the patient.    Past Medical History  Diagnosis Date  . TIA (transient ischemic attack)     a. with fall 05/2013 -> neg head CT and MRI/MRA;  b. Plavix started.  . A-fib     a. Dx 06/2013;  b. 06/2013 Echo:  EF 55-60%, no reg wma, mild AI, mod dil LA.  . Sinus bradycardia 07/11/2013  . Hyperlipidemia   . Vitamin D deficiency   . Allergy   . GERD (gastroesophageal reflux disease)   . Urinary incontinence    Past Surgical History  Procedure Laterality Date  . Eye surgery Right 1994    CE/IOL  . Eye surgery Left 1998    CE/IOL  . Appendectomy  1936   Family History  Problem Relation Age of Onset  . Heart disease Mother   . Heart disease Sister   . Heart disease Brother   . Heart disease Brother   . CVA Sister   . Thyroid disease Sister    History  Substance Use Topics  . Smoking status: Never Smoker   . Smokeless tobacco: Never Used  . Alcohol Use: 0.0 oz/week    0 Standard drinks or equivalent per week      Comment: ocassional-rarely   OB History    No data available     Review of Systems  Constitutional: Negative for fever and activity change.  HENT: Negative for congestion and rhinorrhea.   Respiratory: Negative for cough and chest tightness.   Cardiovascular: Negative for chest pain and leg swelling.  Gastrointestinal: Negative for nausea, vomiting and abdominal pain.  Genitourinary: Negative for dysuria, hematuria, vaginal bleeding and vaginal discharge.  Musculoskeletal: Negative for myalgias and arthralgias.  Skin: Negative for rash.  Neurological: Positive for dizziness, syncope and light-headedness.  A complete 10 system review of systems was obtained and all systems are negative except as noted in the HPI and PMH.      Allergies  Review of patient's allergies indicates no known allergies.  Home Medications   Prior to Admission medications   Medication Sig Start Date End Date Taking? Authorizing Provider  acetaminophen (TYLENOL) 325 MG tablet Take 325 mg by mouth every 6 (six) hours as needed for mild pain.    Yes Historical Provider, MD  Cholecalciferol (VITAMIN D PO) Take 4,000 Units by mouth daily.    Yes Historical Provider, MD  GLUCOSAMINE-CHONDROITIN PO Take 1 tablet by mouth daily.   Yes Historical Provider, MD  MAGNESIUM OXIDE, ANTACID, PO Take 1 tablet by mouth daily.   Yes Historical Provider, MD  OVER THE COUNTER MEDICATION Preserve vision vitamin   Yes Historical Provider, MD  pindolol (VISKEN) 5 MG tablet Take 0.5 tablets (2.5 mg total) by mouth 2 (two) times daily. 01/16/15 01/17/16 Yes [name], MD  vitamin B-12 (CYANOCOBALAMIN) 100 MCG tablet Take 50 mcg by mouth daily.   Yes Historical Provider, MD  warfarin (COUMADIN) 5 MG tablet Take As Directed by [facility] Patient taking differently: 5 mg. Pt takes 5mg  everyday except for Tues, Sat - pt takes 7.5mg  on Tues, Sat 12/24/14  Yes [name], MD   BP 110/66 mmHg  Pulse 60  Temp(Src) 97.9 F  (36.6 C) (Oral)  Resp 16  SpO2 99% Physical Exam  Constitutional: She is oriented to person, place, and time. She appears well-developed and well-nourished. No distress.  HENT:  Head: Normocephalic and atraumatic.  Mouth/Throat: Oropharynx is clear and moist. No oropharyngeal exudate.  Occipital hematoma  Eyes: Conjunctivae and EOM are normal. Pupils are equal, round, and reactive to light.  Neck: Normal range of motion. Neck supple.  No C spine tenderness  Cardiovascular: Normal rate, normal heart sounds and intact distal pulses.   No murmur heard. Irregular bradycardia  Pulmonary/Chest: Effort normal and breath sounds normal. No respiratory distress.  Abdominal: Soft. There is no tenderness. There is no rebound and no guarding.  Musculoskeletal: Normal range of motion. She exhibits no edema or tenderness.  Neurological: She is alert and oriented to person, place, and time. No cranial nerve deficit. She exhibits normal muscle tone. Coordination normal.  No ataxia on finger to nose bilaterally. No pronator drift. 5/5 strength throughout. CN 2-12 intact. Equal grip strength. Sensation intact.  Skin: Skin is warm.  Psychiatric: She has a normal mood and affect. Her behavior is normal.  Nursing note and vitals reviewed.   ED Course  Procedures (including critical care time) Labs Review Labs Reviewed  BASIC METABOLIC PANEL - Abnormal; Notable for the following:    Glucose, Bld 132 (*)    Creatinine, Ser 1.21 (*)    GFR calc non Af Amer 38 (*)    GFR calc Af Amer 44 (*)    All other components within normal limits  PROTIME-INR - Abnormal; Notable for the following:    Prothrombin Time 25.8 (*)    INR 2.33 (*)    All other components within normal limits  CBC WITH DIFFERENTIAL/PLATELET  TROPONIN I  TROPONIN I  TROPONIN I  TSH  COMPREHENSIVE METABOLIC PANEL  CBC  PROTIME-INR    Imaging Review Ct Head Wo Contrast  01/28/2015   CLINICAL DATA:  Fall. Patient anticoagulated.  Posterior scalp hematoma.  EXAM: CT HEAD WITHOUT CONTRAST  TECHNIQUE: Contiguous axial images were obtained from the base of the skull through the vertex without intravenous contrast.  COMPARISON:  06/26/2013 MR. 06/25/2013 CT head.  FINDINGS: No evidence for acute infarction, hemorrhage, mass lesion, hydrocephalus, or extra-axial fluid. Mild cerebral and cerebellar atrophy not unexpected for the patient's age of 59. Mild white matter disease.  Calvarium is intact. There is no contrecoup injury. No visible subarachnoid blood. No sinus or mastoid air fluid level.  Large RIGHT parietal scalp hematoma.  IMPRESSION: Large RIGHT frontal scalp hematoma. No underlying skull fracture or intracranial hemorrhage.   Electronically Signed   By: Rolla Flatten M.D.   On: 01/28/2015 13:00   Dg Chest Portable 1 View  01/28/2015   CLINICAL DATA:  [place] today after experiencing dizziness.  Hit head.  EXAM: PORTABLE CHEST - 1 VIEW  COMPARISON:  01/03/2014.  FINDINGS: Cardiomegaly. Aortic atherosclerosis. No infiltrates or failure. No acute osseous findings. Chronic LEFT rib fractures.  IMPRESSION: Cardiomegaly.  No acute findings.   Electronically Signed   By: Rolla Flatten M.D.   On: 01/28/2015 12:48     EKG Interpretation   Date/Time:  Tuesday Jan 28 2015 11:56:35 EDT Ventricular Rate:  57 PR Interval:  144 QRS Duration: 122 QT Interval:  450 QTC Calculation: 438 R Axis:   -62 Text Interpretation:  Junctional rhythm RBBB and LAFB inferior lateral T  wave inversions Confirmed by Wyvonnia Dusky  MD, Bonna Steury [phone]) on 01/28/2015  12:04:02 PM      MDM   Final diagnoses:  Symptomatic bradycardia  LOC (loss of consciousness)   Near-syncope with an irregular bradycardia, no chest pain or shortness of breath.   Slow Afib in the ED, mental status and BP stable.   CT head obtained due to coumadin use.  No C spine pain.  CT head negative. EKG with diffuse T wave inversions more pronounced than previous. No CP or  SOB> Troponin negative. Hold beta blocker.  D/w cardiology. Likely needs pacemaker.  They will admit. BP and mental status stable in the ED.  Ezequiel Essex, MD 01/28/15 [phone]

## 2015-01-29 ENCOUNTER — Ambulatory Visit (HOSPITAL_COMMUNITY): Payer: Medicare HMO

## 2015-01-29 DIAGNOSIS — I4891 Unspecified atrial fibrillation: Secondary | ICD-10-CM

## 2015-01-29 LAB — COMPREHENSIVE METABOLIC PANEL
ALBUMIN: 3.5 g/dL (ref 3.5–5.0)
ALT: 15 U/L (ref 14–54)
AST: 21 U/L (ref 15–41)
Alkaline Phosphatase: 32 U/L — ABNORMAL LOW (ref 38–126)
Anion gap: 12 (ref 5–15)
BILIRUBIN TOTAL: 0.6 mg/dL (ref 0.3–1.2)
BUN: 10 mg/dL (ref 6–20)
CALCIUM: 8.5 mg/dL — AB (ref 8.9–10.3)
CHLORIDE: 108 mmol/L (ref 101–111)
CO2: 19 mmol/L — ABNORMAL LOW (ref 22–32)
Creatinine, Ser: 1.06 mg/dL — ABNORMAL HIGH (ref 0.44–1.00)
GFR calc Af Amer: 52 mL/min — ABNORMAL LOW (ref >60)
GFR calc non Af Amer: 45 mL/min — ABNORMAL LOW (ref >60)
Glucose, Bld: 98 mg/dL (ref 70–99)
Potassium: 3.8 mmol/L (ref 3.5–5.1)
Sodium: 139 mmol/L (ref 135–145)
Total Protein: 6 g/dL — ABNORMAL LOW (ref 6.5–8.1)

## 2015-01-29 LAB — CBC
HCT: 32.9 % — ABNORMAL LOW (ref 36.0–46.0)
HEMOGLOBIN: 11.2 g/dL — AB (ref 12.0–15.0)
MCH: 30.5 pg (ref 26.0–34.0)
MCHC: 34 g/dL (ref 30.0–36.0)
MCV: 89.6 fL (ref 78.0–100.0)
Platelets: 155 10*3/uL (ref 150–400)
RBC: 3.67 MIL/uL — AB (ref 3.87–5.11)
RDW: 12.9 % (ref 11.5–15.5)
WBC: 6.3 10*3/uL (ref 4.0–10.5)

## 2015-01-29 LAB — TROPONIN I: TROPONIN I: 0.03 ng/mL (ref <0.031)

## 2015-01-29 LAB — APTT: APTT: 42 s — AB (ref 24–37)

## 2015-01-29 LAB — PROTIME-INR
INR: 2.4 — AB (ref 0.00–1.49)
Prothrombin Time: 26.4 seconds — ABNORMAL HIGH (ref 11.6–15.2)

## 2015-01-29 NOTE — Discharge Instructions (Signed)
Bradycardia °Bradycardia is a term for a heart rate (pulse) that, in adults, is slower than 60 beats per minute. A normal rate is 60 to 100 beats per minute. A heart rate below 60 beats per minute may be normal for some adults with healthy hearts. If the rate is too slow, the heart may have trouble pumping the volume of blood the body needs. If the heart rate gets too low, blood flow to the brain may be decreased and may make you feel lightheaded, dizzy, or faint. °The heart has a natural pacemaker in the top of the heart called the SA node (sinoatrial or sinus node). This pacemaker sends out regular electrical signals to the muscle of the heart, telling the heart muscle when to beat (contract). The electrical signal travels from the upper parts of the heart (atria) through the AV node (atrioventricular node), to the lower chambers of the heart (ventricles). The ventricles squeeze, pumping the blood from your heart to your lungs and to the rest of your body. °CAUSES  °· Problem with the heart's electrical system. °· Problem with the heart's natural pacemaker. °· Heart disease, damage, or infection. °· Medications. °· Problems with minerals and salts (electrolytes). °SYMPTOMS  °· Fainting (syncope). °· Fatigue and weakness. °· Shortness of breath (dyspnea). °· Chest pain (angina). °· Drowsiness. °· Confusion. °DIAGNOSIS  °· An electrocardiogram (ECG) can help your caregiver determine the type of slow heart rate you have. °· If the cause is not seen on an ECG, you may need to wear a heart monitor that records your heart rhythm for several hours or days. °· Blood tests. °TREATMENT  °· Electrolyte supplements. °· Medications. °· Withholding medication which is causing a slow heart rate. °· Pacemaker placement. °SEEK IMMEDIATE MEDICAL CARE IF:  °· You feel lightheaded or faint. °· You develop an irregular heart rate. °· You feel chest pain or have trouble breathing. °MAKE SURE YOU:  °· Understand these  instructions. °· Will watch your condition. °· Will get help right away if you are not doing well or get worse. °Document Released: 06/05/2002 Document Revised: 12/06/2011 Document Reviewed: 12/19/2013 °[employer]® Patient Information ©2015 [employer]. This information is not intended to replace advice given to you by your health care provider. Make sure you discuss any questions you have with your health care provider. ° °

## 2015-01-29 NOTE — Progress Notes (Signed)
Went over all d/c instructions with pt and daughter Pt is HOH so daughter is aware of all meds. Given written schedule of what & when to take. Dressed and ready for d/c Pt and daughter are aware that they will get a phone call from Cardiology to place holter monitor. If no call by 0900 5/4 to call office to check on time for appointment.

## 2015-01-29 NOTE — Progress Notes (Signed)
Text page to cardiology EP made aware that pt really does not want to have a pacemaker. Daughter is open to talking to cardiology. Pt remains NPO in case of procedure today .

## 2015-01-29 NOTE — Progress Notes (Signed)
ANTICOAGULATION CONSULT NOTE - Follow Up Consult  Pharmacy Consult for coumadin Indication: atrial fibrillation  No Known Allergies  Patient Measurements: Height: 5\' 3"  (160 cm) Weight: 144 lb 13.5 oz (65.7 kg) IBW/kg (Calculated) : 52.4  Vital Signs: Temp: 98.2 F (36.8 C) (05/04 0800) Temp Source: Oral (05/04 0800) BP: 150/48 mmHg (05/04 0800) Pulse Rate: 58 (05/04 0800)  Labs:  Recent Labs  01/28/15 1215 01/28/15 1924 01/29/15 0002 01/29/15 0311  HGB 12.5  --   --  11.2*  HCT 36.3  --   --  32.9*  PLT 208  --   --  155  APTT  --   --   --  42*  LABPROT 25.8*  --   --  26.4*  INR 2.33*  --   --  2.40*  CREATININE 1.21* 1.20*  --  1.06*  TROPONINI <0.03 0.03 0.03  --     Estimated Creatinine Clearance: 32.1 mL/min (by C-G formula based on Cr of 1.06).   Medications:  Scheduled:  . sodium chloride  3 mL Intravenous Q12H  . warfarin  5 mg Oral Once per day on Sun Mon Wed Thu Fri  . warfarin  7.5 mg Oral Once per day on Tue Sat  . Warfarin - Pharmacist Dosing Inpatient   Does not apply q1800    Assessment: 77 YOF with Afib on Coumadin presents to the ED with bradycardia and feeling lightheaded. Pharmacy consulted to dose coumadin. INR today = 2.4. Patient noted with scalp hematoma and plans noted for monitoring for now with expectation of resolution.   Goal of Therapy:  INR 2-3 Monitor platelets by anticoagulation protocol: Yes   Plan:   -Continue coumadin at home dose -Daily PT/INR for now  Hildred Laser, Pharm D 01/29/2015 11:49 AM

## 2015-01-29 NOTE — Progress Notes (Signed)
Pt discharged per w/c with all belongings. Accomapnied by nurse tech. Daughter took pt home via private car will stay with her tonight and limit pt's activity tonight until seen for holter monitor in cardiology office in am.

## 2015-01-29 NOTE — Discharge Summary (Signed)
Discharge Summary   Patient ID: Erica Carroll MRN: [id number], DOB/AGE: [birth year] 81 y.o. Admit date: 01/28/2015 D/C date:     01/29/2015  Primary Cardiologist: Dr. Stanford Breed  Principal Problem:   Symptomatic bradycardia Active Problems:   TIA (transient ischemic attack)   Paroxysmal atrial fibrillation   GERD (gastroesophageal reflux disease)   Long term current use of anticoagulant therapy   Vitamin D deficiency   Prediabetes   Acute kidney injury   Scalp hematoma   Bradycardia   Admission Dates: 01/28/15-01/29/15 Discharge Diagnosis: tachy-brady syndrome with symptomatic bradycardia s/p discontinuation of her BB and outpatient event monitor.   HPI: Erica Carroll is a 81 y.o. female with a history of atrial fibrillation on Coumadin and pindalol, history of sinus bradycardia while on diltiazem, TIA, prediabetes and GERD who presented to [facility] on 01/28/15 with dizziness/fall and found to have afib with slow ventricular response.    She has been followed by Dr. Stanford Breed. She was first diagnosed with atrial fibrillation back in October 2014. At the time, she was admitted with TIA after 20 minutes of aphasia and troubles speaking or comprehending speech. She had atrial fibrillation in the ambulance with heart rate in the 130s however resolved prior to hospital arrival. Echocardiogram obtained in 2014 showed EF 55-60%. She was initially discharged on Plavix. She was readmitted 2 weeks later with dizziness and found to have atrial fibrillation with RVR. She was started on diltiazem which caused bradycardia. She was also started on Coumadin given CHA2DS2-Vasc score of 5. She was eventually transitioned to pindolol 2.5 mg twice a day to avoid bradycardia.  She has been doing well since. She does endorse having episode of mechanical fall over a year and half ago, however she has not had any falls since then. She gets around with a walker. She also appears to be quite active and able to take care of  her daily activity without problem.  She was cleaning out the bottom of her fridge in the morning of 01/28/2015. She had to bend over during the process, however as she began to rise she had significant dizziness and subsequently fell onto the floor. She hit the right side of her head on the fridge door during the event. She called her daughter who contacted EMS. She denies any chest pain during the event. On EMS arrival, she was in atrial fibrillation with heart rate in the 30s to 50s. She was subsequently transferred to [facility] for further evaluation.   While in the hospital, she has been going in and out of atrial fibrillation. While in atrial fibrillation, she appears to have heart rate in the 30s to 50s, however while in sinus rhythm his heart rate has been maintaining 60s. He was admitted by general cardiology service and pindolol has been discontinued. Her creatinine was mildly elevated concerning for dehydration and she is received fluid in the ED. When the concern of symptomatic bradycardia, EP was consulted for consideration of potential pacemaker.   Hospital Course   Tachybrady syndrome- Telemetry today demonstrates sinus pauses as well as slow ventricular response and atrial fibrillation. She is feeling much better off Pindolol.  -- The patient has a history of tachybradycardia syndrome with atrial fibrillation rates documented greater than 150. She has however over the last 18 months on low-dose pindolol had no symptoms. Her episode of syncope was associated with bending. She was found to be in slow atrial fibrillation. The causal relationship is not clear. The patient  is reluctant to undergo pacing. We have elected to allow her to be discharged. She will be discharged without ongoing pindolol therapy. She'll be given a 30 day event recorder to look for a burden of bradycardia and atrial fibrillation. She will continue to consider the possibility of backup bradycardia pacing for  tachybradycardia syndrome. This was reviewed extensively with her and her daughter.  -- Continue coumadin.   Dehydration with mildly elevated Cr 1.2- improved after fluids. Discharge creat 1.06.   H/o TIA- continue coumadin    The patient has had an uncomplicated hospital course and is recovering well. She has been seen by Dr. [name] today and deemed ready for discharge home. All follow-up appointments have been scheduled. Discharge medications are listed below.   Discharge Vitals: Blood pressure 150/55, pulse 69, temperature 98.1 F (36.7 C), temperature source Oral, resp. rate 15, height 5\' 3"  (1.6 m), weight 144 lb 13.5 oz (65.7 kg), SpO2 100 %.  Labs: Lab Results  Component Value Date   WBC 6.3 01/29/2015   HGB 11.2* 01/29/2015   HCT 32.9* 01/29/2015   MCV 89.6 01/29/2015   PLT 155 01/29/2015     Recent Labs Lab 01/29/15 0311  NA 139  K 3.8  CL 108  CO2 19*  BUN 10  CREATININE 1.06*  CALCIUM 8.5*  PROT 6.0*  BILITOT 0.6  ALKPHOS 32*  ALT 15  AST 21  GLUCOSE 98    Recent Labs  01/28/15 1215 01/28/15 1924 01/29/15 0002  TROPONINI <0.03 0.03 0.03   Lab Results  Component Value Date   CHOL 136 01/07/2015   HDL 47 01/07/2015   LDLCALC 58 01/07/2015   TRIG 154* 01/07/2015     Diagnostic Studies/Procedures   Ct Head Wo Contrast  01/28/2015   CLINICAL DATA:  Fall. Patient anticoagulated. Posterior scalp hematoma.  EXAM: CT HEAD WITHOUT CONTRAST  TECHNIQUE: Contiguous axial images were obtained from the base of the skull through the vertex without intravenous contrast.  COMPARISON:  06/26/2013 MR. 06/25/2013 CT head.  FINDINGS: No evidence for acute infarction, hemorrhage, mass lesion, hydrocephalus, or extra-axial fluid. Mild cerebral and cerebellar atrophy not unexpected for the patient's age of 69. Mild white matter disease.  Calvarium is intact. There is no contrecoup injury. No visible subarachnoid blood. No sinus or mastoid air fluid level.  Large RIGHT  parietal scalp hematoma.  IMPRESSION: Large RIGHT frontal scalp hematoma. No underlying skull fracture or intracranial hemorrhage.   Electronically Signed   By: Rolla Flatten M.D.   On: 01/28/2015 13:00   Dg Chest Portable 1 View  01/28/2015   CLINICAL DATA:  [place] today after experiencing dizziness.  Hit head.  EXAM: PORTABLE CHEST - 1 VIEW  COMPARISON:  01/03/2014.  FINDINGS: Cardiomegaly. Aortic atherosclerosis. No infiltrates or failure. No acute osseous findings. Chronic LEFT rib fractures.  IMPRESSION: Cardiomegaly.  No acute findings.   Electronically Signed   By: Rolla Flatten M.D.   On: 01/28/2015 12:48    Discharge Medications     Medication List    STOP taking these medications        pindolol 5 MG tablet  Commonly known as:  VISKEN      TAKE these medications        acetaminophen 325 MG tablet  Commonly known as:  TYLENOL  Take 325 mg by mouth every 6 (six) hours as needed for mild pain.     GLUCOSAMINE-CHONDROITIN PO  Take 1 tablet by mouth daily.  MAGNESIUM OXIDE (ANTACID) PO  Take 1 tablet by mouth daily.     OVER THE COUNTER MEDICATION  Preserve vision vitamin     vitamin B-12 100 MCG tablet  Commonly known as:  CYANOCOBALAMIN  Take 50 mcg by mouth daily.     VITAMIN D PO  Take 4,000 Units by mouth daily.     warfarin 5 MG tablet  Commonly known as:  COUMADIN  Take As Directed by Coumadin Clinic        Disposition   The patient will be discharged in stable condition to home.  Follow-up Information    Follow up with Virl Axe, MD On 02/20/2015.   Specialty:  Cardiology   Why:  @ 12:15 pm   Contact information:   [address] [address] [phone]       Follow up with Virl Axe, MD.   Specialty:  Cardiology   Why:  The office will call you to arrange a heart monitor. , If you do not hear from them, please contact them., You should be seen within 1-2 weeks.   Contact information:   [address] [address] [phone]         Duration of Discharge Encounter: Greater than 30 minutes including physician and PA time.  Mable Fill R PA-C 01/29/2015, 3:42 PM

## 2015-01-30 ENCOUNTER — Telehealth: Payer: Self-pay | Admitting: Cardiology

## 2015-01-30 NOTE — Telephone Encounter (Signed)
Pt was released from the hospital yesterday. She would like for him to look at her hospital records from this admission. She wants the nurse to call today if possible,need their advice.

## 2015-01-30 NOTE — Progress Notes (Signed)
Utilization Review Completed.  

## 2015-01-31 ENCOUNTER — Telehealth: Payer: Self-pay | Admitting: Cardiology

## 2015-01-31 NOTE — Telephone Encounter (Signed)
Pt's daughter had some concerns about her mother having a Psychologist, forensic, which was strongly suggested by Dr. [name]. She would just like to speak with Hilda Blades or Dr. Stanford Breed before moving forward with this. Please f/u with pt's daughter  Thanks

## 2015-01-31 NOTE — Telephone Encounter (Signed)
Message sent to Dr. Claiborne Billings

## 2015-01-31 NOTE — Telephone Encounter (Signed)
Pt. Coming in on Monday and we will try and see if Erica Carroll can talk with her

## 2015-02-03 ENCOUNTER — Other Ambulatory Visit: Payer: Self-pay

## 2015-02-03 ENCOUNTER — Encounter (INDEPENDENT_AMBULATORY_CARE_PROVIDER_SITE_OTHER): Payer: Medicare HMO

## 2015-02-03 DIAGNOSIS — I4891 Unspecified atrial fibrillation: Secondary | ICD-10-CM

## 2015-02-03 DIAGNOSIS — R55 Syncope and collapse: Secondary | ICD-10-CM

## 2015-02-03 DIAGNOSIS — R001 Bradycardia, unspecified: Secondary | ICD-10-CM

## 2015-02-03 NOTE — Progress Notes (Unsigned)
Pt comes in post-hospitalization to investigate further on cause of syncope/collapse event.

## 2015-02-03 NOTE — Patient Instructions (Signed)
Cardiac Event Monitoring A cardiac event monitor is a small recording device used to help detect abnormal heart rhythms (arrhythmias). The monitor is used to record heart rhythm when noticeable symptoms such as the following occur:  Fast heartbeats (palpitations), such as heart racing or fluttering.  Dizziness.  Fainting or light-headedness.  Unexplained weakness. The monitor is wired to two electrodes placed on your chest. Electrodes are flat, sticky disks that attach to your skin. The monitor can be worn for up to 30 days. You will wear the monitor at all times, except when bathing.  HOW TO USE YOUR CARDIAC EVENT MONITOR A technician will prepare your chest for the electrode placement. The technician will show you how to place the electrodes, how to work the monitor, and how to replace the batteries. Take time to practice using the monitor before you leave the office. Make sure you understand how to send the information from the monitor to your health care provider. This requires a telephone with a landline, not a cell phone. You need to:  Wear your monitor at all times, except when you are in water:  Do not get the monitor wet.  Take the monitor off when bathing. Do not swim or use a hot tub with it on.  Keep your skin clean. Do not put body lotion or moisturizer on your chest.  Change the electrodes daily or any time they stop sticking to your skin. You might need to use tape to keep them on.  It is possible that your skin under the electrodes could become irritated. To keep this from happening, try to put the electrodes in slightly different places on your chest. However, they must remain in the area under your left breast and in the upper right section of your chest.  Make sure the monitor is safely clipped to your clothing or in a location close to your body that your health care provider recommends.  Press the button to record when you feel symptoms of heart trouble, such as  dizziness, weakness, light-headedness, palpitations, thumping, shortness of breath, unexplained weakness, or a fluttering or racing heart. The monitor is always on and records what happened slightly before you pressed the button, so do not worry about being too late to get good information.  Keep a diary of your activities, such as walking, doing chores, and taking medicine. It is especially important to note what you were doing when you pushed the button to record your symptoms. This will help your health care provider determine what might be contributing to your symptoms. The information stored in your monitor will be reviewed by your health care provider alongside your diary entries.  Send the recorded information as recommended by your health care provider. It is important to understand that it will take some time for your health care provider to process the results.  Change the batteries as recommended by your health care provider. SEEK IMMEDIATE MEDICAL CARE IF:   You have chest pain.  You have extreme difficulty breathing or shortness of breath.  You develop a very fast heartbeat that persists.  You develop dizziness that does not go away.  You faint or constantly feel you are about to faint. Document Released: 06/22/2008 Document Revised: 01/28/2014 Document Reviewed: 03/12/2013 Exit[employer] Patient Information 2015 [employer]. This information is not intended to replace advice given to you by your health care provider. Make sure you discuss any questions you have with your health care provider.  

## 2015-02-04 ENCOUNTER — Other Ambulatory Visit: Payer: Self-pay

## 2015-02-04 ENCOUNTER — Telehealth: Payer: Self-pay | Admitting: *Deleted

## 2015-02-04 MED ORDER — TIZANIDINE HCL 4 MG PO CAPS: 4.0000 mg | ORAL_CAPSULE | Freq: Three times a day (TID) | ORAL | Status: DC | PRN

## 2015-02-04 NOTE — Telephone Encounter (Signed)
[facility] called and reported patient's insurance will not cover Tizanidine 4 mg capsules, but will cover the tabs.  OK to change to tabs per Dr Melford Aase.  Change was called to [place].

## 2015-02-07 ENCOUNTER — Telehealth: Payer: Self-pay

## 2015-02-07 ENCOUNTER — Telehealth: Payer: Self-pay | Admitting: *Deleted

## 2015-02-07 NOTE — Telephone Encounter (Signed)
Received paper note from front office staff, Erica Carroll, patients daughter called and advised that patient has not had a bowel movement since, Sunday, she has taken fiber, stool softener, and has increased fluids, per Dr Melford Aase advised patient to try Dulcolax , Fleet mineral oil enema. Advised if symptoms get worse or persist follow up in office for evaluation or take to ER/Urgent care

## 2015-02-07 NOTE — Telephone Encounter (Signed)
Spoke with pt dtr, received an urgent report from [place]. The patient had an episode of atrial fib with a rate of 144 and then a drop to 61 when back in sinus rhythm. Patient is having no problems.

## 2015-02-10 ENCOUNTER — Ambulatory Visit: Payer: Self-pay | Admitting: Internal Medicine

## 2015-02-12 ENCOUNTER — Encounter (HOSPITAL_COMMUNITY)
Admission: EM | Disposition: A | Discharge status: Home / Self Care | Payer: Medicare HMO | Source: Home / Self Care | Attending: Internal Medicine

## 2015-02-12 ENCOUNTER — Encounter (HOSPITAL_COMMUNITY): Payer: Self-pay | Admitting: *Deleted

## 2015-02-12 ENCOUNTER — Emergency Department (HOSPITAL_COMMUNITY): Payer: Medicare HMO

## 2015-02-12 ENCOUNTER — Other Ambulatory Visit: Payer: Self-pay

## 2015-02-12 ENCOUNTER — Telehealth: Payer: Self-pay | Admitting: Cardiology

## 2015-02-12 ENCOUNTER — Inpatient Hospital Stay (HOSPITAL_COMMUNITY)
Admission: EM | Admit: 2015-02-12 | Discharge: 2015-02-13 | DRG: 244 | Disposition: A | Discharge status: Home / Self Care | Payer: Medicare HMO | Attending: Internal Medicine | Admitting: Internal Medicine

## 2015-02-12 DIAGNOSIS — Z8673 Personal history of transient ischemic attack (TIA), and cerebral infarction without residual deficits: Secondary | ICD-10-CM

## 2015-02-12 DIAGNOSIS — I480 Paroxysmal atrial fibrillation: Secondary | ICD-10-CM | POA: Diagnosis present

## 2015-02-12 DIAGNOSIS — I495 Sick sinus syndrome: Secondary | ICD-10-CM | POA: Diagnosis present

## 2015-02-12 DIAGNOSIS — Z79899 Other long term (current) drug therapy: Secondary | ICD-10-CM | POA: Diagnosis not present

## 2015-02-12 DIAGNOSIS — Z8249 Family history of ischemic heart disease and other diseases of the circulatory system: Secondary | ICD-10-CM | POA: Diagnosis not present

## 2015-02-12 DIAGNOSIS — E785 Hyperlipidemia, unspecified: Secondary | ICD-10-CM | POA: Diagnosis present

## 2015-02-12 DIAGNOSIS — Z7901 Long term (current) use of anticoagulants: Secondary | ICD-10-CM

## 2015-02-12 DIAGNOSIS — E559 Vitamin D deficiency, unspecified: Secondary | ICD-10-CM | POA: Diagnosis present

## 2015-02-12 DIAGNOSIS — Z959 Presence of cardiac and vascular implant and graft, unspecified: Secondary | ICD-10-CM

## 2015-02-12 DIAGNOSIS — K219 Gastro-esophageal reflux disease without esophagitis: Secondary | ICD-10-CM | POA: Diagnosis present

## 2015-02-12 DIAGNOSIS — I4891 Unspecified atrial fibrillation: Secondary | ICD-10-CM

## 2015-02-12 DIAGNOSIS — R001 Bradycardia, unspecified: Secondary | ICD-10-CM

## 2015-02-12 HISTORY — DX: Rheumatoid arthritis, unspecified: M06.9

## 2015-02-12 HISTORY — PX: EP IMPLANTABLE DEVICE: SHX172B

## 2015-02-12 LAB — CBC WITH DIFFERENTIAL/PLATELET
Basophils Absolute: 0 10*3/uL (ref 0.0–0.1)
Basophils Relative: 0 % (ref 0–1)
Eosinophils Absolute: 0.3 10*3/uL (ref 0.0–0.7)
Eosinophils Relative: 4 % (ref 0–5)
HEMATOCRIT: 38.6 % (ref 36.0–46.0)
Hemoglobin: 13.6 g/dL (ref 12.0–15.0)
LYMPHS ABS: 2 10*3/uL (ref 0.7–4.0)
Lymphocytes Relative: 28 % (ref 12–46)
MCH: 30.4 pg (ref 26.0–34.0)
MCHC: 35.2 g/dL (ref 30.0–36.0)
MCV: 86.2 fL (ref 78.0–100.0)
MONO ABS: 0.7 10*3/uL (ref 0.1–1.0)
MONOS PCT: 9 % (ref 3–12)
Neutro Abs: 4.4 10*3/uL (ref 1.7–7.7)
Neutrophils Relative %: 59 % (ref 43–77)
Platelets: 196 10*3/uL (ref 150–400)
RBC: 4.48 MIL/uL (ref 3.87–5.11)
RDW: 12.6 % (ref 11.5–15.5)
WBC: 7.3 10*3/uL (ref 4.0–10.5)

## 2015-02-12 LAB — BASIC METABOLIC PANEL
ANION GAP: 10 (ref 5–15)
BUN: 11 mg/dL (ref 6–20)
CALCIUM: 9 mg/dL (ref 8.9–10.3)
CO2: 21 mmol/L — ABNORMAL LOW (ref 22–32)
Chloride: 101 mmol/L (ref 101–111)
Creatinine, Ser: 1.15 mg/dL — ABNORMAL HIGH (ref 0.44–1.00)
GFR calc Af Amer: 47 mL/min — ABNORMAL LOW (ref >60)
GFR calc non Af Amer: 41 mL/min — ABNORMAL LOW (ref >60)
GLUCOSE: 131 mg/dL — AB (ref 65–99)
Potassium: 3.9 mmol/L (ref 3.5–5.1)
Sodium: 132 mmol/L — ABNORMAL LOW (ref 135–145)

## 2015-02-12 LAB — BRAIN NATRIURETIC PEPTIDE: B NATRIURETIC PEPTIDE 5: 267.3 pg/mL — AB (ref 0.0–100.0)

## 2015-02-12 LAB — I-STAT TROPONIN, ED
TROPONIN I, POC: 0 ng/mL (ref 0.00–0.08)
Troponin i, poc: 0.02 ng/mL (ref 0.00–0.08)

## 2015-02-12 LAB — TROPONIN I

## 2015-02-12 LAB — PROTIME-INR
INR: 2.99 — AB (ref 0.00–1.49)
Prothrombin Time: 31.3 seconds — ABNORMAL HIGH (ref 11.6–15.2)

## 2015-02-12 SURGERY — PACEMAKER IMPLANT

## 2015-02-12 MED ORDER — HEPARIN (PORCINE) IN NACL 2-0.9 UNIT/ML-% IJ SOLN
INTRAMUSCULAR | Status: AC
  Filled 2015-02-12: qty 500

## 2015-02-12 MED ORDER — DEXTROSE 5 % IV SOLN
2.0000 g | INTRAVENOUS | Status: DC | PRN
  Administered 2015-02-12: 2 g via INTRAVENOUS

## 2015-02-12 MED ORDER — METOPROLOL TARTRATE 25 MG PO TABS
25.0000 mg | ORAL_TABLET | Freq: Three times a day (TID) | ORAL | Status: DC
  Administered 2015-02-12 – 2015-02-13 (×3): 25 mg via ORAL
  Filled 2015-02-12 (×3): qty 1

## 2015-02-12 MED ORDER — FENTANYL CITRATE (PF) 100 MCG/2ML IJ SOLN
INTRAMUSCULAR | Status: DC | PRN
  Administered 2015-02-12: 25 ug via INTRAVENOUS

## 2015-02-12 MED ORDER — CHLORHEXIDINE GLUCONATE 4 % EX LIQD
60.0000 mL | Freq: Once | CUTANEOUS | Status: DC
  Filled 2015-02-12: qty 60

## 2015-02-12 MED ORDER — CEFAZOLIN SODIUM-DEXTROSE 2-3 GM-% IV SOLR
INTRAVENOUS | Status: AC
  Filled 2015-02-12: qty 50

## 2015-02-12 MED ORDER — ACETAMINOPHEN 325 MG PO TABS: 325.0000 mg | ORAL_TABLET | Freq: Four times a day (QID) | ORAL | Status: DC | PRN

## 2015-02-12 MED ORDER — MIDAZOLAM HCL 5 MG/5ML IJ SOLN
INTRAMUSCULAR | Status: AC
  Filled 2015-02-12: qty 5

## 2015-02-12 MED ORDER — CHLORHEXIDINE GLUCONATE 4 % EX LIQD
60.0000 mL | Freq: Once | CUTANEOUS | Status: AC
  Administered 2015-02-12: 4 via TOPICAL

## 2015-02-12 MED ORDER — LIDOCAINE HCL (PF) 1 % IJ SOLN
INTRAMUSCULAR | Status: AC
  Filled 2015-02-12: qty 60

## 2015-02-12 MED ORDER — SODIUM CHLORIDE 0.9 % IR SOLN
80.0000 mg | Status: DC
  Filled 2015-02-12 (×2): qty 2

## 2015-02-12 MED ORDER — FENTANYL CITRATE (PF) 100 MCG/2ML IJ SOLN
INTRAMUSCULAR | Status: AC
  Filled 2015-02-12: qty 2

## 2015-02-12 MED ORDER — SODIUM CHLORIDE 0.9 % IJ SOLN: 3.0000 mL | INTRAMUSCULAR | Status: DC | PRN

## 2015-02-12 MED ORDER — SODIUM CHLORIDE 0.9 % IV SOLN
INTRAVENOUS | Status: AC
  Administered 2015-02-12: 18:00:00 via INTRAVENOUS

## 2015-02-12 MED ORDER — SODIUM CHLORIDE 0.9 % IV SOLN: 250.0000 mL | INTRAVENOUS | Status: DC | PRN

## 2015-02-12 MED ORDER — ONDANSETRON HCL 4 MG/2ML IJ SOLN: 4.0000 mg | Freq: Four times a day (QID) | INTRAMUSCULAR | Status: DC | PRN

## 2015-02-12 MED ORDER — SODIUM CHLORIDE 0.9 % IV SOLN
INTRAVENOUS | Status: DC
  Administered 2015-02-12: 15:00:00 via INTRAVENOUS

## 2015-02-12 MED ORDER — TIZANIDINE HCL 4 MG PO TABS
4.0000 mg | ORAL_TABLET | Freq: Three times a day (TID) | ORAL | Status: DC | PRN
  Filled 2015-02-12: qty 1

## 2015-02-12 MED ORDER — DILTIAZEM HCL 25 MG/5ML IV SOLN
5.0000 mg | Freq: Once | INTRAVENOUS | Status: AC
  Administered 2015-02-12: 5 mg via INTRAVENOUS
  Filled 2015-02-12: qty 5

## 2015-02-12 MED ORDER — TIZANIDINE HCL 4 MG PO CAPS
4.0000 mg | ORAL_CAPSULE | Freq: Three times a day (TID) | ORAL | Status: DC | PRN
Start: 2015-02-12 — End: 2015-02-12

## 2015-02-12 MED ORDER — DILTIAZEM HCL 100 MG IV SOLR
5.0000 mg/h | Freq: Once | INTRAVENOUS | Status: AC
  Administered 2015-02-12: 5 mg/h via INTRAVENOUS

## 2015-02-12 MED ORDER — MIDAZOLAM HCL 5 MG/5ML IJ SOLN
INTRAMUSCULAR | Status: DC | PRN
  Administered 2015-02-12 (×2): 1 mg via INTRAVENOUS

## 2015-02-12 MED ORDER — POLYETHYLENE GLYCOL 3350 17 G PO PACK: 17.0000 g | PACK | Freq: Every day | ORAL | Status: DC | PRN

## 2015-02-12 MED ORDER — CEFAZOLIN SODIUM 1-5 GM-% IV SOLN
1.0000 g | Freq: Four times a day (QID) | INTRAVENOUS | Status: AC
  Administered 2015-02-12 – 2015-02-13 (×3): 1 g via INTRAVENOUS
  Filled 2015-02-12 (×3): qty 50

## 2015-02-12 MED ORDER — ACETAMINOPHEN 325 MG PO TABS
325.0000 mg | ORAL_TABLET | ORAL | Status: DC | PRN
  Administered 2015-02-12 – 2015-02-13 (×2): 650 mg via ORAL
  Filled 2015-02-12 (×2): qty 2

## 2015-02-12 MED ORDER — CEFAZOLIN SODIUM-DEXTROSE 2-3 GM-% IV SOLR
2.0000 g | INTRAVENOUS | Status: DC
  Filled 2015-02-12: qty 50

## 2015-02-12 MED ORDER — SODIUM CHLORIDE 0.9 % IJ SOLN
3.0000 mL | Freq: Two times a day (BID) | INTRAMUSCULAR | Status: DC
  Administered 2015-02-12: 3 mL via INTRAVENOUS

## 2015-02-12 MED ORDER — SODIUM CHLORIDE 0.9 % IV SOLN: INTRAVENOUS | Status: DC

## 2015-02-12 SURGICAL SUPPLY — 8 items
CABLE SURGICAL S-101-97-12 (CABLE) ×2 IMPLANT
HEMOSTAT SURGICEL 2X4 FIBR (HEMOSTASIS) ×1 IMPLANT
LEAD ISOFLEX OPT 1944-46CM (Lead) ×1 IMPLANT
LEAD ISOFLEX OPT 1948-52CM (Lead) ×1 IMPLANT
PAD DEFIB LIFELINK (PAD) ×2 IMPLANT
PPM ASSURITY DR PM2240 (Pacemaker) ×1 IMPLANT
SHEATH CLASSIC 7F (SHEATH) ×2 IMPLANT
TRAY PACEMAKER INSERTION (CUSTOM PROCEDURE TRAY) ×2 IMPLANT

## 2015-02-12 NOTE — Progress Notes (Signed)
We discussed the physiology of atrial fibrillation and the relationship of both rate and the loss of atrial contractility to impaired cardiac performance. We discussed the strategies of rate control and rhythm control with the potential of pro arrhythmia. We reviewed side effect  profiles of rate controlling drugs, including their idiosyncratic nature as well as the potential for them to reduce blood pressure.  For now we'll begin on Lopressor. We will discharge her with hand written prescriptions for metoprolol succinate as well as atenolol and verapamil.  We discussed the potential role of amiodarone as well as a rate and rhythm controlling agent but will defer its initiation for now.

## 2015-02-12 NOTE — ED Provider Notes (Signed)
CSN: [id number]     Arrival date & time 02/12/15  0315 History  This chart was scribed for Erica Porter, MD by Pricilla Loveless, ED Scribe. This patient was seen in room B14C/B14C and the patient's care was started at 3:36 AM.     Chief Complaint  Patient presents with  . Chest Pain    The history is provided by the patient. No language interpreter was used.     HPI Comments: Erica Carroll is a 81 y.o. female with prior Hx of TIA and A-fib, brought in by ambulance who presents to the Emergency Department complaining of upper left chest pain radiating to the midline beginning 2 hours ago that woke her from sleep. She lists SOB as an associated symptom, and states that she felt her heart racing when she placed her hand over the area. The patient states that the medication she was given by the ambulance service has completly eliminated the chest pain at the moment. She was also  seen previously approximately 2 weeks ago on 01/29/15 for a low heart rate in the ED. patient was admitted to the hospital when she had a heart rate down to 35. Her beta blocker was stopped and she is wearing a CardioNet monitor at home. She has an appointment in 2 days with her Dr [name] to discuss whether she needs to have a pacemaker inserted. The patient denies diaphoresis and nausea as associated symptoms. She does not smoke, and drinks lightly.    PCP: Alesia Richards, MD  Cardiology Dr Stanford Breed  Past Medical History  Diagnosis Date  . TIA (transient ischemic attack)     a. with fall 05/2013 -> neg head CT and MRI/MRA;  b. Plavix started.  . A-fib     a. Dx 06/2013;  b. 06/2013 Echo:  EF 55-60%, no reg wma, mild AI, mod dil LA.  . Sinus bradycardia 07/11/2013  . Hyperlipidemia   . Vitamin D deficiency   . Allergy   . GERD (gastroesophageal reflux disease)   . Urinary incontinence    Past Surgical History  Procedure Laterality Date  . Eye surgery Right 1994    CE/IOL  . Eye surgery Left 1998    CE/IOL  .  Appendectomy  1936   Family History  Problem Relation Age of Onset  . Heart disease Mother   . Heart disease Sister   . Heart disease Brother   . Heart disease Brother   . CVA Sister   . Thyroid disease Sister    History  Substance Use Topics  . Smoking status: Never Smoker   . Smokeless tobacco: Never Used  . Alcohol Use: 0.0 oz/week    0 Standard drinks or equivalent per week     Comment: ocassional-rarely   Lives at home Lives with son   OB History    No data available     Review of Systems  Constitutional: Negative for diaphoresis.  Respiratory: Positive for shortness of breath.   Cardiovascular: Positive for chest pain.  Gastrointestinal: Negative for nausea.  All other systems reviewed and are negative.     Allergies  Review of patient's allergies indicates no known allergies.  Home Medications   Prior to Admission medications   Medication Sig Start Date End Date Taking? Authorizing Provider  acetaminophen (TYLENOL) 325 MG tablet Take 325 mg by mouth every 6 (six) hours as needed for mild pain.    Yes Historical Provider, MD  Cholecalciferol (VITAMIN D PO) Take 4,000 Units  by mouth daily.    Yes Historical Provider, MD  GLUCOSAMINE-CHONDROITIN PO Take 1 tablet by mouth daily.   Yes Historical Provider, MD  MAGNESIUM OXIDE, ANTACID, PO Take 1 tablet by mouth daily.   Yes Historical Provider, MD  OVER THE COUNTER MEDICATION Preserve vision vitamin   Yes Historical Provider, MD  polyethylene glycol (MIRALAX / GLYCOLAX) packet Take 17 g by mouth daily as needed (constipation).   Yes Historical Provider, MD  tiZANidine (ZANAFLEX) 4 MG capsule Take 1 capsule (4 mg total) by mouth 3 (three) times daily as needed for muscle spasms. 02/04/15  Yes Unk Pinto, MD  vitamin B-12 (CYANOCOBALAMIN) 100 MCG tablet Take 50 mcg by mouth daily.   Yes Historical Provider, MD  warfarin (COUMADIN) 5 MG tablet Take As Directed by [facility] Patient taking differently: 5  mg. Pt takes 5mg  everyday except for Tues, Sat - pt takes 7.5mg  on Tues, Sat 12/24/14  Yes [name], MD   BP 120/76 mmHg  Pulse 129  Temp(Src) 97.9 F (36.6 C) (Oral)  Resp 20  SpO2 98%  Physical Exam  Constitutional: She is oriented to person, place, and time. She appears well-developed and well-nourished.  Non-toxic appearance. She does not appear ill. No distress.  HENT:  Head: Normocephalic and atraumatic.  Right Ear: External ear normal.  Left Ear: External ear normal.  Nose: Nose normal. No mucosal edema or rhinorrhea.  Mouth/Throat: Oropharynx is clear and moist and mucous membranes are normal. No dental abscesses or uvula swelling.  Eyes: Conjunctivae and EOM are normal. Pupils are equal, round, and reactive to light.  Neck: Normal range of motion and full passive range of motion without pain. Neck supple.  Cardiovascular: Normal heart sounds.  An irregularly irregular rhythm present. Tachycardia present.  Exam reveals no gallop and no friction rub.   No murmur heard. Pulmonary/Chest: Effort normal and breath sounds normal. No respiratory distress. She has no wheezes. She has no rhonchi. She has no rales. She exhibits no tenderness and no crepitus.  Abdominal: Soft. Normal appearance and bowel sounds are normal. She exhibits no distension. There is no tenderness. There is no rebound and no guarding.  Musculoskeletal: Normal range of motion. She exhibits no edema or tenderness.  Moves all extremities well.   Neurological: She is alert and oriented to person, place, and time. She has normal strength. No cranial nerve deficit.  Skin: Skin is warm, dry and intact. No rash noted. No erythema. No pallor.  Psychiatric: She has a normal mood and affect. Her speech is normal and behavior is normal. Her mood appears not anxious.  Nursing note and vitals reviewed.   ED Course  Procedures   Medications  diltiazem (CARDIZEM) injection 5 mg (5 mg Intravenous Given 02/12/15 0526)   diltiazem (CARDIZEM) 100 mg in dextrose 5 % 100 mL (1 mg/mL) infusion (0 mg/hr Intravenous Stopped 02/12/15 0532)    DIAGNOSTIC STUDIES: Oxygen Saturation is 98% on 2L [place], Normal by my interpretation.    COORDINATION OF CARE: 3:36 AM Discussed treatment plan at bedside including IV fluids, DG chest, BNP, and BMP. Pt agreed to plan.   EMS had given patient Cardizem 10 mg bolus twice. They report her initial heart rate was up to 150s. At the time the patient arrived to the ED her heart rate was still in the 130-152 range. She was given 5 mg IV Cardizem bolus and her heart rate acutely dropped to 61 which appeared to be a NSR and later  on down into the high 40s with slow afib or ectopic atrial rhythm.  She denies any chest pain or feeling lightheaded or dizziness. She was not started on a Cardizem drip.  06:49 PA Cecilie Kicks will have Dr Acie Fredrickson call me back.   07:20 Dr Acie Fredrickson will see patient in the ED.   Patient and daughter informs she states is going be seen by a cardiologist.  Labs Review Results for orders placed or performed during the hospital encounter of 33/00/76  Basic metabolic panel  Result Value Ref Range   Sodium 132 (L) 135 - 145 mmol/L   Potassium 3.9 3.5 - 5.1 mmol/L   Chloride 101 101 - 111 mmol/L   CO2 21 (L) 22 - 32 mmol/L   Glucose, Bld 131 (H) 65 - 99 mg/dL   BUN 11 6 - 20 mg/dL   Creatinine, Ser 1.15 (H) 0.44 - 1.00 mg/dL   Calcium 9.0 8.9 - 10.3 mg/dL   GFR calc non Af Amer 41 (L) >60 mL/min   GFR calc Af Amer 47 (L) >60 mL/min   Anion gap 10 5 - 15  BNP (order ONLY if patient complains of dyspnea/SOB AND you have documented it for THIS visit)  Result Value Ref Range   B Natriuretic Peptide 267.3 (H) 0.0 - 100.0 pg/mL  Protime-INR (if pt is taking Coumadin)  Result Value Ref Range   Prothrombin Time 31.3 (H) 11.6 - 15.2 seconds   INR 2.99 (H) 0.00 - 1.49  CBC with Differential  Result Value Ref Range   WBC 7.3 4.0 - 10.5 K/uL   RBC 4.48 3.87 - 5.11  MIL/uL   Hemoglobin 13.6 12.0 - 15.0 g/dL   HCT 38.6 36.0 - 46.0 %   MCV 86.2 78.0 - 100.0 fL   MCH 30.4 26.0 - 34.0 pg   MCHC 35.2 30.0 - 36.0 g/dL   RDW 12.6 11.5 - 15.5 %   Platelets 196 150 - 400 K/uL   Neutrophils Relative % 59 43 - 77 %   Neutro Abs 4.4 1.7 - 7.7 K/uL   Lymphocytes Relative 28 12 - 46 %   Lymphs Abs 2.0 0.7 - 4.0 K/uL   Monocytes Relative 9 3 - 12 %   Monocytes Absolute 0.7 0.1 - 1.0 K/uL   Eosinophils Relative 4 0 - 5 %   Eosinophils Absolute 0.3 0.0 - 0.7 K/uL   Basophils Relative 0 0 - 1 %   Basophils Absolute 0.0 0.0 - 0.1 K/uL  Troponin I  Result Value Ref Range   Troponin I <0.03 <0.031 ng/mL   Laboratory interpretation all normal with therapeutic INR      Imaging Review Dg Chest Port 1 View  02/12/2015   CLINICAL DATA:  Chest pain and shortness of breath.  EXAM: PORTABLE CHEST - 1 VIEW  COMPARISON:  01/28/2015  FINDINGS: Minimally displaced fracture right posterior lateral fifth and sixth rib, not definitively seen on prior exam. No pulmonary complication. Improving lung aeration with decreased cardiomegaly. Pulmonary vasculature is normal. No consolidation, pleural effusion, or pneumothorax. Old posterior left rib fractures.  IMPRESSION: Minimally displaced fractures right posterior lateral fifth and sixth ribs, likely subacute. No pulmonary complication.   Electronically Signed   By: Jeb Levering M.D.   On: 02/12/2015 04:15     EKG Interpretation   Date/Time:  Wednesday Feb 12 2015 03:13:29 EDT Ventricular Rate:  128 PR Interval:  80 QRS Duration: 126 QT Interval:  325 QTC Calculation: 474 R Axis:   -  72 Text Interpretation:  Atrial fibrillation Ventricular premature complex  RBBB and LAFB Since last tracing rate faster (25 Jun 2013) Atrial  fibrillation has replaced Mobitz II 2-degree AV block Confirmed by Kamrin Spath   MD-I, Kayde Warehime ([id number]) on 02/12/2015 6:01:42 AM    #2 right after cardiazem bolus  EKG Interpretation  Date/Time:  Wednesday  Feb 12 2015 05:34:22 EDT Ventricular Rate:  61 PR Interval:  150 QRS Duration: 127 QT Interval:  409 QTC Calculation: 412 R Axis:   -75 Text Interpretation:  Sinus rhythm RBBB and LAFB Since last tracing of earlier today heart rate has decreased and she is now in NSR Confirmed by Jadien Lehigh  MD-I, Marinna Blane ([id number]) on 02/12/2015 6:02:57 AM      # 3    EKG Interpretation  Date/Time:  Wednesday Feb 12 2015 06:30:38 EDT Ventricular Rate:  44 PR Interval:  118 QRS Duration: 129 QT Interval:  491 QTC Calculation: 420 R Axis:   -74 Text Interpretation:  Sinus or ectopic atrial bradycardia Borderline short PR interval RBBB and LAFB Confirmed by Pharrah Rottman  MD-I, Jarquis Walker ([id number]) on 02/12/2015 6:41:42 AM        MDM   Final diagnoses:  Atrial fibrillation with rapid ventricular response  Bradycardia    Plan admission  Erica Porter, MD, FACEP   I personally performed the services described in this documentation, which was scribed in my presence. The recorded information has been reviewed and considered.  Erica Porter, MD, Barbette Or, MD 02/12/15 [phone]

## 2015-02-12 NOTE — ED Notes (Signed)
Patient stated she got a flush feeling in her head but it only lasted for a second.  Heart rate down in the high 50's

## 2015-02-12 NOTE — Telephone Encounter (Signed)
Patient with tachy-brady; forward to Dr [name]; patient in ER now. Kirk Ruths

## 2015-02-12 NOTE — Discharge Summary (Signed)
ELECTROPHYSIOLOGY PROCEDURE DISCHARGE SUMMARY    Patient ID: Erica Carroll,  MRN: [id number], DOB/AGE: Sep 19, 1922 81 y.o.  Admit date: 02/12/2015 Discharge date: 02/13/2015  Primary Care Physician: Alesia Richards, MD Primary Cardiologist: Stanford Breed Electrophysiologist: [name]  Primary Discharge Diagnosis:  Symptomatic tachy/brady syndrome status post pacemaker implantation this admission  Secondary Discharge Diagnosis:  1.  Paroxsymal atrial fibrillation 2.  Prior TIA 3.  GERD 4.  Sinus bradycardia  No Known Allergies   Procedures This Admission:  1.  Implantation of a STJ dual chamber PPM on 02/12/15 by Dr [name].  See op note for full details.  There were no immediate post procedure complications. 2.  CXR on 02/13/15 demonstrated no pneumothorax status post device implantation.   Brief HPI/Hospital Course:  Erica Carroll is a 81 y.o. female with past medical history significant for GERD, prior TIA, sinus bradycardia and atrial fibrillation with RVR. She has been maintained on Pindolol and Warfarin for anticoagulation. She was recently admitted with pre-syncope and pacemaker implantation was recommended for tachy-brady syndrome, however, the patient was reluctant to proceed and was discharged home. She did well initially but awoke the morning of admission with chest pain in the setting of AF with a ventricular rate in the 190's and called EMS. She was transported to [facility] and was given Cardizem with improvement in ventricular rates and subsequent conversion to sinus bradycardia with rates 30-50's.Erica Carroll is a 81 y.o. female with past medical history significant for GERD, prior TIA, sinus bradycardia and atrial fibrillation with RVR. She has been maintained on Pindolol and Warfarin for anticoagulation. She was recently admitted with pre-syncope and pacemaker implantation was recommended for tachy-brady syndrome, however, the patient was reluctant to proceed and was  discharged home. She did well initially but awoke this morning with chest pain in the setting of AF with a ventricular rate in the 190's and called EMS. She was transported to [facility] and was given Cardizem with improvement in ventricular rates and subsequent conversion to sinus bradycardia with rates 30-50's.   The patient underwent implantation of a STJ dual chamber pacemaker with details as outlined above.  She  was monitored on telemetry overnight which demonstrated atrial pacing with intrinsic ventricular conduction and intermittent AF with controlled VR.  Left chest was without hematoma or ecchymosis.  The device was interrogated and found to be functioning normally.  CXR was obtained and demonstrated no pneumothorax status post device implantation.  Wound care, arm mobility, and restrictions were reviewed with the patient.  The patient was examined and considered stable for discharge to home.   No chehst pain or SOB  Has been ambulating without difficulty at home walks with a cane   Physical Exam: Filed Vitals:   02/12/15 2050 02/13/15 0028 02/13/15 0153 02/13/15 0512  BP:   132/66   Pulse:   66   Temp: 98 F (36.7 C) 97.9 F (36.6 C)  97.9 F (36.6 C)  TempSrc: Oral Oral  Oral  Resp:      Weight:    141 lb 4.8 oz (64.093 kg)  SpO2:        GEN- The patient is well appearing, alert and oriented x 3 today.   HEENT: normocephalic, atraumatic; sclera clear, conjunctiva pink; hearing intact; oropharynx clear; neck supple, no JVP Lymph- no cervical lymphadenopathy Lungs- Clear to ausculation bilaterally, normal work of breathing.  No wheezes, rales, rhonchi Heart- Regular rate and rhythm (paced) GI- soft, non-tender, non-distended, bowel sounds present  Extremities- no clubbing, cyanosis, or edema; DP/PT/radial pulses 2+ bilaterally MS- no significant deformity or atrophy Skin- warm and dry, no rash or lesion, left chest without hematoma/ecchymosis Psych- euthymic mood, full  affect Neuro- strength and sensation are intact   Labs:   Lab Results  Component Value Date   WBC 7.3 02/13/2015   HGB 12.2 02/13/2015   HCT 34.8* 02/13/2015   MCV 87.2 02/13/2015   PLT 195 02/13/2015     Recent Labs Lab 02/13/15 0402  NA 135  K 3.9  CL 105  CO2 22  BUN 11  CREATININE 1.04*  CALCIUM 8.5*  GLUCOSE 92    Discharge Medications:    Medication List    TAKE these medications        acetaminophen 325 MG tablet  Commonly known as:  TYLENOL  Take 325 mg by mouth every 6 (six) hours as needed for mild pain.     GLUCOSAMINE-CHONDROITIN PO  Take 1 tablet by mouth daily.     MAGNESIUM OXIDE (ANTACID) PO  Take 1 tablet by mouth daily.     metoprolol tartrate 25 MG tablet  Commonly known as:  LOPRESSOR  Take 1 tablet (25 mg total) by mouth every 8 (eight) hours.     OVER THE COUNTER MEDICATION  Preserve vision vitamin     polyethylene glycol packet  Commonly known as:  MIRALAX / GLYCOLAX  Take 17 g by mouth daily as needed (constipation).     tiZANidine 4 MG capsule  Commonly known as:  ZANAFLEX  Take 1 capsule (4 mg total) by mouth 3 (three) times daily as needed for muscle spasms.     vitamin B-12 100 MCG tablet  Commonly known as:  CYANOCOBALAMIN  Take 50 mcg by mouth daily.     VITAMIN D PO  Take 4,000 Units by mouth daily.     warfarin 5 MG tablet  Commonly known as:  COUMADIN  Take As Directed by [facility]        Disposition:  Discharge Instructions    Diet - low sodium heart healthy    Complete by:  As directed      Increase activity slowly    Complete by:  As directed           Follow-up Information    Follow up with [facility] On 02/27/2015.   Why:  at 11:30am for wound check   Contact information:   [address] [place] [address]       Follow up with Alesia Richards, MD In 1 week.   Specialty:  Internal Medicine   Why:  for coumadin check   Contact information:    [address] [place] [address] [phone]       Duration of Discharge Encounter: Greater than 30 minutes including physician time.  Signed, Chanetta Marshall, NP 02/13/2015 8:00 AM  Will anticpate discharge this afternoon Instructions given BP tolerating the metoprolol Will change to bid

## 2015-02-12 NOTE — H&P (Signed)
ELECTROPHYSIOLOGY HISTORY AND PHYSICAL    Patient ID: LAURISSA COWPER MRN: [id number], DOB/AGE: 1922/01/28 81 y.o.  Admit date: 02/12/2015 Date of Consult: 02/12/2015  Primary Physician: Alesia Richards, MD Primary Cardiologist: Stanford Breed Electrophysiologist: [name]  CC: chest pain and tachypalpitations  HPI:  Erica Carroll is a 81 y.o. female with past medical history significant for GERD, prior TIA, sinus bradycardia and atrial fibrillation with RVR.  She has been maintained on Pindolol and Warfarin for anticoagulation.  She was recently admitted with pre-syncope and pacemaker implantation was recommended for tachy-brady syndrome, however, the patient was reluctant to proceed and was discharged home. She did well initially but awoke this morning with chest pain in the setting of AF with a ventricular rate in the 190's and called EMS.  She was transported to [facility] and was given Cardizem with improvement in ventricular rates and subsequent conversion to sinus bradycardia with rates 30-50's.  EP has been asked to admit for further evaluation.   Echocardiogram 01/2015 demonstrated normal LVEF, grade 3 diastolic dysfunction, LA 41, mild MR.  Lab work today is notable for INR of 2.99, sodium of 129, creat 1.15, normal troponin.  She currently denies chest pain, shortness of breath, LE edema, recent fevers, chills, nausea or vomiting.  She has not had recurrent dizziness or pre-syncope.   Past Medical History  Diagnosis Date  . TIA (transient ischemic attack)     a. with fall 05/2013 -> neg head CT and MRI/MRA;  b. Plavix started.  . A-fib     a. Dx 06/2013;  b. 06/2013 Echo:  EF 55-60%, no reg wma, mild AI, mod dil LA.  . Sinus bradycardia 07/11/2013  . Hyperlipidemia   . Vitamin D deficiency   . Allergy   . GERD (gastroesophageal reflux disease)   . Urinary incontinence      Surgical History:  Past Surgical History  Procedure Laterality Date  . Eye surgery Right 1994    CE/IOL    . Eye surgery Left 1998    CE/IOL  . Appendectomy  1936     No current facility-administered medications for this encounter.  Current outpatient prescriptions:  .  acetaminophen (TYLENOL) 325 MG tablet, Take 325 mg by mouth every 6 (six) hours as needed for mild pain. , Disp: , Rfl:  .  Cholecalciferol (VITAMIN D PO), Take 4,000 Units by mouth daily. , Disp: , Rfl:  .  GLUCOSAMINE-CHONDROITIN PO, Take 1 tablet by mouth daily., Disp: , Rfl:  .  MAGNESIUM OXIDE, ANTACID, PO, Take 1 tablet by mouth daily., Disp: , Rfl:  .  OVER THE COUNTER MEDICATION, Preserve vision vitamin, Disp: , Rfl:  .  polyethylene glycol (MIRALAX / GLYCOLAX) packet, Take 17 g by mouth daily as needed (constipation)., Disp: , Rfl:  .  tiZANidine (ZANAFLEX) 4 MG capsule, Take 1 capsule (4 mg total) by mouth 3 (three) times daily as needed for muscle spasms., Disp: 30 capsule, Rfl: 0 .  vitamin B-12 (CYANOCOBALAMIN) 100 MCG tablet, Take 50 mcg by mouth daily., Disp: , Rfl:  .  warfarin (COUMADIN) 5 MG tablet, Take As Directed by Coumadin Clinic (Patient taking differently: 5 mg. Pt takes 5mg  everyday except for Tues, Sat - pt takes 7.5mg  on Tues, Sat), Disp: 45 tablet, Rfl: 3   Inpatient Medications:   Allergies: No Known Allergies  History   Social History  . Marital Status: Married    Spouse Name: N/A  . Number of Children: N/A  . Years of  Education: N/A   Occupational History  . Not on file.   Social History Main Topics  . Smoking status: Never Smoker   . Smokeless tobacco: Never Used  . Alcohol Use: 0.0 oz/week    0 Standard drinks or equivalent per week     Comment: ocassional-rarely  . Drug Use: No  . Sexual Activity: No   Other Topics Concern  . Not on file   Social History Narrative   Lives in [place].  Recently widowed after nearly 59 years of marriage.       Family History  Problem Relation Age of Onset  . Heart disease Mother   . Heart disease Sister   . Heart disease Brother   .  Heart disease Brother   . CVA Sister   . Thyroid disease Sister      Review of Systems: All other systems reviewed and are otherwise negative except as noted above.  Physical Exam: Filed Vitals:   02/12/15 0800 02/12/15 0815 02/12/15 0830 02/12/15 0845  BP: 113/48 116/48 106/58 116/54  Pulse: 48 55 56 54  Temp:      TempSrc:      Resp: 14 24 18 17   SpO2: 99% 98% 99% 95%    GEN- The patient is elderly appearing, alert and oriented x 3 today.   HEENT: normocephalic, atraumatic; sclera clear, conjunctiva pink; hearing intact; oropharynx clear; neck supple, no JVP Lymph- no cervical lymphadenopathy Lungs- Clear to ausculation bilaterally, normal work of breathing.  No wheezes, rales, rhonchi Heart- Bradycardic regular rate and rhythm, no murmurs, rubs or gallops  GI- soft, non-tender, non-distended, bowel sounds present, no hepatosplenomegaly Extremities- no clubbing, cyanosis, or edema; DP/PT/radial pulses 2+ bilaterally MS- no significant deformity or atrophy Skin- warm and dry, no rash or lesion Psych- euthymic mood, full affect Neuro- strength and sensation are intact  Labs:   Lab Results  Component Value Date   WBC 7.3 02/12/2015   HGB 13.6 02/12/2015   HCT 38.6 02/12/2015   MCV 86.2 02/12/2015   PLT 196 02/12/2015    Recent Labs Lab 02/12/15 0340  NA 132*  K 3.9  CL 101  CO2 21*  BUN 11  CREATININE 1.15*  CALCIUM 9.0  GLUCOSE 131*      Radiology/Studies:  Dg Chest Port 1 View 02/12/2015   CLINICAL DATA:  Chest pain and shortness of breath.  EXAM: PORTABLE CHEST - 1 VIEW  COMPARISON:  01/28/2015  FINDINGS: Minimally displaced fracture right posterior lateral fifth and sixth rib, not definitively seen on prior exam. No pulmonary complication. Improving lung aeration with decreased cardiomegaly. Pulmonary vasculature is normal. No consolidation, pleural effusion, or pneumothorax. Old posterior left rib fractures.  IMPRESSION: Minimally displaced fractures right  posterior lateral fifth and sixth ribs, likely subacute. No pulmonary complication.   Electronically Signed   By: Jeb Levering M.D.   On: 02/12/2015 04:15    MEQ:ASTMH bradycardia, rate 44, RBBB, LAFB  TELEMETRY: atrial fibrillation with RVR and conversion to sinus bradycardia, rates 30-50's  Assessment/Plan: 1.  Tachy/brady syndrome The patient has paroxysmal atrial fibrillation with RVR as well as sinus bradycardia associated with pre-syncope.  PPM was offered to the patient last admission but she preferred to continue medical therapy.  With recurrent AF with RVR associated with chest pain and inability to further rate control due to bradycardia, she is willing to have pacemaker placement this admission.  Risks, benefits were reviewed with the patient who wishes to proceed.  Her INR is 2.99  today and so we will not be able to do today. Will hold Coumadin and plan PPM implantation tomorrow.  Continue Warfarin long-term for CHADS2VASC score of at least 3   2.  Symptomatic sinus bradycardia As above  3.  Chest pain Associated with AF with RVR.  Initial troponin negative.  Normal myoview last month. Will not pursue ischemic evaluation at this time.   Will admit to telemetry and plan PPM implantation today   Hold Warfarin.   Signed, Chanetta Marshall, NP 02/12/2015 9:17 AM   Pt with irreversible qand symptomatic tachybrady syndrome   Will proceed with pacing to allow for enhanced medical therpay for AF RVR  The benefits and risks were reviewed including but not limited to death,  perforation, infection, lead dislodgement and device malfunction.  The patient understands agrees and is willing to proceed. We have discussed that the INR is 2.9   There is both a literatuire and experience in pacing in this situation so will proceed

## 2015-02-12 NOTE — Telephone Encounter (Signed)
Spoke with amber np, patient has been seen and is scheduled for pacemaker tomorrow.

## 2015-02-12 NOTE — ED Notes (Signed)
EMS reports they were called for CP.  States patient was awakened from sleep with sharp chest pain, heart racing.  Has been monitored for bradycardia (currently on monitor).  EMS adm Cardizem 10mg  and then another 10mg  and heart rate came down to 120's from the 140-150's  BP originally 160/88.  Adm ASA 81mg  X4.  SOB at present [place] 4liters via [place], RA 95%

## 2015-02-12 NOTE — Progress Notes (Signed)
Utilization review completed. Contrina Orona, RN, BSN. 

## 2015-02-12 NOTE — Telephone Encounter (Signed)
Cardionet call.  autotriggered event - A Fib w HR 194 at 2:30am.  Fax pending.

## 2015-02-13 ENCOUNTER — Inpatient Hospital Stay (HOSPITAL_COMMUNITY): Payer: Medicare HMO

## 2015-02-13 ENCOUNTER — Encounter (HOSPITAL_COMMUNITY): Payer: Self-pay | Admitting: Internal Medicine

## 2015-02-13 LAB — CBC
HCT: 34.8 % — ABNORMAL LOW (ref 36.0–46.0)
HEMOGLOBIN: 12.2 g/dL (ref 12.0–15.0)
MCH: 30.6 pg (ref 26.0–34.0)
MCHC: 35.1 g/dL (ref 30.0–36.0)
MCV: 87.2 fL (ref 78.0–100.0)
PLATELETS: 195 10*3/uL (ref 150–400)
RBC: 3.99 MIL/uL (ref 3.87–5.11)
RDW: 12.7 % (ref 11.5–15.5)
WBC: 7.3 10*3/uL (ref 4.0–10.5)

## 2015-02-13 LAB — PROTIME-INR
INR: 2.96 — ABNORMAL HIGH (ref 0.00–1.49)
PROTHROMBIN TIME: 31 s — AB (ref 11.6–15.2)

## 2015-02-13 LAB — BASIC METABOLIC PANEL
Anion gap: 8 (ref 5–15)
BUN: 11 mg/dL (ref 6–20)
CALCIUM: 8.5 mg/dL — AB (ref 8.9–10.3)
CO2: 22 mmol/L (ref 22–32)
CREATININE: 1.04 mg/dL — AB (ref 0.44–1.00)
Chloride: 105 mmol/L (ref 101–111)
GFR calc Af Amer: 53 mL/min — ABNORMAL LOW (ref >60)
GFR, EST NON AFRICAN AMERICAN: 46 mL/min — AB (ref >60)
GLUCOSE: 92 mg/dL (ref 65–99)
Potassium: 3.9 mmol/L (ref 3.5–5.1)
Sodium: 135 mmol/L (ref 135–145)

## 2015-02-13 MED ORDER — METOPROLOL TARTRATE 50 MG PO TABS: 50.0000 mg | ORAL_TABLET | Freq: Two times a day (BID) | ORAL | Status: DC

## 2015-02-13 MED ORDER — ATENOLOL 50 MG PO TABS: 50.0000 mg | ORAL_TABLET | Freq: Every day | ORAL | Status: DC

## 2015-02-13 MED ORDER — VERAPAMIL HCL ER 120 MG PO TBCR: 120.0000 mg | EXTENDED_RELEASE_TABLET | Freq: Every day | ORAL | Status: DC

## 2015-02-13 MED ORDER — METOPROLOL TARTRATE 25 MG PO TABS: 25.0000 mg | ORAL_TABLET | Freq: Three times a day (TID) | ORAL | Status: DC

## 2015-02-13 MED ORDER — METOPROLOL SUCCINATE ER 50 MG PO TB24: 50.0000 mg | ORAL_TABLET | Freq: Every day | ORAL | Status: DC

## 2015-02-13 MED FILL — Cefazolin Sodium for IV Soln 2 GM and Dextrose 3% (50 ML): INTRAVENOUS | Qty: 50 | Status: AC

## 2015-02-13 MED FILL — Heparin Sodium (Porcine) 2 Unit/ML in Sodium Chloride 0.9%: INTRAMUSCULAR | Qty: 500 | Status: AC

## 2015-02-13 MED FILL — Lidocaine HCl Local Preservative Free (PF) Inj 1%: INTRAMUSCULAR | Qty: 30 | Status: AC

## 2015-02-13 NOTE — Discharge Summary (Signed)
At discharge meds were adjusted with lopressor changed to 50 mg every 12 hours.  If she does not tolerate she was given paper prescriptions to use toprol 50 mg, if that doesn't work then atenolol 50 mg, if that is not good then Verapamil 120 mg.  She was instructed to call office for change.

## 2015-02-13 NOTE — Discharge Instructions (Signed)
° ° °  Supplemental Discharge Instructions for  Pacemaker/Defibrillator Patients  Activity No heavy lifting or vigorous activity with your left/right arm for 6 to 8 weeks.  Do not raise your left/right arm above your head for one week.  Gradually raise your affected arm as drawn below.           __      02/16/15                   02/17/15                      02/18/15                  02/19/15   WOUND CARE - Keep the wound area clean and dry.  Do not get this area wet for one week. No showers for one week; you may shower on  02/19/15   . - The tape/steri-strips on your wound will fall off; do not pull them off.  No bandage is needed on the site.  DO  NOT apply any creams, oils, or ointments to the wound area. - If you notice any drainage or discharge from the wound, any swelling or bruising at the site, or you develop a fever > 101? F after you are discharged home, call the office at once.  Special Instructions - You are still able to use cellular telephones; use the ear opposite the side where you have your pacemaker/defibrillator.  Avoid carrying your cellular phone near your device. - When traveling through airports, show security personnel your identification card to avoid being screened in the metal detectors.  Ask the security personnel to use the hand wand. - Avoid arc welding equipment, MRI testing (magnetic resonance imaging), TENS units (transcutaneous nerve stimulators).  Call the office for questions about other devices. - Avoid electrical appliances that are in poor condition or are not properly grounded. - Microwave ovens are safe to be near or to operate.   If you have problems with the lopressor (metoprolol tartrate) 50 mg twice a day, then call office and we would change to either the Toprol, Atenolol or Verapamil.. I would wait and have those filled only as needed.

## 2015-02-19 ENCOUNTER — Institutional Professional Consult (permissible substitution): Payer: Medicare HMO | Admitting: Internal Medicine

## 2015-02-20 ENCOUNTER — Encounter: Payer: Self-pay | Admitting: Internal Medicine

## 2015-02-20 ENCOUNTER — Ambulatory Visit (INDEPENDENT_AMBULATORY_CARE_PROVIDER_SITE_OTHER): Payer: Medicare HMO | Admitting: Internal Medicine

## 2015-02-20 VITALS — BP 150/52 | HR 61 | Ht 64.0 in | Wt 138.6 lb

## 2015-02-20 DIAGNOSIS — R001 Bradycardia, unspecified: Secondary | ICD-10-CM | POA: Diagnosis not present

## 2015-02-20 DIAGNOSIS — I495 Sick sinus syndrome: Secondary | ICD-10-CM

## 2015-02-20 DIAGNOSIS — Z45018 Encounter for adjustment and management of other part of cardiac pacemaker: Secondary | ICD-10-CM

## 2015-02-20 DIAGNOSIS — I480 Paroxysmal atrial fibrillation: Secondary | ICD-10-CM

## 2015-02-20 DIAGNOSIS — I4891 Unspecified atrial fibrillation: Secondary | ICD-10-CM

## 2015-02-20 NOTE — Patient Instructions (Addendum)
Medication Instructions:  Your physician recommends that you continue on your current medications as directed. Please refer to the Current Medication list given to you today.  Labwork: None ordered  Testing/Procedures: None ordered  Follow-Up: Your physician recommends that you schedule a follow-up appointment in: 3 months with Dr.Klein.  Thank you for choosing [facility]!!

## 2015-02-20 NOTE — Progress Notes (Signed)
Patient Care Team: Unk Pinto, MD as PCP - General (Internal Medicine) [name], MD as Consulting Physician (Cardiology) Clent Jacks, MD as Consulting Physician (Ophthalmology)   HPI  Erica Carroll is a 81 y.o. female Seen following a recent hospitalization for tachybradycardia syndrome. She underwent pacing. We initiated beta blockers to try to protect against tachyarrhythmia with limitations of up titration related to blood pressure.. She comes in today her blood pressures at home and 105-130 range.  She has not had any intercurrent tachypalpitations.  Past Medical History  Diagnosis Date  . TIA (transient ischemic attack)     a. with fall 05/2013 -> neg head CT and MRI/MRA;  b. Plavix started.  . A-fib     a. Dx 06/2013;  b. 06/2013 Echo:  EF 55-60%, no reg wma, mild AI, mod dil LA.  . Sinus bradycardia 07/11/2013  . Hyperlipidemia   . Vitamin D deficiency   . Allergy   . GERD (gastroesophageal reflux disease)   . Urinary incontinence   . RA (rheumatoid arthritis)     " in my hands "    Past Surgical History  Procedure Laterality Date  . Eye surgery Right 1994    CE/IOL  . Eye surgery Left 1998    CE/IOL  . Appendectomy  1936  . Ep implantable device N/A 02/12/2015    Procedure: Pacemaker Implant;  Surgeon: Deboraha Sprang, MD;  Location: [facility];  Service: Cardiovascular;  Laterality: N/A;    Current Outpatient Prescriptions  Medication Sig Dispense Refill  . acetaminophen (TYLENOL) 325 MG tablet Take 325 mg by mouth every 6 (six) hours as needed for mild pain.     . Cholecalciferol (VITAMIN D PO) Take 4,000 Units by mouth daily.     Marland Kitchen GLUCOSAMINE-CHONDROITIN PO Take 1 tablet by mouth daily.    Marland Kitchen MAGNESIUM OXIDE, ANTACID, PO Take 1 tablet by mouth daily.    . metoprolol (LOPRESSOR) 50 MG tablet Take 1 tablet (50 mg total) by mouth 2 (two) times daily. 60 tablet 6  . OVER THE COUNTER MEDICATION Take 1 tablet by mouth daily. Preserve  vision vitamin    . polyethylene glycol (MIRALAX / GLYCOLAX) packet Take 17 g by mouth daily as needed (constipation).    Marland Kitchen tiZANidine (ZANAFLEX) 4 MG capsule Take 1 capsule (4 mg total) by mouth 3 (three) times daily as needed for muscle spasms. 30 capsule 0  . vitamin B-12 (CYANOCOBALAMIN) 100 MCG tablet Take 50 mcg by mouth daily.    Marland Kitchen warfarin (COUMADIN) 5 MG tablet Take As Directed by Coumadin Clinic (Patient taking differently: 5 mg. Pt takes 5mg  everyday except for Tues, Sat - pt takes 7.5mg  on Tues, Sat) 45 tablet 3   No current facility-administered medications for this visit.    No Known Allergies  Review of Systems negative except from HPI and PMH  Physical Exam BP 150/52 mmHg  Pulse 61  Ht 5\' 4"  (1.626 m)  Wt 138 lb 9.6 oz (62.869 kg)  BMI 23.78 kg/m2 Well developed and well nourished in no acute distress HENT normal E scleral and icterus clear Neck Supple JVP flat; carotids brisk and full Clear to ausculation  Regular rate and rhythm, no murmurs gallops or rub Soft with active bowel sounds No clubbing cyanosis  Edema Alert and oriented, grossly normal motor and sensory function Skin Warm and Dry    Assessment and  Plan  Paroxysmal atrial fibrillation with a very  rapid rate  Sinus bradycardia  Hypertension  Pacemaker-[employer]. Jude  Lead problem atrial sensitivity   The patient has had not had any intercurrent atrial fibrillation. There is some fatigue on her low-dose metoprolol. We will increase the evening dose as some blood pressures have been on the lower side. If the fatigue worsens she is to try either the atenolol to verapamil. I will see her in 3 months

## 2015-02-21 LAB — CUP PACEART INCLINIC DEVICE CHECK
Battery Remaining Longevity: 94.8 mo
Battery Voltage: 3.05 V
Brady Statistic RA Percent Paced: 94 %
Lead Channel Impedance Value: 550 Ohm
Lead Channel Impedance Value: 825 Ohm
Lead Channel Pacing Threshold Amplitude: 0.75 V
Lead Channel Pacing Threshold Pulse Width: 0.4 ms
Lead Channel Pacing Threshold Pulse Width: 0.4 ms
Lead Channel Sensing Intrinsic Amplitude: 0.4 mV
Lead Channel Sensing Intrinsic Amplitude: 12 mV
Lead Channel Setting Sensing Sensitivity: 2 mV
MDC IDC MSMT LEADCHNL RA PACING THRESHOLD AMPLITUDE: 0.75 V
MDC IDC MSMT LEADCHNL RA PACING THRESHOLD PULSEWIDTH: 0.4 ms
MDC IDC MSMT LEADCHNL RV PACING THRESHOLD AMPLITUDE: 0.375 V
MDC IDC PG SERIAL: 7759914
MDC IDC SESS DTM: 20160526170622
MDC IDC SET LEADCHNL RA PACING AMPLITUDE: 3.5 V
MDC IDC SET LEADCHNL RV PACING AMPLITUDE: 0.625
MDC IDC SET LEADCHNL RV PACING PULSEWIDTH: 0.4 ms
MDC IDC STAT BRADY RV PERCENT PACED: 3.1 %

## 2015-02-26 ENCOUNTER — Telehealth: Payer: Self-pay | Admitting: Internal Medicine

## 2015-02-26 NOTE — Telephone Encounter (Signed)
New message  Pt daughter called states that she received a call to come in for Wound check appt. Daughter states that the pt was recently see for wound check on 05/26. She req a call back to determine if the appt is needed. Please call

## 2015-02-26 NOTE — Telephone Encounter (Signed)
Informed pt daughter that the reason the lights are blinking and beeping on Tuesday morning was b/c it was sending a transmission to our office. Informed pt that device tech would review and if there was any issues he would call. Pt daughter verbalized understanding.

## 2015-02-26 NOTE — Telephone Encounter (Signed)
Advised that patient will not need to come to wound check tomorrow as she was checked last week (reviewed with device clinic).  Reminded of 3 mo f/u in August. She also asked about lights on device machine at home going crazy.  Informed that I would have device clinic call her to discuss this issue. Patient's dtr is agreeable.

## 2015-02-27 ENCOUNTER — Ambulatory Visit: Payer: Medicare HMO

## 2015-03-18 ENCOUNTER — Encounter: Payer: Self-pay | Admitting: Physician Assistant

## 2015-03-18 ENCOUNTER — Ambulatory Visit (INDEPENDENT_AMBULATORY_CARE_PROVIDER_SITE_OTHER): Payer: Medicare HMO | Admitting: Physician Assistant

## 2015-03-18 VITALS — BP 132/68 | HR 60 | Temp 97.9°F | Resp 16 | Ht 63.5 in | Wt 134.0 lb

## 2015-03-18 DIAGNOSIS — Z79899 Other long term (current) drug therapy: Secondary | ICD-10-CM

## 2015-03-18 DIAGNOSIS — Z7901 Long term (current) use of anticoagulants: Secondary | ICD-10-CM

## 2015-03-18 DIAGNOSIS — I480 Paroxysmal atrial fibrillation: Secondary | ICD-10-CM

## 2015-03-18 LAB — CBC WITH DIFFERENTIAL/PLATELET
Basophils Absolute: 0 10*3/uL (ref 0.0–0.1)
Basophils Relative: 0 % (ref 0–1)
Eosinophils Absolute: 0.2 10*3/uL (ref 0.0–0.7)
Eosinophils Relative: 3 % (ref 0–5)
HCT: 34.9 % — ABNORMAL LOW (ref 36.0–46.0)
Hemoglobin: 11.9 g/dL — ABNORMAL LOW (ref 12.0–15.0)
LYMPHS PCT: 33 % (ref 12–46)
Lymphs Abs: 2.3 10*3/uL (ref 0.7–4.0)
MCH: 30.7 pg (ref 26.0–34.0)
MCHC: 34.1 g/dL (ref 30.0–36.0)
MCV: 90.2 fL (ref 78.0–100.0)
MONOS PCT: 11 % (ref 3–12)
MPV: 9.3 fL (ref 8.6–12.4)
Monocytes Absolute: 0.8 10*3/uL (ref 0.1–1.0)
NEUTROS ABS: 3.7 10*3/uL (ref 1.7–7.7)
Neutrophils Relative %: 53 % (ref 43–77)
Platelets: 180 10*3/uL (ref 150–400)
RBC: 3.87 MIL/uL (ref 3.87–5.11)
RDW: 13.9 % (ref 11.5–15.5)
WBC: 7 10*3/uL (ref 4.0–10.5)

## 2015-03-18 LAB — BASIC METABOLIC PANEL WITH GFR
BUN: 13 mg/dL (ref 6–23)
CHLORIDE: 98 meq/L (ref 96–112)
CO2: 26 mEq/L (ref 19–32)
Calcium: 8.8 mg/dL (ref 8.4–10.5)
Creat: 1.01 mg/dL (ref 0.50–1.10)
GFR, EST AFRICAN AMERICAN: 57 mL/min — AB
GFR, Est Non African American: 49 mL/min — ABNORMAL LOW
GLUCOSE: 89 mg/dL (ref 70–99)
Potassium: 4.8 mEq/L (ref 3.5–5.3)
Sodium: 134 mEq/L — ABNORMAL LOW (ref 135–145)

## 2015-03-18 LAB — HEPATIC FUNCTION PANEL
ALT: 12 U/L (ref 0–35)
AST: 21 U/L (ref 0–37)
Albumin: 4 g/dL (ref 3.5–5.2)
Alkaline Phosphatase: 56 U/L (ref 39–117)
Bilirubin, Direct: 0.1 mg/dL (ref 0.0–0.3)
Indirect Bilirubin: 0.3 mg/dL (ref 0.2–1.2)
Total Bilirubin: 0.4 mg/dL (ref 0.2–1.2)
Total Protein: 6.6 g/dL (ref 6.0–8.3)

## 2015-03-18 NOTE — Progress Notes (Signed)
Assessment and plan: Chronic anticoagulation- check INR and will adjust medication according to labs.  Discussed if patient falls to immediately contact office or go to ER. Discussed foods that can increase or decrease Coumadin levels. Patient understands to call the office before starting a new medication. Follow up in one month LAB VISIT ONLY   Future Appointments Date Time Provider [place]  04/22/2015 2:30 PM Unk Pinto, MD [facility] None  05/15/2015 11:30 AM Deboraha Sprang, MD [facility]  02/03/2016 2:00 PM Unk Pinto, MD [facility] None    Coumadin follow up  Patient is on Coumadin for Primary Diagnosis: Paroxysmal atrial fibrillation [I48.0] Patient's last INR is  Lab Results  Component Value Date   INR 2.96* 02/13/2015   INR 2.99* 02/12/2015   INR 2.40* 01/29/2015    Patient denies SOB, CP, dizziness, nose bleeds, easy bleeding, and blood in stool/urine. Had recent pacemaker placement on 02/12/2015 with Dr. [name] for tachybrady syndrome.  Her coumadin dose was not changed last visit. 1 pill every day but Tuesday and Saturday 1.5 pill  She has not taken ABX, has not missed any doses and denies a fall.   Would like to only due lab visits due to age and not wanting to wait.     Current Outpatient Prescriptions on File Prior to Visit  Medication Sig Dispense Refill  . acetaminophen (TYLENOL) 325 MG tablet Take 325 mg by mouth every 6 (six) hours as needed for mild pain.     . Cholecalciferol (VITAMIN D PO) Take 4,000 Units by mouth daily.     Marland Kitchen GLUCOSAMINE-CHONDROITIN PO Take 1 tablet by mouth daily.    Marland Kitchen MAGNESIUM OXIDE, ANTACID, PO Take 1 tablet by mouth daily.    . metoprolol (LOPRESSOR) 50 MG tablet Take 1 tablet (50 mg total) by mouth 2 (two) times daily. 60 tablet 6  . OVER THE COUNTER MEDICATION Take 1 tablet by mouth daily. Preserve vision vitamin    . polyethylene glycol (MIRALAX / GLYCOLAX) packet Take 17 g by mouth daily as needed  (constipation).    Marland Kitchen tiZANidine (ZANAFLEX) 4 MG capsule Take 1 capsule (4 mg total) by mouth 3 (three) times daily as needed for muscle spasms. 30 capsule 0  . vitamin B-12 (CYANOCOBALAMIN) 100 MCG tablet Take 50 mcg by mouth daily.    Marland Kitchen warfarin (COUMADIN) 5 MG tablet Take As Directed by Coumadin Clinic (Patient taking differently: 5 mg. Pt takes 5mg  everyday except for Tues, Sat - pt takes 7.5mg  on Tues, Sat) 45 tablet 3   No current facility-administered medications on file prior to visit.   Past Medical History  Diagnosis Date  . TIA (transient ischemic attack)     a. with fall 05/2013 -> neg head CT and MRI/MRA;  b. Plavix started.  . A-fib     a. Dx 06/2013;  b. 06/2013 Echo:  EF 55-60%, no reg wma, mild AI, mod dil LA.  . Sinus bradycardia 07/11/2013  . Hyperlipidemia   . Vitamin D deficiency   . Allergy   . GERD (gastroesophageal reflux disease)   . Urinary incontinence   . RA (rheumatoid arthritis)     " in my hands "   No Known Allergies  ROS Constitutional: Denies fever, chills, headaches, fatigue. Cardio: Denies chest pain, palpitations, irregular heartbeat, syncope, dyspnea, diaphoresis, orthopnea, PND, claudication, edema Respiratory: denies cough, dyspnea, DOE, pleurisy, hoarseness, laryngitis, wheezing.  Gastrointestinal: Denies dysphagia, heartburn, reflux, pain, cramps, nausea, diarrhea, constipation, hematemesis, melena, hematochezia Genitourinary: Denies  dysuria, frequency, hematuria, flank pain Musculoskeletal: Denies arthralgia, myalgia, stiffness, Jt. Swelling, pain, limp, strain/sprain. Skin: Denies rash, ecchymosis, petechial. Neuro: Denies Weakness, tremor, incoordination, spasms, paresthesia, pain Heme/Lymph: Denies Excessive bleeding, bruising, enlarged lymph nodes  Physical: Blood pressure 132/68, pulse 60, temperature 97.9 F (36.6 C), resp. rate 16, height 5' 3.5" (1.613 m), weight 134 lb (60.782 kg). Filed Weights   03/18/15 1500  Weight: 134 lb  (60.782 kg)    General Appearance: Well nourished, in no apparent distress. ENT/Mouth: Nares clear with no erythema, swelling, mucus on turbinates. No ulcers, cracking, on lips. No erythema, swelling, or exudate on post pharynx.  Neck: Supple, thyroid normal.  Respiratory: CTAB   Cardio: RRR no murmurs, rubs or gallops. No edema, well healing horizonal scar from pacemaker placement on left chest with mild swelling, no warmth, tenderness, discharge.  Abdomen: Soft, with bowl sounds. Non tender, no guarding, rebound, hernias, masses, or organomegaly.  Skin: Warm, dry without rashes, lesions, ecchymosis.  Neuro: Unremarkable, walks with a cane

## 2015-03-19 LAB — PROTIME-INR
INR: 2.38 — ABNORMAL HIGH (ref <1.50)
Prothrombin Time: 26 seconds — ABNORMAL HIGH (ref 11.6–15.2)

## 2015-03-24 ENCOUNTER — Other Ambulatory Visit: Payer: Self-pay

## 2015-04-07 ENCOUNTER — Encounter: Payer: Self-pay | Admitting: Internal Medicine

## 2015-04-17 ENCOUNTER — Telehealth: Payer: Self-pay

## 2015-04-17 NOTE — Telephone Encounter (Signed)
[name] called and is asking if it is okay for patient to take Mucinex with current medications. Per Dr.McKeown, it is okay for patient to take Mucinex. [name] aware.

## 2015-04-22 ENCOUNTER — Encounter: Payer: Self-pay | Admitting: Internal Medicine

## 2015-04-22 ENCOUNTER — Ambulatory Visit (INDEPENDENT_AMBULATORY_CARE_PROVIDER_SITE_OTHER): Payer: Medicare HMO | Admitting: Internal Medicine

## 2015-04-22 VITALS — BP 126/68 | HR 64 | Temp 97.0°F | Resp 16 | Ht 63.5 in | Wt 136.8 lb

## 2015-04-22 DIAGNOSIS — I10 Essential (primary) hypertension: Secondary | ICD-10-CM

## 2015-04-22 DIAGNOSIS — Z5181 Encounter for therapeutic drug level monitoring: Secondary | ICD-10-CM

## 2015-04-22 DIAGNOSIS — R7309 Other abnormal glucose: Secondary | ICD-10-CM

## 2015-04-22 DIAGNOSIS — K219 Gastro-esophageal reflux disease without esophagitis: Secondary | ICD-10-CM

## 2015-04-22 DIAGNOSIS — I480 Paroxysmal atrial fibrillation: Secondary | ICD-10-CM

## 2015-04-22 DIAGNOSIS — Z0000 Encounter for general adult medical examination without abnormal findings: Secondary | ICD-10-CM

## 2015-04-22 DIAGNOSIS — Z7901 Long term (current) use of anticoagulants: Secondary | ICD-10-CM

## 2015-04-22 DIAGNOSIS — E785 Hyperlipidemia, unspecified: Secondary | ICD-10-CM

## 2015-04-22 DIAGNOSIS — J0410 Acute tracheitis without obstruction: Secondary | ICD-10-CM

## 2015-04-22 DIAGNOSIS — Z6823 Body mass index (BMI) 23.0-23.9, adult: Secondary | ICD-10-CM

## 2015-04-22 DIAGNOSIS — G459 Transient cerebral ischemic attack, unspecified: Secondary | ICD-10-CM

## 2015-04-22 DIAGNOSIS — E559 Vitamin D deficiency, unspecified: Secondary | ICD-10-CM

## 2015-04-22 DIAGNOSIS — R7303 Prediabetes: Secondary | ICD-10-CM

## 2015-04-22 LAB — CBC WITH DIFFERENTIAL/PLATELET
BASOS PCT: 0 % (ref 0–1)
Basophils Absolute: 0 10*3/uL (ref 0.0–0.1)
EOS ABS: 0.2 10*3/uL (ref 0.0–0.7)
EOS PCT: 2 % (ref 0–5)
HEMATOCRIT: 36 % (ref 36.0–46.0)
Hemoglobin: 12.4 g/dL (ref 12.0–15.0)
LYMPHS PCT: 34 % (ref 12–46)
Lymphs Abs: 2.7 10*3/uL (ref 0.7–4.0)
MCH: 30.7 pg (ref 26.0–34.0)
MCHC: 34.4 g/dL (ref 30.0–36.0)
MCV: 89.1 fL (ref 78.0–100.0)
MONO ABS: 0.8 10*3/uL (ref 0.1–1.0)
MPV: 9 fL (ref 8.6–12.4)
Monocytes Relative: 10 % (ref 3–12)
NEUTROS PCT: 54 % (ref 43–77)
Neutro Abs: 4.3 10*3/uL (ref 1.7–7.7)
PLATELETS: 209 10*3/uL (ref 150–400)
RBC: 4.04 MIL/uL (ref 3.87–5.11)
RDW: 13.8 % (ref 11.5–15.5)
WBC: 8 10*3/uL (ref 4.0–10.5)

## 2015-04-22 LAB — BASIC METABOLIC PANEL WITH GFR
BUN: 11 mg/dL (ref 7–25)
CALCIUM: 9.3 mg/dL (ref 8.6–10.4)
CO2: 23 meq/L (ref 20–31)
Chloride: 99 mEq/L (ref 98–110)
Creat: 1.09 mg/dL — ABNORMAL HIGH (ref 0.60–0.88)
GFR, EST NON AFRICAN AMERICAN: 45 mL/min — AB (ref >=60)
GFR, Est African American: 52 mL/min — ABNORMAL LOW (ref >=60)
Glucose, Bld: 101 mg/dL — ABNORMAL HIGH (ref 65–99)
Potassium: 4.6 mEq/L (ref 3.5–5.3)
Sodium: 134 mEq/L — ABNORMAL LOW (ref 135–146)

## 2015-04-22 LAB — HEMOGLOBIN A1C
HEMOGLOBIN A1C: 5.7 % — AB (ref <5.7)
Mean Plasma Glucose: 117 mg/dL — ABNORMAL HIGH (ref <117)

## 2015-04-22 LAB — LIPID PANEL
CHOL/HDL RATIO: 2.8 ratio (ref <=5.0)
Cholesterol: 129 mg/dL (ref 125–200)
HDL: 46 mg/dL (ref >=46)
LDL CALC: 60 mg/dL (ref <130)
Triglycerides: 117 mg/dL (ref <150)
VLDL: 23 mg/dL (ref <30)

## 2015-04-22 LAB — MAGNESIUM: MAGNESIUM: 2.1 mg/dL (ref 1.5–2.5)

## 2015-04-22 LAB — HEPATIC FUNCTION PANEL
ALT: 11 U/L (ref 6–29)
AST: 19 U/L (ref 10–35)
Albumin: 4.2 g/dL (ref 3.6–5.1)
Alkaline Phosphatase: 54 U/L (ref 33–130)
Bilirubin, Direct: 0.1 mg/dL (ref <=0.2)
Indirect Bilirubin: 0.3 mg/dL (ref 0.2–1.2)
Total Bilirubin: 0.4 mg/dL (ref 0.2–1.2)
Total Protein: 7 g/dL (ref 6.1–8.1)

## 2015-04-22 LAB — TSH: TSH: 0.574 u[IU]/mL (ref 0.350–4.500)

## 2015-04-22 MED ORDER — AZITHROMYCIN 250 MG PO TABS: ORAL_TABLET | ORAL | Status: DC

## 2015-04-22 NOTE — Progress Notes (Signed)
Patient ID: Erica Carroll, female   DOB: 1922/07/25, 81 y.o.   MRN: [id number]   This very nice 81 y.o. [facility] presents for follow up with Hypertension, Afib,  Hyperlipidemia, Pre-Diabetes and Vitamin D Deficiency.    Patient is treated for HTN & BP has been controlled at home. Today's BP: 126/68 mmHg. In May 2016, patient had a [employer] Pacemaker inserted by Dr [name] for pAfib w/RVR and Tachy/Brady syndrome and has been on Coumadin since.  Patient has had no complaints of any cardiac type chest pain, palpitations, dyspnea/orthopnea/PND, dizziness, claudication, or dependent edema.   Hyperlipidemia is controlled with diet. Patient denies myalgias or other med SE's. Last Lipids were at goal - Cholesterol 136; HDL 47; LDL Cholesterol 58; Triglycerides 154 on 01/07/2015.   Also, the patient has history of PreDiabetes and has had no symptoms of reactive hypoglycemia, diabetic polys, paresthesias or visual blurring.  Last A1c was  5.7% on 01/07/2015.   Further, the patient also has history of Vitamin D Deficiency and supplements vitamin D without any suspected side-effects. Last vitamin D was 45 on 01/07/2015.  Medication Sig  . acetaminophen  325 MG tablet Take 325 mg by mouth every 6 (six) hours as needed for mild pain.   Marland Kitchen VITAMIN D  Take 4,000 Units by mouth daily.   Marland Kitchen GLUCOSAMINE-CHONDROITIN PO Take 1 tablet by mouth daily.  Marland Kitchen MAGNESIUM OXIDE, ANTACID Take 1 tablet by mouth daily.  . metoprolol  50 MG tablet Take 1 tablet (50 mg total) by mouth 2 (two) times daily.  Marland Kitchen Preserve vision vitamin Take 1 tablet by mouth daily.   Marland Kitchen MIRALAX Take 17 g by mouth daily as needed (constipation).  . vitamin B-12  100 MCG tablet Take 50 mcg by mouth daily.  Marland Kitchen warfarin  5 MG tablet  takes 5mg  everyday except for Tues, Sat - pt takes 7.5mg  on Tues, Sat)   No Known Allergies  PMHx:   Past Medical History  Diagnosis Date  . TIA (transient ischemic attack)     a. with fall 05/2013 -> neg head CT and MRI/MRA;  b.  Plavix started.  . A-fib     a. Dx 06/2013;  b. 06/2013 Echo:  EF 55-60%, no reg wma, mild AI, mod dil LA.  . Sinus bradycardia 07/11/2013  . Hyperlipidemia   . Vitamin D deficiency   . Allergy   . GERD (gastroesophageal reflux disease)   . Urinary incontinence   . RA (rheumatoid arthritis)     " in my hands "   Immunization History  Administered Date(s) Administered  . Influenza, High Dose Seasonal PF 07/18/2014  . Influenza,inj,Quad PF,36+ Mos 07/26/2013  . Influenza-Unspecified 05/28/2012  . Pneumococcal-Unspecified 11/25/2008  . Td 09/13/2006   Past Surgical History  Procedure Laterality Date  . Eye surgery Right 1994    CE/IOL  . Eye surgery Left 1998    CE/IOL  . Appendectomy  1936  . Ep implantable device N/A 02/12/2015    Procedure: Pacemaker Implant;  Surgeon: Deboraha Sprang, MD;  Location: [facility];  Service: Cardiovascular;  Laterality: N/A;   FHx:    Reviewed / unchanged  SHx:    Reviewed / unchanged  Systems Review:  Constitutional: Denies fever, chills, wt changes, headaches, insomnia, fatigue, night sweats, change in appetite. Eyes: Denies redness, blurred vision, diplopia, discharge, itchy, watery eyes.  ENT: Denies discharge, congestion, post nasal drip, epistaxis, sore throat, earache, hearing loss, dental pain, tinnitus, vertigo, sinus  pain, snoring.  CV: Denies chest pain, palpitations, irregular heartbeat, syncope, dyspnea, diaphoresis, orthopnea, PND, claudication or edema. Respiratory: c/o recent productive cough and chest congestion Gastrointestinal: Denies dysphagia, odynophagia, heartburn, reflux, water brash, abdominal pain or cramps, nausea, vomiting, bloating, diarrhea, constipation, hematemesis, melena, hematochezia  or hemorrhoids. Genitourinary: Denies dysuria, frequency, urgency, nocturia, hesitancy, discharge, hematuria or flank pain. Musculoskeletal: Denies arthralgias, myalgias, stiffness, jt. swelling, pain, limping or  strain/sprain.  Skin: Denies pruritus, rash, hives, warts, acne, eczema or change in skin lesion(s). Neuro: No weakness, tremor, incoordination, spasms, paresthesia or pain. Psychiatric: Denies confusion, memory loss or sensory loss. Endo: Denies change in weight, skin or hair change.  Heme/Lymph: No excessive bleeding, bruising or enlarged lymph nodes.  Physical Exam  BP 126/68   Pulse 64  Temp 97 F   Resp 16  Ht 5' 3.5"   Wt 136 lb 12.8 oz     BMI 23.85   Appears well nourished and in no distress.  Eyes: PERRLA, EOMs, conjunctiva no swelling or erythema. Sinuses: No frontal/maxillary tenderness ENT/Mouth: EAC's clear, TM's nl w/o erythema, bulging. Nares clear w/o erythema, swelling, exudates. Oropharynx clear without erythema or exudates. Oral hygiene is good. Tongue normal, non obstructing. Hearing intact.  Neck: Supple. Thyroid nl. Car 2+/2+ without bruits, nodes or JVD. Chest: Respirations nl with BS clear & equal w/ few scattered  Rales and rhonchi, but w/o  wheezing or stridor.  Cor: Heart sounds normal w/ regular rate and rhythm without sig. murmurs, gallops, clicks, or rubs. Peripheral pulses normal and equal  without edema.  Abdomen: Soft & bowel sounds normal. Non-tender w/o guarding, rebound, hernias, masses, or organomegaly.  Lymphatics: Unremarkable.  Musculoskeletal: Full ROM all peripheral extremities, joint stability, 5/5 strength, and normal gait.  Skin: Warm, dry without exposed rashes, lesions or ecchymosis apparent.  Neuro: Cranial nerves intact, reflexes equal bilaterally. Sensory-motor testing grossly intact. Tendon reflexes grossly intact.  Pysch: Alert & oriented x 3.  Insight and judgement nl & appropriate. No ideations.  Assessment and Plan:  1. Essential hypertension  - TSH  2. Hyperlipidemia  - Lipid panel  3. Prediabetes  - Hemoglobin A1c - Insulin, random  4. Vitamin D deficiency  - Vit D  25 hydroxy   5. Gastroesophageal reflux  diseas   6. Paroxysmal atrial fibrillation   7. Transient cerebral ischemia   8. Encounter for therapeutic drug monitoring  - Protime-INR  9. Long term current use of anticoagulant therapy  - CBC with Differential/Platelet - BASIC METABOLIC PANEL WITH GFR - Hepatic function panel - Magnesium  10. Tracheitis  - azithromycin (ZITHROMAX) 250 MG tablet; Take 2 tablets (500 mg) on  Day 1,  followed by 1 tablet (250 mg) once daily on Days 2 through 5.  Dispense: 6 each; Refill: 1   Recommended regular exercise, BP monitoring, weight control, and discussed med and SE's. Recommended labs to assess and monitor clinical status. Further disposition pending results of labs. Over 30 minutes of exam, counseling, chart review was performed

## 2015-04-22 NOTE — Patient Instructions (Signed)

## 2015-04-23 LAB — PROTIME-INR
INR: 3.51 — ABNORMAL HIGH (ref <1.50)
Prothrombin Time: 35.2 seconds — ABNORMAL HIGH (ref 11.6–15.2)

## 2015-04-23 LAB — INSULIN, RANDOM: INSULIN: 5.7 u[IU]/mL (ref 2.0–19.6)

## 2015-04-23 LAB — VITAMIN D 25 HYDROXY (VIT D DEFICIENCY, FRACTURES): VIT D 25 HYDROXY: 58 ng/mL (ref 30–100)

## 2015-04-25 ENCOUNTER — Telehealth: Payer: Self-pay | Admitting: Internal Medicine

## 2015-04-25 ENCOUNTER — Telehealth: Payer: Self-pay | Admitting: Cardiology

## 2015-04-25 ENCOUNTER — Emergency Department (HOSPITAL_COMMUNITY): Payer: Medicare HMO

## 2015-04-25 ENCOUNTER — Emergency Department (HOSPITAL_COMMUNITY)
Admission: EM | Admit: 2015-04-25 | Discharge: 2015-04-25 | Disposition: A | Discharge status: Home / Self Care | Payer: Medicare HMO | Attending: Emergency Medicine | Admitting: Emergency Medicine

## 2015-04-25 ENCOUNTER — Encounter (HOSPITAL_COMMUNITY): Payer: Self-pay | Admitting: Emergency Medicine

## 2015-04-25 DIAGNOSIS — K219 Gastro-esophageal reflux disease without esophagitis: Secondary | ICD-10-CM | POA: Insufficient documentation

## 2015-04-25 DIAGNOSIS — Z8739 Personal history of other diseases of the musculoskeletal system and connective tissue: Secondary | ICD-10-CM | POA: Insufficient documentation

## 2015-04-25 DIAGNOSIS — R079 Chest pain, unspecified: Secondary | ICD-10-CM

## 2015-04-25 DIAGNOSIS — Z79899 Other long term (current) drug therapy: Secondary | ICD-10-CM | POA: Insufficient documentation

## 2015-04-25 DIAGNOSIS — I4891 Unspecified atrial fibrillation: Secondary | ICD-10-CM | POA: Diagnosis not present

## 2015-04-25 DIAGNOSIS — Z7901 Long term (current) use of anticoagulants: Secondary | ICD-10-CM | POA: Diagnosis not present

## 2015-04-25 DIAGNOSIS — E871 Hypo-osmolality and hyponatremia: Secondary | ICD-10-CM | POA: Diagnosis not present

## 2015-04-25 DIAGNOSIS — R000 Tachycardia, unspecified: Secondary | ICD-10-CM | POA: Diagnosis present

## 2015-04-25 DIAGNOSIS — Z8673 Personal history of transient ischemic attack (TIA), and cerebral infarction without residual deficits: Secondary | ICD-10-CM | POA: Diagnosis not present

## 2015-04-25 DIAGNOSIS — E559 Vitamin D deficiency, unspecified: Secondary | ICD-10-CM | POA: Diagnosis not present

## 2015-04-25 LAB — BASIC METABOLIC PANEL
ANION GAP: 9 (ref 5–15)
BUN: 15 mg/dL (ref 6–20)
CO2: 23 mmol/L (ref 22–32)
CREATININE: 1.09 mg/dL — AB (ref 0.44–1.00)
Calcium: 9.1 mg/dL (ref 8.9–10.3)
Chloride: 101 mmol/L (ref 101–111)
GFR calc Af Amer: 50 mL/min — ABNORMAL LOW (ref >60)
GFR, EST NON AFRICAN AMERICAN: 43 mL/min — AB (ref >60)
GLUCOSE: 115 mg/dL — AB (ref 65–99)
POTASSIUM: 4 mmol/L (ref 3.5–5.1)
Sodium: 133 mmol/L — ABNORMAL LOW (ref 135–145)

## 2015-04-25 LAB — PROTIME-INR
INR: 2.87 — ABNORMAL HIGH (ref 0.00–1.49)
Prothrombin Time: 29.6 seconds — ABNORMAL HIGH (ref 11.6–15.2)

## 2015-04-25 LAB — TROPONIN I
Troponin I: <0.03 ng/mL (ref <0.031)
Troponin I: 0.03 ng/mL (ref <0.031)

## 2015-04-25 MED ORDER — DILTIAZEM HCL 100 MG IV SOLR
5.0000 mg/h | INTRAVENOUS | Status: DC
  Administered 2015-04-25: 5 mg/h via INTRAVENOUS

## 2015-04-25 MED ORDER — DILTIAZEM HCL 25 MG/5ML IV SOLN: 15.0000 mg | Freq: Once | INTRAVENOUS | Status: DC

## 2015-04-25 MED ORDER — DILTIAZEM LOAD VIA INFUSION
10.0000 mg | Freq: Once | INTRAVENOUS | Status: AC
  Administered 2015-04-25: 10 mg via INTRAVENOUS
  Filled 2015-04-25: qty 10

## 2015-04-25 MED ORDER — DILTIAZEM HCL 30 MG PO TABS
30.0000 mg | ORAL_TABLET | Freq: Once | ORAL | Status: AC
  Administered 2015-04-25: 30 mg via ORAL
  Filled 2015-04-25: qty 1

## 2015-04-25 MED ORDER — DILTIAZEM HCL ER COATED BEADS 120 MG PO CP24: 120.0000 mg | ORAL_CAPSULE | Freq: Every day | ORAL | Status: DC

## 2015-04-25 NOTE — Telephone Encounter (Signed)
Daughter called and would like to get an appointment to see Dr. [name] earlier than 8/18.  5/18 Pacer per Dr. [name]  7/26 Started on Z pack  7/29 EMS to ED A fib RVR up to 170-180   2 doses 5 mg Cardizem  ED EKG A-Fib 140's  Rate slowed to 120's  PO cardizem PTD  DC on cardizem 120 mg in addition to normal medications.  Patient is doing fine today.  Daughter was concerned that patient was on Z pak and the possibility of this precipitating the event.  Also wanted to move up her appointment from 8/18  Discussed with Dr. [name].  Told daughter patient could continue with Z pack Told daughter that mother has an appointment with the A-Fib clinic on Monday April 28, 2015 at [facility] Daughter understands

## 2015-04-25 NOTE — Discharge Instructions (Signed)
Atrial Fibrillation °Atrial fibrillation is a type of irregular heart rhythm (arrhythmia). During atrial fibrillation, the upper chambers of the heart (atria) quiver continuously in a chaotic pattern. This causes an irregular and often rapid heart rate.  °Atrial fibrillation is the result of the heart becoming overloaded with disorganized signals that tell it to beat. These signals are normally released one at a time by a part of the right atrium called the sinoatrial node. They then travel from the atria to the lower chambers of the heart (ventricles), causing the atria and ventricles to contract and pump blood as they pass. In atrial fibrillation, parts of the atria outside of the sinoatrial node also release these signals. This results in two problems. First, the atria receive so many signals that they do not have time to fully contract. Second, the ventricles, which can only receive one signal at a time, beat irregularly and out of rhythm with the atria.  °There are three types of atrial fibrillation:  °· Paroxysmal. Paroxysmal atrial fibrillation starts suddenly and stops on its own within a week. °· Persistent. Persistent atrial fibrillation lasts for more than a week. It may stop on its own or with treatment. °· Permanent. Permanent atrial fibrillation does not go away. Episodes of atrial fibrillation may lead to permanent atrial fibrillation. °Atrial fibrillation can prevent your heart from pumping blood normally. It increases your risk of stroke and can lead to heart failure.  °CAUSES  °· Heart conditions, including a heart attack, heart failure, coronary artery disease, and heart valve conditions.   °· Inflammation of the sac that surrounds the heart (pericarditis). °· Blockage of an artery in the lungs (pulmonary embolism). °· Pneumonia or other infections. °· Chronic lung disease. °· Thyroid problems, especially if the thyroid is overactive (hyperthyroidism). °· Caffeine, excessive alcohol use, and use  of some illegal drugs.   °· Use of some medicines, including certain decongestants and diet pills. °· Heart surgery.   °· Birth defects.   °Sometimes, no cause can be found. When this happens, the atrial fibrillation is called lone atrial fibrillation. The risk of complications from atrial fibrillation increases if you have lone atrial fibrillation and you are age 60 years or older. °RISK FACTORS °· Heart failure. °· Coronary artery disease. °· Diabetes mellitus.   °· High blood pressure (hypertension).   °· Obesity.   °· Other arrhythmias.   °· Increased age. °SIGNS AND SYMPTOMS  °· A feeling that your heart is beating rapidly or irregularly.   °· A feeling of discomfort or pain in your chest.   °· Shortness of breath.   °· Sudden light-headedness or weakness.   °· Getting tired easily when exercising.   °· Urinating more often than normal (mainly when atrial fibrillation first begins).   °In paroxysmal atrial fibrillation, symptoms may start and suddenly stop. °DIAGNOSIS  °Your health care provider may be able to detect atrial fibrillation when taking your pulse. Your health care provider may have you take a test called an ambulatory electrocardiogram (ECG). An ECG records your heartbeat patterns over a 24-hour period. You may also have other tests, such as: °· Transthoracic echocardiogram (TTE). During echocardiography, sound waves are used to evaluate how blood flows through your heart. °· Transesophageal echocardiogram (TEE). °· Stress test. There is more than one type of stress test. If a stress test is needed, ask your health care provider about which type is best for you. °· Chest X-ray exam. °· Blood tests. °· Computed tomography (CT). °TREATMENT  °Treatment may include: °· Treating any underlying conditions. For example, if you   have an overactive thyroid, treating the condition may correct atrial fibrillation.  Taking medicine. Medicines may be given to control a rapid heart rate or to prevent blood  clots, heart failure, or a stroke.  Having a procedure to correct the rhythm of the heart:  Electrical cardioversion. During electrical cardioversion, a controlled, low-energy shock is delivered to the heart through your skin. If you have chest pain, very low blood pressure, or sudden heart failure, this procedure may need to be done as an emergency.  Catheter ablation. During this procedure, heart tissues that send the signals that cause atrial fibrillation are destroyed.  Surgical ablation. During this surgery, thin lines of heart tissue that carry the abnormal signals are destroyed. This procedure can either be an open-heart surgery or a minimally invasive surgery. With the minimally invasive surgery, small cuts are made to access the heart instead of a large opening.  Pulmonary venous isolation. During this surgery, tissue around the veins that carry blood from the lungs (pulmonary veins) is destroyed. This tissue is thought to carry the abnormal signals. HOME CARE INSTRUCTIONS   Take medicines only as directed by your health care provider. Some medicines can make atrial fibrillation worse or recur.  If blood thinners were prescribed by your health care provider, take them exactly as directed. Too much blood-thinning medicine can cause bleeding. If you take too little, you will not have the needed protection against stroke and other problems.  Perform blood tests at home if directed by your health care provider. Perform blood tests exactly as directed.  Quit smoking if you smoke.  Do not drink alcohol.  Do not drink caffeinated beverages such as coffee, soda, and some teas. You may drink decaffeinated coffee, soda, or tea.   Maintain a healthy weight.Do not use diet pills unless your health care provider approves. They may make heart problems worse.   Follow diet instructions as directed by your health care provider.  Exercise regularly as directed by your health care  provider.  Keep all follow-up visits as directed by your health care provider. This is important. PREVENTION  The following substances can cause atrial fibrillation to recur:   Caffeinated beverages.  Alcohol.  Certain medicines, especially those used for breathing problems.  Certain herbs and herbal medicines, such as those containing ephedra or ginseng.  Illegal drugs, such as cocaine and amphetamines. Sometimes medicines are given to prevent atrial fibrillation from recurring. Proper treatment of any underlying condition is also important in helping prevent recurrence.  SEEK MEDICAL CARE IF:  You notice a change in the rate, rhythm, or strength of your heartbeat.  You suddenly begin urinating more frequently.  You tire more easily when exerting yourself or exercising. SEEK IMMEDIATE MEDICAL CARE IF:   You have chest pain, abdominal pain, sweating, or weakness.  You feel nauseous.  You have shortness of breath.  You suddenly have swollen feet and ankles.  You feel dizzy.  Your face or limbs feel numb or weak.  You have a change in your vision or speech. MAKE SURE YOU:   Understand these instructions.  Will watch your condition.  Will get help right away if you are not doing well or get worse. Document Released: 09/13/2005 Document Revised: 01/28/2014 Document Reviewed: 10/24/2012 [facility] Patient Information 2015 [place], [place]. This information is not intended to replace advice given to you by your health care provider. Make sure you discuss any questions you have with your health care provider.  Diltiazem extended-release capsules or tablets  What is this medicine? DILTIAZEM (dil TYE a zem) is a calcium-channel blocker. It affects the amount of calcium found in your heart and muscle cells. This relaxes your blood vessels, which can reduce the amount of work the heart has to do. This medicine is used to treat high blood pressure and chest pain caused by  angina. This medicine may be used for other purposes; ask your health care provider or pharmacist if you have questions. COMMON BRAND NAME(S): Cardizem CD, Cardizem LA, Cardizem SR, Cartia XT, Dilacor XR, Dilt-CD, Diltia XT, Diltzac, Matzim LA, Rema Fendt, Tiamate, Tiazac What should I tell my health care provider before I take this medicine? They need to know if you have any of these conditions: -heart problems, low blood pressure, irregular heartbeat -liver disease -previous heart attack -an unusual or allergic reaction to diltiazem, other medicines, foods, dyes, or preservatives -pregnant or trying to get pregnant -breast-feeding How should I use this medicine? Take this medicine by mouth with a glass of water. Follow the directions on the prescription label. Swallow whole, do not crush or chew. Ask your doctor or pharmacist if your should take this medicine with food. Take your doses at regular intervals. Do not take your medicine more often then directed. Do not stop taking except on the advice of your doctor or health care professional. Ask your doctor or health care professional how to gradually reduce the dose. Talk to your pediatrician regarding the use of this medicine in children. Special care may be needed. Overdosage: If you think you have taken too much of this medicine contact a poison control center or emergency room at once. NOTE: This medicine is only for you. Do not share this medicine with others. What if I miss a dose? If you miss a dose, take it as soon as you can. If it is almost time for your next dose, take only that dose. Do not take double or extra doses. What may interact with this medicine? Do not take this medicine with any of the following medications: -cisapride -hawthorn -pimozide -ranolazine -red yeast rice This medicine may also interact with the following medications: -buspirone -carbamazepine -cimetidine -cyclosporine -digoxin -local anesthetics or  general anesthetics -lovastatin -medicines for anxiety or difficulty sleeping like midazolam and triazolam -medicines for high blood pressure or heart problems -quinidine -rifampin, rifabutin, or rifapentine This list may not describe all possible interactions. Give your health care provider a list of all the medicines, herbs, non-prescription drugs, or dietary supplements you use. Also tell them if you smoke, drink alcohol, or use illegal drugs. Some items may interact with your medicine. What should I watch for while using this medicine? Check your blood pressure and pulse rate regularly. Ask your doctor or health care professional what your blood pressure and pulse rate should be and when you should contact him or her. You may feel dizzy or lightheaded. Do not drive, use machinery, or do anything that needs mental alertness until you know how this medicine affects you. To reduce the risk of dizzy or fainting spells, do not sit or stand up quickly, especially if you are an older patient. Alcohol can make you more dizzy or increase flushing and rapid heartbeats. Avoid alcoholic drinks. What side effects may I notice from receiving this medicine? Side effects that you should report to your doctor or health care professional as soon as possible: -allergic reactions like skin rash, itching or hives, swelling of the face, lips, or tongue -confusion, mental depression -feeling faint  or lightheaded, falls -redness, blistering, peeling or loosening of the skin, including inside the mouth -slow, irregular heartbeat -swelling of the feet and ankles -unusual bleeding or bruising, pinpoint red spots on the skin Side effects that usually do not require medical attention (report to your doctor or health care professional if they continue or are bothersome): -constipation or diarrhea -difficulty sleeping -facial flushing -headache -nausea, vomiting -sexual dysfunction -weak or tired This list may not  describe all possible side effects. Call your doctor for medical advice about side effects. You may report side effects to FDA at 1-800-FDA-1088. Where should I keep my medicine? Keep out of the reach of children. Store at room temperature between 15 and 30 degrees C (59 and 86 degrees F). Protect from humidity. Throw away any unused medicine after the expiration date. NOTE: This sheet is a summary. It may not cover all possible information. If you have questions about this medicine, talk to your doctor, pharmacist, or health care provider.  2015, [employer]. (2008-01-04 14:35:47)

## 2015-04-25 NOTE — Telephone Encounter (Signed)
[name](daughter) is calling because [name] had to go to the ED early this morning ( 1am) for AFIB and was given a new medication Diltiazem 24hr cd 120 mg one pill a day . Also she Z-pac and is on day 4 of the 5 day prescription and wants to know should she be stopping the Zpac instead of starting a new medication. Please call   Thanks

## 2015-04-25 NOTE — ED Notes (Signed)
Pt presents with [facility] for CP and tachycardia; pt arrives in ER with Afib c RVR with HR in 180s; pt reports central CP and lightheadedness after getting out of bed for sip of water; EMS gave 2 boluses of 5mg  Cardizem and 5mg  drip cardizem without conversion; pt reports recently being tx for bronchial infection with z-pak and decreasing coumadin dose; pt CAOx4 at this time

## 2015-04-25 NOTE — ED Provider Notes (Signed)
CSN: [id number]     Arrival date & time 04/25/15  0155 History  This chart was scribed for Erica Fuel, MD by Erica Carroll, ED Scribe. This patient was seen in room A01C/A01C and the patient's care was started at 1:57 AM.  Chief Complaint  Patient presents with  . Chest Pain  . Tachycardia   The history is provided by the patient. No language interpreter was used.     HPI Comments: Erica Carroll is a 81 y.o. female brought in by ambulance, with hx atrial fibrillation who presents to the Emergency Department complaining of sudden onset, diffuse, chest pain that began tonight around 11:30 PM (approximately 2.5 hours ago). Pt mentions that she woke up to go to the bathroom when she began having a near syncopal feeling with associated dizziness. She states that the chest pain came on shortly afterwards. Pt cannot describe the chest pain or rate it on a scale of 1-10. Pt also complains of tachycardia, prompting her to call EMS. She mentions similar diffuse chest pain in the past couple of days but denies any tachycardia at that time. EMS reports RVR and HR of 180 en route. She was treated with 5 mg diltiazem twice without conversion. Pt denies shortness of breath, nausea, vomiting, diaphoresis, or any other associated symptoms.    Past Medical History  Diagnosis Date  . TIA (transient ischemic attack)     a. with fall 05/2013 -> neg head CT and MRI/MRA;  b. Plavix started.  . A-fib     a. Dx 06/2013;  b. 06/2013 Echo:  EF 55-60%, no reg wma, mild AI, mod dil LA.  . Sinus bradycardia 07/11/2013  . Hyperlipidemia   . Vitamin D deficiency   . Allergy   . GERD (gastroesophageal reflux disease)   . Urinary incontinence   . RA (rheumatoid arthritis)     " in my hands "   Past Surgical History  Procedure Laterality Date  . Eye surgery Right 1994    CE/IOL  . Eye surgery Left 1998    CE/IOL  . Appendectomy  1936  . Ep implantable device N/A 02/12/2015    Procedure: Pacemaker Implant;   Surgeon: Deboraha Sprang, MD;  Location: [facility];  Service: Cardiovascular;  Laterality: N/A;   Family History  Problem Relation Age of Onset  . Heart disease Mother   . Heart disease Sister   . Heart disease Brother   . Heart disease Brother   . CVA Sister   . Thyroid disease Sister    History  Substance Use Topics  . Smoking status: Never Smoker   . Smokeless tobacco: Never Used  . Alcohol Use: 0.0 oz/week    0 Standard drinks or equivalent per week     Comment: ocassional-rarely   OB History    No data available     Review of Systems  Constitutional: Negative for diaphoresis.  Respiratory: Negative for shortness of breath.   Cardiovascular: Positive for chest pain and palpitations.  Gastrointestinal: Negative for nausea and vomiting.  Neurological: Positive for dizziness.  All other systems reviewed and are negative.     Allergies  Review of patient's allergies indicates no known allergies.  Home Medications   Prior to Admission medications   Medication Sig Start Date End Date Taking? Authorizing Provider  acetaminophen (TYLENOL) 325 MG tablet Take 325 mg by mouth every 6 (six) hours as needed for mild pain.     Historical Provider, MD  azithromycin (ZITHROMAX) 250 MG tablet Take 2 tablets (500 mg) on  Day 1,  followed by 1 tablet (250 mg) once daily on Days 2 through 5. 04/22/15 05/23/15  Unk Pinto, MD  Cholecalciferol (VITAMIN D PO) Take 4,000 Units by mouth daily.     Historical Provider, MD  GLUCOSAMINE-CHONDROITIN PO Take 1 tablet by mouth daily.    Historical Provider, MD  MAGNESIUM OXIDE, ANTACID, PO Take 1 tablet by mouth daily.    Historical Provider, MD  metoprolol (LOPRESSOR) 50 MG tablet Take 1 tablet (50 mg total) by mouth 2 (two) times daily. 02/13/15   [name], NP  OVER THE COUNTER MEDICATION Take 1 tablet by mouth daily. Preserve vision vitamin    Historical Provider, MD  polyethylene glycol (MIRALAX / GLYCOLAX) packet Take 17 g  by mouth daily as needed (constipation).    Historical Provider, MD  vitamin B-12 (CYANOCOBALAMIN) 100 MCG tablet Take 50 mcg by mouth daily.    Historical Provider, MD  warfarin (COUMADIN) 5 MG tablet Take As Directed by [facility] Patient taking differently: 5 mg. Pt takes 5mg  everyday except for Tues, Sat - pt takes 7.5mg  on Tues, Sat 12/24/14   Erica Perla, MD   Triage Vitals: BP 118/83 mmHg  Pulse 46  Temp(Src) 98.2 F (36.8 C)  Resp 21  SpO2 99%   Physical Exam  Constitutional: She is oriented to person, place, and time. She appears well-developed and well-nourished. No distress.  Appears mildly dyspneic  HENT:  Head: Normocephalic and atraumatic.  Eyes: EOM are normal. Pupils are equal, round, and reactive to light.  Neck: Normal range of motion. Neck supple. No JVD present.  Cardiovascular: An irregular rhythm present. Tachycardia present.   Pulmonary/Chest: Effort normal and breath sounds normal. She has no wheezes. She has no rales. She exhibits no tenderness.  Abdominal: Soft. Bowel sounds are normal. She exhibits no distension and no mass. There is no tenderness.  Musculoskeletal: Normal range of motion. She exhibits no edema.  Lymphadenopathy:    She has no cervical adenopathy.  Neurological: She is alert and oriented to person, place, and time. No cranial nerve deficit. She exhibits normal muscle tone. Coordination normal.  Skin: Skin is warm and dry. No rash noted.  Psychiatric: She has a normal mood and affect. Her behavior is normal. Judgment and thought content normal.  Nursing note and vitals reviewed.   ED Course  Procedures (including critical care time)  DIAGNOSTIC STUDIES: Oxygen Saturation is 99% on RA, normal by my interpretation.    COORDINATION OF CARE: 2:08 AM-Discussed treatment plan which includes CXR, BMP, Protime INR with pt at bedside and pt agreed to plan.   Labs Review Labs Reviewed  TROPONIN I  BASIC METABOLIC PANEL  PROTIME-INR     Imaging Review No results found.   EKG Interpretation   Date/Time:  Friday April 25 2015 02:12:17 EDT Ventricular Rate:  147 PR Interval:    QRS Duration: 125 QT Interval:  314 QTC Calculation: 491 R Axis:   -65 Text Interpretation:  Wide-QRS tachycardia RBBB and LAFB When compared  with ECG of 02/13/2015, Atrial fibrillation with rapid ventricular response  has replaced ATRIAL PACED RHYTHM Confirmed by Roxanne Mins  MD, Nykiah Ma ([id number]) on  04/25/2015 6:45:23 AM    .    EKG Interpretation   Date/Time:  Friday April 25 2015 02:34:31 EDT Ventricular Rate:  63 PR Interval:  210 QRS Duration: 135 QT Interval:  416 QTC Calculation: 426 R Axis:   -  53 Text Interpretation:  Atrial-paced complexes RBBB and LAFB Probable left  ventricular hypertrophy When compared with ECG of 04/25/2015 at 0212,  ATRIAL PACED RHYTHM has replaced Atrial fibrillation with rapid  ventricular response Confirmed by [facility]  MD, Raequan Vanschaick ([id number]) on 04/25/2015  6:46:45 AM      CRITICAL CARE Performed by: HFGBM,SXJDB Total critical care time: 50 minutes Critical care time was exclusive of separately billable procedures and treating other patients. Critical care was necessary to treat or prevent imminent or life-threatening deterioration. Critical care was time spent personally by me on the following activities: development of treatment plan with patient and/or surrogate as well as nursing, discussions with consultants, evaluation of patient's response to treatment, examination of patient, obtaining history from patient or surrogate, ordering and performing treatments and interventions, ordering and review of laboratory studies, ordering and review of radiographic studies, pulse oximetry and re-evaluation of patient's condition.  MDM   Final diagnoses:  Chest pain, unspecified chest pain type  Atrial fibrillation with rapid ventricular response  Anticoagulated on warfarin  Hyponatremia    Atrial fibrillation  with rapid ventricular response. Chest pain is most likely related to the heart rate. She had been given diltiazem in the ambulance without any discernible effect on her heart rate. He is given a higher dose of diltiazem and heart rate is come down to about 120. At this point, she states that her chest pain had resolved. Her old records are reviewed and she has been followed by cardiology for atrial fibrillation and is anticoagulated for that. Additional diltiazem was ordered, but patient spontaneously converted to an atrial paced rhythm. Initial troponin was negative. It was elected to keep her in the ED to get a second troponin level. She was given oral diltiazem and the drip was discontinued. Clearly, her current dose of beta blocker is insufficient to control rate when she flips into atrial fibrillation. She is discharged with prescription filled diltiazem and is to follow-up with her cardiologist in the next week.   I personally performed the services described in this documentation, which was scribed in my presence. The recorded information has been reviewed and is accurate.       Erica Fuel, MD 52/08/02 [id number]

## 2015-04-25 NOTE — Telephone Encounter (Signed)
Per Pt's daughter:    Pt's Daughter Needs a call back she has a question about the pt's [employer] pt had and episode after taking this.      ER doctor also started her on an additional medication Diltiazen 120mg .  And she would like a back please. ASAP  She would also like an earlier apt. than the 8/18.

## 2015-04-25 NOTE — Telephone Encounter (Signed)
Spoke with Erasmo Downer who said patient should finish z-pac and take diltiazem as directed. Patient's daughter was informed of this - she states she is a [employer] scientist and looked it up online that she should not take z-pac because of her heart condition. Reiterated information per [place].   Daughter is requesting an appointment for her mother with Dr. Stanford Breed.  Will routed to [facility] scheduling pool

## 2015-04-27 ENCOUNTER — Encounter: Payer: Self-pay | Admitting: Internal Medicine

## 2015-04-28 ENCOUNTER — Ambulatory Visit (HOSPITAL_COMMUNITY)
Admission: RE | Admit: 2015-04-28 | Discharge: 2015-04-28 | Disposition: A | Discharge status: Home / Self Care | Payer: Medicare HMO | Source: Ambulatory Visit | Attending: Nurse Practitioner | Admitting: Nurse Practitioner

## 2015-04-28 ENCOUNTER — Other Ambulatory Visit: Payer: Self-pay

## 2015-04-28 ENCOUNTER — Encounter (HOSPITAL_COMMUNITY): Payer: Self-pay | Admitting: Nurse Practitioner

## 2015-04-28 VITALS — BP 114/52 | HR 64 | Ht 64.0 in | Wt 135.2 lb

## 2015-04-28 DIAGNOSIS — Z7901 Long term (current) use of anticoagulants: Secondary | ICD-10-CM | POA: Insufficient documentation

## 2015-04-28 DIAGNOSIS — Z8673 Personal history of transient ischemic attack (TIA), and cerebral infarction without residual deficits: Secondary | ICD-10-CM | POA: Diagnosis not present

## 2015-04-28 DIAGNOSIS — Z79899 Other long term (current) drug therapy: Secondary | ICD-10-CM | POA: Diagnosis not present

## 2015-04-28 DIAGNOSIS — E785 Hyperlipidemia, unspecified: Secondary | ICD-10-CM | POA: Diagnosis not present

## 2015-04-28 DIAGNOSIS — K219 Gastro-esophageal reflux disease without esophagitis: Secondary | ICD-10-CM | POA: Insufficient documentation

## 2015-04-28 DIAGNOSIS — E559 Vitamin D deficiency, unspecified: Secondary | ICD-10-CM | POA: Diagnosis not present

## 2015-04-28 DIAGNOSIS — Z8249 Family history of ischemic heart disease and other diseases of the circulatory system: Secondary | ICD-10-CM | POA: Diagnosis not present

## 2015-04-28 DIAGNOSIS — Z950 Presence of cardiac pacemaker: Secondary | ICD-10-CM | POA: Insufficient documentation

## 2015-04-28 DIAGNOSIS — I480 Paroxysmal atrial fibrillation: Secondary | ICD-10-CM | POA: Diagnosis not present

## 2015-04-28 DIAGNOSIS — M069 Rheumatoid arthritis, unspecified: Secondary | ICD-10-CM | POA: Diagnosis not present

## 2015-04-28 NOTE — Progress Notes (Signed)
Patient ID: Erica Carroll, female   DOB: 23-Aug-1922, 81 y.o.   MRN: [id number]     Primary Care Physician: Alesia Richards, MD Referring Physician: ER f/u   Erica Carroll is a 81 y.o. female with a h/o PAF,PPM that had recent ER  episode of afib with RVR associated with chest pain and lightheadedness. She was placed on daily cardizem after converting with po cardizem  in the ER and asked to f/u in the Afib clinic. EKG today is paced rhythm. The pt and her daughter feel like the afib was triggered by a recnt URI and treatment with a zpack that she finished over the weekend. She has not had any further issues with fast heart rate since ER. She feels well. The daughter is questioning if she needs to stay on daily cardizem vrs taking as needed. BP so far as been ok on drug and no significant side effects. She continues on warfarin with most recent INR of 2.87.  Today, she denies symptoms of palpitations, chest pain, shortness of breath, orthopnea, PND, lower extremity edema, dizziness, presyncope, syncope, or neurologic sequela. The patient is tolerating medications without difficulties and is otherwise without complaint today.   Past Medical History  Diagnosis Date  . TIA (transient ischemic attack)     a. with fall 05/2013 -> neg head CT and MRI/MRA;  b. Plavix started.  . A-fib     a. Dx 06/2013;  b. 06/2013 Echo:  EF 55-60%, no reg wma, mild AI, mod dil LA.  . Sinus bradycardia 07/11/2013  . Hyperlipidemia   . Vitamin D deficiency   . Allergy   . GERD (gastroesophageal reflux disease)   . Urinary incontinence   . RA (rheumatoid arthritis)     " in my hands "   Past Surgical History  Procedure Laterality Date  . Eye surgery Right 1994    CE/IOL  . Eye surgery Left 1998    CE/IOL  . Appendectomy  1936  . Ep implantable device N/A 02/12/2015    Procedure: Pacemaker Implant;  Surgeon: Deboraha Sprang, MD;  Location: [facility];  Service: Cardiovascular;  Laterality: N/A;     Current Outpatient Prescriptions  Medication Sig Dispense Refill  . acetaminophen (TYLENOL) 325 MG tablet Take 325 mg by mouth every 6 (six) hours as needed for mild pain.     Marland Kitchen b complex vitamins tablet Take 1 tablet by mouth daily.    . Cholecalciferol (VITAMIN D PO) Take 4,000 Units by mouth daily.     Marland Kitchen diltiazem (CARDIZEM CD) 120 MG 24 hr capsule Take 1 capsule (120 mg total) by mouth daily. 30 capsule 0  . GLUCOSAMINE-CHONDROITIN PO Take 1 tablet by mouth daily.    Marland Kitchen MAGNESIUM OXIDE, ANTACID, PO Take 1 tablet by mouth daily.    . metoprolol (LOPRESSOR) 50 MG tablet Take 1 tablet (50 mg total) by mouth 2 (two) times daily. 60 tablet 6  . OVER THE COUNTER MEDICATION Take 1 tablet by mouth daily. Preserve vision vitamin    . polyethylene glycol (MIRALAX / GLYCOLAX) packet Take 17 g by mouth daily as needed (constipation).    . warfarin (COUMADIN) 5 MG tablet Take As Directed by [facility] (Patient taking differently: Take 2.5 mg by mouth daily. ) 45 tablet 3   No current facility-administered medications for this encounter.    No Known Allergies  History   Social History  . Marital Status: Married    Spouse Name:  N/A  . Number of Children: N/A  . Years of Education: N/A   Occupational History  . Not on file.   Social History Main Topics  . Smoking status: Never Smoker   . Smokeless tobacco: Never Used  . Alcohol Use: 0.0 oz/week    0 Standard drinks or equivalent per week     Comment: ocassional-rarely  . Drug Use: No  . Sexual Activity: No   Other Topics Concern  . Not on file   Social History Narrative   Lives in [place].  Recently widowed after nearly 22 years of marriage.      Family History  Problem Relation Age of Onset  . Heart disease Mother   . Heart disease Sister   . Heart disease Brother   . Heart disease Brother   . CVA Sister   . Thyroid disease Sister     ROS- All systems are reviewed and negative except as per the HPI above  Physical  Exam: Filed Vitals:   04/28/15 1419  BP: 114/52  Pulse: 64  Height: 5\' 4"  (1.626 m)  Weight: 135 lb 3.2 oz (61.326 kg)    GEN- The patient is well appearing, alert and oriented x 3 today.   Head- normocephalic, atraumatic Eyes-  Sclera clear, conjunctiva pink Ears- hearing intact Oropharynx- clear Neck- supple, no JVP Lymph- no cervical lymphadenopathy Lungs- Clear to ausculation bilaterally, normal work of breathing Heart- Regular rate and rhythm, no murmurs, rubs or gallops, PMI not laterally displaced GI- soft, NT, ND, + BS Extremities- no clubbing, cyanosis, or edema MS- no significant deformity or atrophy Skin- no rash or lesion Psych- euthymic mood, full affect Neuro- strength and sensation are intact  EKG-atrial paced rhythm with prolonged av conduction with premature atrial comlexes. LAD, RBBB. PPM not interrogated today. Epic records reviewed  Assessment and Plan:  1. PAF with RVR Recent episode possibly triggered per pt by recent URI and zpack For now continue cardizem 120 mg a day as well as metoprolol 50 mg a day. If she develops weakness/lightheadedness/hypotension, may need to make cardizem short acting and give as a pill in the pocket.  F/u with Dr. [name] as scheduled 8/18.

## 2015-05-01 NOTE — Telephone Encounter (Signed)
Patient saw D. Kayleen Memos, NP in AF clinic on 8/1

## 2015-05-15 ENCOUNTER — Encounter: Payer: Self-pay | Admitting: Internal Medicine

## 2015-05-15 ENCOUNTER — Ambulatory Visit (INDEPENDENT_AMBULATORY_CARE_PROVIDER_SITE_OTHER): Payer: Medicare HMO | Admitting: Internal Medicine

## 2015-05-15 VITALS — BP 124/66 | HR 60 | Ht 64.5 in | Wt 137.0 lb

## 2015-05-15 DIAGNOSIS — I495 Sick sinus syndrome: Secondary | ICD-10-CM

## 2015-05-15 DIAGNOSIS — I480 Paroxysmal atrial fibrillation: Secondary | ICD-10-CM

## 2015-05-15 DIAGNOSIS — Z45018 Encounter for adjustment and management of other part of cardiac pacemaker: Secondary | ICD-10-CM | POA: Diagnosis not present

## 2015-05-15 MED ORDER — DILTIAZEM HCL 30 MG PO TABS
30.0000 mg | ORAL_TABLET | Freq: Every day | ORAL | Status: DC | PRN
Start: 2015-05-15 — End: 2016-09-01

## 2015-05-15 MED ORDER — METOPROLOL TARTRATE 75 MG PO TABS: 75.0000 mg | ORAL_TABLET | Freq: Two times a day (BID) | ORAL | Status: DC

## 2015-05-15 NOTE — Patient Instructions (Addendum)
Medication Instructions:  Your physician has recommended you make the following change in your medication: 1) STOP Diltiazem CD 2) START Diltiazem 30 mg once daily as needed for Afib episodes 3) IN 3 WEEKS -- INCREASE Metoprolol to 75 mg twice daily  Labwork: None ordered  Testing/Procedures: None ordered  Follow-Up: Your physician recommends that you schedule a follow-up appointment in: 6 weeks with Roderic Palau, NP.  Remote monitoring is used to monitor your pacemaker from home. This monitoring reduces the number of office visits required to check your device to one time per year. It allows [place] to keep an eye on the functioning of your device to ensure it is working properly. You are scheduled for a device check from home on 08-14-2015. You may send your transmission at any time that day. If you have a wireless device, the transmission will be sent automatically. After your physician reviews your transmission, you will receive a postcard with your next transmission date.  Your physician recommends that you schedule a follow-up appointment in: 9 months with Dr.Klein  Any Other Special Instructions Will Be Listed Below (If Applicable). Thank you for choosing [facility]!!

## 2015-05-15 NOTE — Progress Notes (Signed)
Patient Care Team: Unk Pinto, MD as PCP - General (Internal Medicine) [name], MD as Consulting Physician (Cardiology) Clent Jacks, MD as Consulting Physician (Ophthalmology)   HPI  Erica Carroll is a 81 y.o. female Seen following a recent hospitalization for tachybradycardia syndrome. She underwent pacing. We initiated beta blockers to try to protect against tachyarrhythmia with limitations of up titration related to blood pressure.. She comes in today her blood pressures at home and 105-130 range.   She was seen recently in the hospital because of atrial fibrillation with a rapid rate. This episode lasted, by device interrogation, about 40 minutes. It was associated with chest pain and lightheadedness and stopped concurrent with by mouth Cardizem. She is put on long-acting Cardizem and since that time has felt terrible with fatigue and lassitude. Is noteworthy that the episodes of atrial fibrillation at the end of July were associated with intercurrent illness for which she is being treated with anti-biotics.  She has had no subsequent palpitations.  Past Medical History  Diagnosis Date  . TIA (transient ischemic attack)     a. with fall 05/2013 -> neg head CT and MRI/MRA;  b. Plavix started.  . A-fib     a. Dx 06/2013;  b. 06/2013 Echo:  EF 55-60%, no reg wma, mild AI, mod dil LA.  . Sinus bradycardia 07/11/2013  . Hyperlipidemia   . Vitamin D deficiency   . Allergy   . GERD (gastroesophageal reflux disease)   . Urinary incontinence   . RA (rheumatoid arthritis)     " in my hands "    Past Surgical History  Procedure Laterality Date  . Eye surgery Right 1994    CE/IOL  . Eye surgery Left 1998    CE/IOL  . Appendectomy  1936  . Ep implantable device N/A 02/12/2015    Procedure: Pacemaker Implant;  Surgeon: Deboraha Sprang, MD;  Location: [facility];  Service: Cardiovascular;  Laterality: N/A;    Current Outpatient Prescriptions  Medication  Sig Dispense Refill  . acetaminophen (TYLENOL) 325 MG tablet Take 325 mg by mouth every 6 (six) hours as needed for mild pain.     Marland Kitchen b complex vitamins tablet Take 1 tablet by mouth daily.    . Cholecalciferol (VITAMIN D PO) Take 4,000 Units by mouth daily.     Marland Kitchen diltiazem (CARDIZEM CD) 120 MG 24 hr capsule Take 1 capsule (120 mg total) by mouth daily. 30 capsule 0  . GLUCOSAMINE-CHONDROITIN PO Take 1 tablet by mouth daily.    Marland Kitchen MAGNESIUM OXIDE, ANTACID, PO Take 1 tablet by mouth daily.    . metoprolol (LOPRESSOR) 50 MG tablet Take 1 tablet (50 mg total) by mouth 2 (two) times daily. 60 tablet 6  . OVER THE COUNTER MEDICATION Take 1 tablet by mouth daily. Preserve vision vitamin    . polyethylene glycol (MIRALAX / GLYCOLAX) packet Take 17 g by mouth daily as needed (constipation).    . warfarin (COUMADIN) 5 MG tablet Take As Directed by [facility] (Patient taking differently: Take 2.5 mg by mouth daily. ) 45 tablet 3   No current facility-administered medications for this visit.    No Known Allergies  Review of Systems negative except from HPI and PMH  Physical Exam BP 124/66 mmHg  Pulse 60  Ht 5' 4.5" (1.638 m)  Wt 137 lb (62.143 kg)  BMI 23.16 kg/m2 Well developed and well nourished in no acute distress HENT  normal E scleral and icterus clear Neck Supple JVP flat; carotids brisk and full Clear to ausculation  Regular rate and rhythm, no murmurs gallops or rub Soft with active bowel sounds No clubbing cyanosis  Edema Alert and oriented, gro ssly normal motor and sensory function Skin Warm and Dry  ecg a PACING INT intervals 22/12/43 with right bundle branch block left axis deviation and deep anterior T-wave inversions the T-wave inversions are new since 5/16 as well as compared 04/28/15  Assessment and  Plan  Paroxysmal atrial fibrillation with a very rapid rate  Sinus bradycardia  Hypertension  Pacemaker-[employer] Jude  Abnormal electrocardiogram  The patient had  symptomatic atrial fibrillation with a rapid rate and is now been treated with Cardizem which has been associated temporally with profound fatigue. We'll stop the Cardizem and see if it's the culprit.  Given the ECG abnormalities, I am inclined to increase the beta blocker; if her symptoms have returned to baseline by 3 weeks we will begin her back on the metoprolol. We will have her follow up with Roderic Palau in about 6 weeks to address the following 3 issues #1, as he ECG changed and is not is anything further needs to be done in this regard i.e. evaluation for ischemia #2-D she tolerating the higher doses of beta blockers #3-is the role for antiarrhythmics therapy given the frequency of symptomatic episodes of atrial fibrillation at this point the strategy would be metoprolol dose is increased from 50-75 and when necessary Cardizem to take with the recurrent event using 30 mg

## 2015-05-16 LAB — CUP PACEART INCLINIC DEVICE CHECK
Battery Remaining Percentage: 95 %
Battery Voltage: 3.01 V
Brady Statistic RA Percent Paced: 91 %
Date Time Interrogation Session: 20160819102911
Lead Channel Impedance Value: 510 Ohm
Lead Channel Pacing Threshold Amplitude: 0.5 V
Lead Channel Pacing Threshold Pulse Width: 0.4 ms
Lead Channel Pacing Threshold Pulse Width: 0.4 ms
Lead Channel Sensing Intrinsic Amplitude: 0.6 mV
Lead Channel Setting Pacing Amplitude: 0.625
Lead Channel Setting Pacing Amplitude: 3.5 V
Lead Channel Setting Sensing Sensitivity: 2 mV
MDC IDC MSMT LEADCHNL RA PACING THRESHOLD AMPLITUDE: 0.5 V
MDC IDC MSMT LEADCHNL RV IMPEDANCE VALUE: 750 Ohm
MDC IDC MSMT LEADCHNL RV SENSING INTR AMPL: 12 mV — AB
MDC IDC SET LEADCHNL RV PACING PULSEWIDTH: 0.4 ms
MDC IDC STAT BRADY RV PERCENT PACED: 1 % — AB
Pulse Gen Serial Number: 7759914

## 2015-06-09 ENCOUNTER — Ambulatory Visit (INDEPENDENT_AMBULATORY_CARE_PROVIDER_SITE_OTHER): Payer: Medicare HMO | Admitting: *Deleted

## 2015-06-09 DIAGNOSIS — G459 Transient cerebral ischemic attack, unspecified: Secondary | ICD-10-CM

## 2015-06-09 DIAGNOSIS — I480 Paroxysmal atrial fibrillation: Secondary | ICD-10-CM | POA: Diagnosis not present

## 2015-06-09 DIAGNOSIS — Z7901 Long term (current) use of anticoagulants: Secondary | ICD-10-CM

## 2015-06-09 DIAGNOSIS — Z5181 Encounter for therapeutic drug level monitoring: Secondary | ICD-10-CM | POA: Diagnosis not present

## 2015-06-09 LAB — POCT INR: INR: 2.6

## 2015-06-11 ENCOUNTER — Encounter: Payer: Self-pay | Admitting: Internal Medicine

## 2015-06-30 ENCOUNTER — Inpatient Hospital Stay (HOSPITAL_COMMUNITY): Admission: RE | Admit: 2015-06-30 | Payer: Medicare HMO | Source: Ambulatory Visit | Admitting: Nurse Practitioner

## 2015-07-11 ENCOUNTER — Other Ambulatory Visit: Payer: Self-pay | Admitting: Cardiology

## 2015-07-11 ENCOUNTER — Ambulatory Visit (INDEPENDENT_AMBULATORY_CARE_PROVIDER_SITE_OTHER): Payer: Medicare HMO | Admitting: Pharmacist

## 2015-07-11 DIAGNOSIS — Z5181 Encounter for therapeutic drug level monitoring: Secondary | ICD-10-CM

## 2015-07-11 DIAGNOSIS — I480 Paroxysmal atrial fibrillation: Secondary | ICD-10-CM

## 2015-07-11 DIAGNOSIS — G459 Transient cerebral ischemic attack, unspecified: Secondary | ICD-10-CM | POA: Diagnosis not present

## 2015-07-11 DIAGNOSIS — Z7901 Long term (current) use of anticoagulants: Secondary | ICD-10-CM | POA: Diagnosis not present

## 2015-07-11 LAB — POCT INR: INR: 2

## 2015-07-11 MED ORDER — WARFARIN SODIUM 5 MG PO TABS: ORAL_TABLET | ORAL | Status: DC

## 2015-07-23 ENCOUNTER — Ambulatory Visit: Payer: Self-pay | Admitting: Internal Medicine

## 2015-08-14 ENCOUNTER — Telehealth: Payer: Self-pay | Admitting: Internal Medicine

## 2015-08-14 ENCOUNTER — Ambulatory Visit (INDEPENDENT_AMBULATORY_CARE_PROVIDER_SITE_OTHER): Payer: Medicare HMO | Admitting: *Deleted

## 2015-08-14 DIAGNOSIS — I495 Sick sinus syndrome: Secondary | ICD-10-CM

## 2015-08-14 NOTE — Progress Notes (Signed)
Remote pacemaker transmission.   

## 2015-08-14 NOTE — Telephone Encounter (Signed)
New problem    Pt need to know how to perform her device check per daughter calling.

## 2015-08-14 NOTE — Telephone Encounter (Signed)
Informed pt daughter that pt remote transmission was received and her home monitor check it automatically. Pt daughter verbalized understanding.

## 2015-08-15 ENCOUNTER — Ambulatory Visit (INDEPENDENT_AMBULATORY_CARE_PROVIDER_SITE_OTHER): Payer: Medicare HMO | Admitting: *Deleted

## 2015-08-15 DIAGNOSIS — Z7901 Long term (current) use of anticoagulants: Secondary | ICD-10-CM | POA: Diagnosis not present

## 2015-08-15 DIAGNOSIS — Z5181 Encounter for therapeutic drug level monitoring: Secondary | ICD-10-CM | POA: Diagnosis not present

## 2015-08-15 DIAGNOSIS — I480 Paroxysmal atrial fibrillation: Secondary | ICD-10-CM | POA: Diagnosis not present

## 2015-08-15 DIAGNOSIS — G459 Transient cerebral ischemic attack, unspecified: Secondary | ICD-10-CM

## 2015-08-15 LAB — POCT INR: INR: 2.5

## 2015-08-19 ENCOUNTER — Other Ambulatory Visit: Payer: Self-pay

## 2015-08-19 MED ORDER — METOPROLOL TARTRATE 75 MG PO TABS: 75.0000 mg | ORAL_TABLET | Freq: Two times a day (BID) | ORAL | Status: DC

## 2015-08-29 LAB — CUP PACEART REMOTE DEVICE CHECK
Battery Remaining Longevity: 126 mo
Battery Remaining Percentage: 95.5 %
Brady Statistic AP VP Percent: 1 %
Brady Statistic AP VS Percent: 93 %
Brady Statistic AS VP Percent: 1 %
Brady Statistic AS VS Percent: 4.8 %
Date Time Interrogation Session: 20161117085408
Implantable Lead Implant Date: 20160518
Implantable Lead Location: 753859
Implantable Lead Model: 1948
Lead Channel Impedance Value: 550 Ohm
Lead Channel Pacing Threshold Amplitude: 0.5 V
Lead Channel Pacing Threshold Pulse Width: 0.4 ms
Lead Channel Pacing Threshold Pulse Width: 0.4 ms
Lead Channel Sensing Intrinsic Amplitude: 0.5 mV
Lead Channel Sensing Intrinsic Amplitude: 12 mV
Lead Channel Setting Pacing Amplitude: 0.75 V
Lead Channel Setting Pacing Amplitude: 2 V
Lead Channel Setting Pacing Pulse Width: 0.4 ms
Lead Channel Setting Sensing Sensitivity: 2 mV
MDC IDC LEAD IMPLANT DT: 20160518
MDC IDC LEAD LOCATION: 753860
MDC IDC LEAD MODEL: 1944
MDC IDC MSMT BATTERY VOLTAGE: 3.01 V
MDC IDC MSMT LEADCHNL RA PACING THRESHOLD AMPLITUDE: 0.5 V
MDC IDC MSMT LEADCHNL RV IMPEDANCE VALUE: 680 Ohm
MDC IDC STAT BRADY RA PERCENT PACED: 93 %
MDC IDC STAT BRADY RV PERCENT PACED: 1 %
Pulse Gen Serial Number: 7759914

## 2015-09-01 ENCOUNTER — Encounter: Payer: Self-pay | Admitting: Cardiology

## 2015-09-05 DIAGNOSIS — Z23 Encounter for immunization: Secondary | ICD-10-CM | POA: Diagnosis not present

## 2015-09-15 ENCOUNTER — Encounter: Payer: Self-pay | Admitting: Internal Medicine

## 2015-09-15 ENCOUNTER — Ambulatory Visit (INDEPENDENT_AMBULATORY_CARE_PROVIDER_SITE_OTHER): Payer: Medicare HMO | Admitting: Internal Medicine

## 2015-09-15 VITALS — BP 118/60 | HR 68 | Temp 98.1°F | Resp 14 | Ht 64.5 in | Wt 128.0 lb

## 2015-09-15 DIAGNOSIS — R7309 Other abnormal glucose: Secondary | ICD-10-CM | POA: Diagnosis not present

## 2015-09-15 DIAGNOSIS — Z79899 Other long term (current) drug therapy: Secondary | ICD-10-CM | POA: Diagnosis not present

## 2015-09-15 DIAGNOSIS — I10 Essential (primary) hypertension: Secondary | ICD-10-CM | POA: Diagnosis not present

## 2015-09-15 DIAGNOSIS — R7303 Prediabetes: Secondary | ICD-10-CM | POA: Diagnosis not present

## 2015-09-15 DIAGNOSIS — E785 Hyperlipidemia, unspecified: Secondary | ICD-10-CM | POA: Diagnosis not present

## 2015-09-15 DIAGNOSIS — B029 Zoster without complications: Secondary | ICD-10-CM | POA: Diagnosis not present

## 2015-09-15 DIAGNOSIS — E559 Vitamin D deficiency, unspecified: Secondary | ICD-10-CM

## 2015-09-15 MED ORDER — VALACYCLOVIR HCL 1 G PO TABS: ORAL_TABLET | ORAL | Status: DC

## 2015-09-15 MED ORDER — HYDROCODONE-ACETAMINOPHEN 5-325 MG PO TABS: ORAL_TABLET | ORAL | Status: DC

## 2015-09-15 MED ORDER — PREGABALIN 50 MG PO CAPS: ORAL_CAPSULE | ORAL | Status: DC

## 2015-09-15 MED ORDER — PREDNISONE 20 MG PO TABS: ORAL_TABLET | ORAL | Status: DC

## 2015-09-15 NOTE — Progress Notes (Signed)
Patient ID: Erica Carroll, female   DOB: Jul 04, 1922, 81 y.o.   MRN: [id number]   This very nice 81 y.o. [facility] presents for  follow up with Hypertension, Hyperlipidemia, Pre-Diabetes and Vitamin D Deficiency.  Since last OV patient has had a PPM implanted and is followed on coumadin at the [facility]. Today she presents with a 2-3 day hx/o a painful rash of the R posterior scalp, neck and shoulder area. Pain is interrupting sleep.   Patient is treated for HTN circa 1960's & BP has been controlled at home. Today's BP: 118/60 mmHg. Patient has had no complaints of any cardiac type chest pain, palpitations, dyspnea/orthopnea/PND, dizziness, claudication, or dependent edema.   Hyperlipidemia is controlled with diet.  Last Lipids were at goal with Cholesterol 129; HDL 46; LDL 60; Triglycerides 117 on 04/22/2015.   Also, the patient has history of PreDiabetes with A1c 5.6% and elevated insulin 39 in 2012 and has had no symptoms of reactive hypoglycemia, diabetic polys, paresthesias or visual blurring.  Last A1c was 5.7% on7/26/2016.    Further, the patient also has history of Vitamin D Deficiency of 43 on supplements in 2008 and supplements vitamin D without any suspected side-effects. Last vitamin D was 58 on 04/22/2015.  Medication Sig  . acetaminophen 325 MG tablet Take 325 mg by mouth every 6 (six) hours as needed for mild pain.   Marland Kitchen b complex vitamins tablet Take 1 tablet by mouth daily.  . Cholecalciferol (VITAMIN D PO) Take 4,000 Units by mouth daily.   Marland Kitchen diltiazem (CARDIZEM) 30 MG tablet Take 1 tablet (30 mg total) by mouth daily as needed. For Afib episodes  . GLUCOSAMINE-CHONDROITIN  Take 1 tablet by mouth daily.  Marland Kitchen MAGNESIUM OXIDE, ANTACID Take 1 tablet by mouth daily.  . Metoprolol Tartrate 75 MG TABS Take 75 mg by mouth 2 (two) times daily.  Marland Kitchen Preserve vision vitamin Take 1 tablet by mouth daily.   Marland Kitchen MIRALAX  Take 17 g by mouth daily as needed (constipation).  . warfarin (COUMADIN) 5  MG tablet Take As Directed by [facility] Clinic   No Known Allergies  PMHx:   Past Medical History  Diagnosis Date  . TIA (transient ischemic attack)     a. with fall 05/2013 -> neg head CT and MRI/MRA;  b. Plavix started.  . A-fib ([place])     a. Dx 06/2013;  b. 06/2013 Echo:  EF 55-60%, no reg wma, mild AI, mod dil LA.  . Sinus bradycardia 07/11/2013  . Hyperlipidemia   . Vitamin D deficiency   . Allergy   . GERD (gastroesophageal reflux disease)   . Urinary incontinence   . RA (rheumatoid arthritis) ([place])     " in my hands "   Immunization History  Administered Date(s) Administered  . Influenza, High Dose Seasonal PF 07/18/2014  . Influenza,inj,Quad PF,36+ Mos 07/26/2013  . Influenza-Unspecified 05/28/2012  . Pneumococcal-Unspecified 11/25/2008  . Td 09/13/2006   Past Surgical History  Procedure Laterality Date  . Eye surgery Right 1994    CE/IOL  . Eye surgery Left 1998    CE/IOL  . Appendectomy  1936  . Ep implantable device N/A 02/12/2015    Procedure: Pacemaker Implant;  Surgeon: Deboraha Sprang, MD;  Location: [facility];  Service: Cardiovascular;  Laterality: N/A;   FHx:    Reviewed / unchanged  SHx:    Reviewed / unchanged  Systems Review:  Constitutional: Denies fever, chills, wt changes, headaches, insomnia,  fatigue, night sweats, change in appetite. Eyes: Denies redness, blurred vision, diplopia, discharge, itchy, watery eyes.  ENT: Denies discharge, congestion, post nasal drip, epistaxis, sore throat, earache, hearing loss, dental pain, tinnitus, vertigo, sinus pain, snoring.  CV: Denies chest pain, palpitations, irregular heartbeat, syncope, dyspnea, diaphoresis, orthopnea, PND, claudication or edema. Respiratory: denies cough, dyspnea, DOE, pleurisy, hoarseness, laryngitis, wheezing.  Gastrointestinal: Denies dysphagia, odynophagia, heartburn, reflux, water brash, abdominal pain or cramps, nausea, vomiting, bloating, diarrhea, constipation, hematemesis,  melena, hematochezia  or hemorrhoids. Genitourinary: Denies dysuria, frequency, urgency, nocturia, hesitancy, discharge, hematuria or flank pain. Musculoskeletal: Denies arthralgias, myalgias, stiffness, jt. swelling, pain, limping or strain/sprain.  Skin: Denies pruritus, rash, hives, warts, acne, eczema or change in skin lesion(s). Neuro: No weakness, tremor, incoordination, spasms, paresthesia or pain. Psychiatric: Denies confusion, memory loss or sensory loss. Endo: Denies change in weight, skin or hair change.  Heme/Lymph: No excessive bleeding, bruising or enlarged lymph nodes.  Physical Exam  BP 118/60 mmHg  Pulse 68  Temp(Src) 98.1 F (36.7 C) (Temporal)  Resp 14  Ht 5' 4.5" (1.638 m)  Wt 128 lb (58.06 kg)  BMI 21.64 kg/m2  SpO2 97%  Appears uncomfortable.  Eyes: PERRLA, EOMs, conjunctiva no swelling or erythema. Sinuses: No frontal/maxillary tenderness ENT/Mouth: EAC's clear, TM's nl w/o erythema, bulging. Nares clear w/o erythema, swelling, exudates. Oropharynx clear without erythema or exudates. Oral hygiene is good. Tongue normal, non obstructing. Hearing intact.  Neck: Supple. Thyroid nl. Car 2+/2+ without bruits, nodes or JVD. Chest: Respirations nl with BS clear & equal w/o rales, rhonchi, wheezing or stridor.  Cor: Heart sounds normal w/ regular rate and rhythm without sig. murmurs, gallops, clicks, or rubs. Peripheral pulses normal and equal  without edema.  Abdomen: Soft & bowel sounds normal. Non-tender w/o guarding, rebound, hernias, masses, or organomegaly.  Lymphatics: Unremarkable.  Musculoskeletal: Full ROM all peripheral extremities, joint stability, 5/5 strength, and normal gait.  Skin: Classic zoster raised red rash w/o vesicles of the R C2-C4 dermatomes. No signs of cellulitis/lymphangitis. Neuro: Cranial nerves intact, reflexes equal bilaterally. Sensory-motor testing grossly intact. Tendon reflexes grossly intact.  Pysch: Alert & oriented x 3.  Insight  and judgement nl & appropriate. No ideations.  Assessment and Plan:  1. Essential hypertension  - TSH  2. Prediabetes  - Hemoglobin A1c - Insulin, random  3. Vitamin D deficiency  - VITAMIN D 25 Hydroxy (Vit-D Deficiency, Fractures)  4. Herpes zoster  - valACYclovir (VALTREX) 1000 MG tablet; Take 1 tablet 3 x day for Shingles  Dispense: 21 tablet; Refill: 0 - predniSONE (DELTASONE) 20 MG tablet; 1 tab 3 x day for 3 days, then 1 tab 2 x day for 3 days, then 1 tab 1 x day for 5 days  Dispense: 20 tablet; Refill: 0 - HYDROcodone-acetaminophen (NORCO) 5-325 MG tablet; Take 1/2 to 1 tablet 3 x day if needed for pain  Dispense: 30 tablet; Refill: 0 - pregabalin (LYRICA) 50 MG capsule; Take 1 capsule 3 x day for shingles pain  Dispense: 21 capsule; Refill: 0 - ROV 2 weeks  5. Hyperlipidemia   6. Medication management  - CBC with Differential/Platelet - BASIC METABOLIC PANEL WITH GFR - Hepatic function panel - Magnesium   Recommended regular exercise, BP monitoring, weight control, and discussed med and SE's. Recommended labs to assess and monitor clinical status. Further disposition pending results of labs. Over 30 minutes of exam, counseling, chart review was performed

## 2015-09-15 NOTE — Patient Instructions (Addendum)
Shingles Shingles is an infection that causes a painful skin rash and fluid-filled blisters. Shingles is caused by the same virus that causes chickenpox. Shingles only develops in people who:  Have had chickenpox.  Have gotten the chickenpox vaccine. (This is rare.) The first symptoms of shingles may be itching, tingling, or pain in an area on your skin. A rash will follow in a few days or weeks. The rash is usually on one side of the body in a bandlike or beltlike pattern. Over time, the rash turns into fluid-filled blisters that break open, scab over, and dry up. Medicines may:  Help you manage pain.  Help you recover more quickly.  Help to prevent long-term problems. HOME CARE Medicines  Take medicines only as told by your doctor.  Apply an anti-itch or numbing cream to the affected area as told by your doctor. Blister and Rash Care  Take a cool bath or put cool compresses on the area of the rash or blisters as told by your doctor. This may help with pain and itching.  Keep your rash covered with a loose bandage (dressing). Wear loose-fitting clothing.  Keep your rash and blisters clean with mild soap and cool water or as told by your doctor.  Check your rash every day for signs of infection. These include redness, swelling, and pain that lasts or gets worse.  Do not pick your blisters.  Do not scratch your rash. General Instructions  Rest as told by your doctor.  Keep all follow-up visits as told by your doctor. This is important.  Until your blisters scab over, your infection can cause chickenpox in people who have never had it or been vaccinated against it. To prevent this from happening, avoid touching other people or being around other people, especially:  Babies.  Pregnant women.  Children who have eczema.  Elderly people who have transplants.  People who have chronic illnesses, such as leukemia or AIDS. GET HELP IF:  Your pain does not get better with  medicine.  Your pain does not get better after the rash heals.  Your rash looks infected. Signs of infection include:  Redness.  Swelling.  Pain that lasts or gets worse. GET HELP RIGHT AWAY IF:  The rash is on your face or nose.  You have pain in your face, pain around your eye area, or loss of feeling on one side of your face.  You have ear pain or you have ringing in your ear.  You have loss of taste.  Your condition gets worse.   This information is not intended to replace advice given to you by your health care provider. Make sure you discuss any questions you have with your health care provider.   Document Released: 03/01/2008 Document Revised: 10/04/2014 Document Reviewed: 06/25/2014 Elsevier Interactive Patient Education 2016 [place] Adult Low Dose Aspirin or   coated  Aspirin 81 mg daily   To reduce risk of Colon Cancer 20 %,   Skin Cancer 26 % ,   Melanoma 46%   and   Pancreatic cancer 60%   ++++++++++++++++++++++++++++++++++++++++++++++++++++++  Vitamin D goal   is between 70-100.   Please make sure that you are taking your Vitamin D as directed.   It is very important as a natural anti-inflammatory   helping hair, skin, and nails, as well as reducing stroke and heart attack risk.   It helps your bones and helps with mood.  It also decreases numerous cancer risks so  please take it as directed.   Low Vit D is associated with a 200-300% higher risk for CANCER   and 200-300% higher risk for HEART   ATTACK  &  STROKE.   .....................................Marland Kitchen  It is also associated with higher death rate at younger ages,   autoimmune diseases like Rheumatoid arthritis, Lupus, Multiple Sclerosis.     Also many other serious conditions, like depression, Alzheimer's  Dementia, infertility, muscle aches, fatigue, fibromyalgia - just to name a few.  ++++++++++++++++++++++++++++++++++++++++++++++++  Recommend the book "The  END of DIETING" by Dr Excell Seltzer   & the book "The END of DIABETES " by Dr Excell Seltzer  At [email or web address] - get book & Audio CD's     Being diabetic has a  300% increased risk for heart attack, stroke, cancer, and alzheimer- type vascular dementia. It is very important that you work harder with diet by avoiding all foods that are white. Avoid white rice (brown & wild rice is OK), white potatoes (sweetpotatoes in moderation is OK), White bread or wheat bread or anything made out of white flour like bagels, donuts, rolls, buns, biscuits, cakes, pastries, cookies, pizza crust, and pasta (made from white flour & egg whites) - vegetarian pasta or spinach or wheat pasta is OK. Multigrain breads like Arnold's or Pepperidge Farm, or multigrain sandwich thins or flatbreads.  Diet, exercise and weight loss can reverse and cure diabetes in the early stages.  Diet, exercise and weight loss is very important in the control and prevention of complications of diabetes which affects every system in your body, ie. Brain - dementia/stroke, eyes - glaucoma/blindness, heart - heart attack/heart failure, kidneys - dialysis, stomach - gastric paralysis, intestines - malabsorption, nerves - severe painful neuritis, circulation - gangrene & loss of a leg(s), and finally cancer and Alzheimers.    I recommend avoid fried & greasy foods,  sweets/candy, white rice (brown or wild rice or Quinoa is OK), white potatoes (sweet potatoes are OK) - anything made from white flour - bagels, doughnuts, rolls, buns, biscuits,white and wheat breads, pizza crust and traditional pasta made of white flour & egg white(vegetarian pasta or spinach or wheat pasta is OK).  Multi-grain bread is OK - like multi-grain flat bread or sandwich thins. Avoid alcohol in excess. Exercise is also important.    Eat all the vegetables you want - avoid meat, especially red meat and dairy - especially cheese.  Cheese is the most concentrated form of trans-fats which is  the worst thing to clog up our arteries. Veggie cheese is OK which can be found in the fresh produce section at [employer] or [employer] or [employer]  ++++++++++++++++++++++++++++++++++++++++++++++++++ DASH Eating Plan  DASH stands for "Dietary Approaches to Stop Hypertension."   The DASH eating plan is a healthy eating plan that has been shown to reduce high blood pressure (hypertension). Additional health benefits may include reducing the risk of type 2 diabetes mellitus, heart disease, and stroke. The DASH eating plan may also help with weight loss.  WHAT DO I NEED TO KNOW ABOUT THE DASH EATING PLAN? For the DASH eating plan, you will follow these general guidelines:  Choose foods with a percent daily value for sodium of less than 5% (as listed on the food label).  Use salt-free seasonings or herbs instead of table salt or sea salt.  Check with your health care provider or pharmacist before using salt substitutes.  Eat lower-sodium products, often labeled as "lower sodium" or "no salt added."  Eat fresh foods.  Eat more vegetables, fruits, and low-fat dairy products.    Choose whole grains. Look for the word "whole" as the first word in the ingredient list.  Choose fish   Limit sweets, desserts, sugars, and sugary drinks.  Choose heart-healthy fats.  Eat veggie cheese   Eat more home-cooked food and less restaurant, buffet, and fast food.  Limit fried foods.  Cook foods using methods other than frying.  Limit canned vegetables. If you do use them, rinse them well to decrease the sodium.  When eating at a restaurant, ask that your food be prepared with less salt, or no salt if possible.                      WHAT FOODS CAN I EAT?  Seek help from a dietitian for individual calorie needs.  Grains Whole grain or whole wheat bread. Brown rice. Whole grain or whole wheat pasta. Quinoa, bulgur, and whole grain cereals. Low-sodium cereals. Corn or whole wheat flour  tortillas. Whole grain cornbread. Whole grain crackers. Low-sodium crackers.  Vegetables Fresh or frozen vegetables (raw, steamed, roasted, or grilled). Low-sodium or reduced-sodium tomato and vegetable juices. Low-sodium or reduced-sodium tomato sauce and paste. Low-sodium or reduced-sodium canned vegetables.   Fruits All fresh, canned (in natural juice), or frozen fruits.  Protein Products  All fish and seafood.  Dried beans, peas, or lentils. Unsalted nuts and seeds. Unsalted canned beans.  Dairy Low-fat dairy products, such as skim or 1% milk, 2% or reduced-fat cheeses, low-fat ricotta or cottage cheese, or plain low-fat yogurt. Low-sodium or reduced-sodium cheeses.  Fats and Oils Tub margarines without trans fats. Light or reduced-fat mayonnaise and salad dressings (reduced sodium). Avocado. Safflower, olive, or canola oils. Natural peanut or almond butter.  Other Unsalted popcorn and pretzels. The items listed above may not be a complete list of recommended foods or beverages. Contact your dietitian for more options.  +++++++++++++++++++++++++++++++++++++++++++  WHAT FOODS ARE NOT RECOMMENDED?  Grains/ White flour or wheat flour  White bread. White pasta. White rice. Refined cornbread. Bagels and croissants. Crackers that contain trans fat.  Vegetables  Creamed or fried vegetables. Vegetables in a . Regular canned vegetables. Regular canned tomato sauce and paste. Regular tomato and vegetable juices.  Fruits Dried fruits. Canned fruit in light or heavy syrup. Fruit juice.  Meat and Other Protein Products Meat in general. Fatty cuts of meat. Ribs, chicken wings, bacon, sausage, bologna, salami, chitterlings, fatback, hot dogs, bratwurst, and packaged luncheon meats.  Dairy Whole or 2% milk, cream, half-and-half, and cream cheese. Whole-fat or sweetened yogurt. Full-fat cheeses or blue cheese. Nondairy creamers and whipped toppings. Processed cheese, cheese spreads, or  cheese curds.  Condiments Onion and garlic salt, seasoned salt, table salt, and sea salt. Canned and packaged gravies. Worcestershire sauce. Tartar sauce. Barbecue sauce. Teriyaki sauce. Soy sauce, including reduced sodium. Steak sauce. Fish sauce. Oyster sauce. Cocktail sauce. Horseradish. Ketchup and mustard. Meat flavorings and tenderizers. Bouillon cubes. Hot sauce. Tabasco sauce. Marinades. Taco seasonings. Relishes.  Fats and Oils Butter, stick margarine, lard, shortening, ghee, and bacon fat. Coconut, palm kernel, or palm oils. Regular salad dressings.  Pickles and olives. Salted popcorn and pretzels. The items listed above may not be a complete list of foods and beverages to avoid.

## 2015-09-15 NOTE — Progress Notes (Deleted)
  Subjective:    Patient ID: Erica Carroll, female    DOB: 10/25/1921, 81 y.o.   MRN: [id number]  HPI      Review of Systems     Objective:   Physical Exam        Assessment & Plan:

## 2015-09-16 LAB — CBC WITH DIFFERENTIAL/PLATELET
BASOS ABS: 0 10*3/uL (ref 0.0–0.1)
BASOS PCT: 0 % (ref 0–1)
EOS PCT: 3 % (ref 0–5)
Eosinophils Absolute: 0.2 10*3/uL (ref 0.0–0.7)
HEMATOCRIT: 34.6 % — AB (ref 36.0–46.0)
HEMOGLOBIN: 11.7 g/dL — AB (ref 12.0–15.0)
Lymphocytes Relative: 22 % (ref 12–46)
Lymphs Abs: 1.7 10*3/uL (ref 0.7–4.0)
MCH: 30.6 pg (ref 26.0–34.0)
MCHC: 33.8 g/dL (ref 30.0–36.0)
MCV: 90.6 fL (ref 78.0–100.0)
MONO ABS: 1.1 10*3/uL — AB (ref 0.1–1.0)
MPV: 9.5 fL (ref 8.6–12.4)
Monocytes Relative: 14 % — ABNORMAL HIGH (ref 3–12)
NEUTROS ABS: 4.8 10*3/uL (ref 1.7–7.7)
Neutrophils Relative %: 61 % (ref 43–77)
Platelets: 226 10*3/uL (ref 150–400)
RBC: 3.82 MIL/uL — ABNORMAL LOW (ref 3.87–5.11)
RDW: 13.2 % (ref 11.5–15.5)
WBC: 7.8 10*3/uL (ref 4.0–10.5)

## 2015-09-16 LAB — BASIC METABOLIC PANEL WITH GFR
BUN: 9 mg/dL (ref 7–25)
CALCIUM: 9 mg/dL (ref 8.6–10.4)
CO2: 25 mmol/L (ref 20–31)
CREATININE: 0.93 mg/dL — AB (ref 0.60–0.88)
Chloride: 101 mmol/L (ref 98–110)
GFR, Est African American: 62 mL/min (ref >=60)
GFR, Est Non African American: 54 mL/min — ABNORMAL LOW (ref >=60)
GLUCOSE: 105 mg/dL — AB (ref 65–99)
Potassium: 4.7 mmol/L (ref 3.5–5.3)
Sodium: 138 mmol/L (ref 135–146)

## 2015-09-16 LAB — HEPATIC FUNCTION PANEL
ALBUMIN: 3.8 g/dL (ref 3.6–5.1)
ALT: 10 U/L (ref 6–29)
AST: 17 U/L (ref 10–35)
Alkaline Phosphatase: 56 U/L (ref 33–130)
Bilirubin, Direct: 0.1 mg/dL (ref <=0.2)
Indirect Bilirubin: 0.3 mg/dL (ref 0.2–1.2)
TOTAL PROTEIN: 6.7 g/dL (ref 6.1–8.1)
Total Bilirubin: 0.4 mg/dL (ref 0.2–1.2)

## 2015-09-16 LAB — MAGNESIUM: MAGNESIUM: 2 mg/dL (ref 1.5–2.5)

## 2015-09-16 LAB — HEMOGLOBIN A1C
Hgb A1c MFr Bld: 5.6 % (ref <5.7)
Mean Plasma Glucose: 114 mg/dL (ref <117)

## 2015-09-16 LAB — INSULIN, RANDOM: Insulin: 9.1 u[IU]/mL (ref 2.0–19.6)

## 2015-09-16 LAB — VITAMIN D 25 HYDROXY (VIT D DEFICIENCY, FRACTURES): Vit D, 25-Hydroxy: 70 ng/mL (ref 30–100)

## 2015-09-16 LAB — TSH: TSH: 1.118 u[IU]/mL (ref 0.350–4.500)

## 2015-09-23 ENCOUNTER — Other Ambulatory Visit: Payer: Self-pay | Admitting: *Deleted

## 2015-09-23 MED ORDER — GABAPENTIN 300 MG PO CAPS: 300.0000 mg | ORAL_CAPSULE | Freq: Three times a day (TID) | ORAL | Status: DC

## 2015-09-30 ENCOUNTER — Encounter: Payer: Self-pay | Admitting: Internal Medicine

## 2015-09-30 ENCOUNTER — Ambulatory Visit (INDEPENDENT_AMBULATORY_CARE_PROVIDER_SITE_OTHER): Payer: Medicare HMO | Admitting: Internal Medicine

## 2015-09-30 VITALS — BP 122/60 | HR 60 | Temp 97.5°F | Resp 16 | Ht 63.5 in | Wt 141.0 lb

## 2015-09-30 DIAGNOSIS — B0229 Other postherpetic nervous system involvement: Secondary | ICD-10-CM

## 2015-09-30 NOTE — Progress Notes (Signed)
  Subjective:    Patient ID: Erica Carroll, female    DOB: 12/09/1921, 82 y.o.   MRN: [id number]  HPI Patient returns for 2 week f/u of R trigeminal Zoster (w/o Ramsey-Hunt) and is much improved. Pain has been minimal and now transitioned from Lyrica sx's to Gabapentin w/o break-thru pains. Rash appears to be fading with residual salmon colored areas and no crusting. Never did see Opthalmologist as recommended. Denies any visual sx's.   Medication Sig  . acetaminophen  325 MG tablet Take 325 mg by mouth every 6 (six) hours as needed for mild pain.   Marland Kitchen b complex vitamins Take 1 tablet by mouth daily.  . Cholecalciferol VITAMIN D Take 4,000 Units by mouth daily.   Marland Kitchen diltiazem  30 MG tablet Take 1 tablet (30 mg total) by mouth daily as needed. For Afib episodes  . gabapentin  300 MG  Take 1 capsule (300 mg total) by mouth 3 (three) times daily.  Marland Kitchen GLUCOSAMINE-CHONDROITIN Take 1 tablet by mouth daily.  Lebron Quam 5-325 MG  Take 1/2 to 1 tablet 3 x day if needed for pain  . MAGNESIUM OXIDE, ANTACID Take 1 tablet by mouth daily.  . Metoprolol Tartrate 75 MG  Take 75 mg by mouth 2 (two) times daily.  Marland Kitchen Preserve vision vitamin Take 1 tablet by mouth daily.   Marland Kitchen MIRALAX  Take 17 g by mouth daily as needed (constipation).  . warfarin (COUMADIN) 5 MG  Take As Directed by [facility]madin Clinic   No Known Allergies  Past Medical History  Diagnosis Date  . TIA (transient ischemic attack)     a. with fall 05/2013 -> neg head CT and MRI/MRA;  b. Plavix started.  . A-fib ([place])     a. Dx 06/2013;  b. 06/2013 Echo:  EF 55-60%, no reg wma, mild AI, mod dil LA.  . Sinus bradycardia 07/11/2013  . Hyperlipidemia   . Vitamin D deficiency   . Allergy   . GERD (gastroesophageal reflux disease)   . Urinary incontinence   . RA (rheumatoid arthritis) ([place])     " in my hands "   Review of Systems 10 point systems review negative except as above.    Objective:   Physical Exam  BP 122/60 mmHg  Pulse 60  Temp(Src)  97.5 F (36.4 C)  Resp 16  Ht 5' 3.5" (1.613 m)  Wt 141 lb (63.957 kg)  BMI 24.58 kg/m2  In no apparent distress. R facial rash is fading.   HEENT - Eac's patent. TM's Nl. EOM's full. PERRLA. NasoOroPharynx clear. Neck - supple. Nl Thyroid. Carotids 2+ & No bruits, nodes, JVD Chest - Clear equal BS w/o Rales, rhonchi, wheezes. Cor - Nl HS. RRR w/o sig MGR. No edema. MS- FROM w/o deformities. Decreased muscle power, tone and bulk. Gait Nl. Neuro -  Nl w/o focal abnormalities.    Assessment & Plan:   1. Post zoster neuralgia  - Discussed w/Patient & her son to begin taper of Gabepentin as pain sx's allow  Also discussed usual recommendations for DASH diet, exercise. - due back in May for CPE

## 2015-09-30 NOTE — Patient Instructions (Signed)
Postherpetic Neuralgia Postherpetic neuralgia (PHN) is nerve pain that occurs after a shingles infection. Shingles is a painful rash that appears on one side of the body, usually on your trunk or face. Shingles is caused by the varicella-zoster virus. This is the same virus that causes chickenpox. In people who have had chickenpox, the virus can resurface years later and cause shingles. You may have PHN if you continue to have pain for 3 months after your shingles rash has gone away. PHN appears in the same area where you had the shingles rash. For most people, PHN goes away within 1 year.  Getting a vaccination for shingles can prevent PHN. This vaccine is recommended for people older than 50. It may prevent shingles and may also lower your risk of PHN if you do get shingles. CAUSES PHN is caused by damage to your nerves from the varicella-zoster virus. This damage makes your nerves overly sensitive.  RISK FACTORS Aging is the biggest risk factor for developing PHN. Most people who get PHN are older than 16. Other risk factors include:  Having very bad pain before your shingles rash starts.  Having a very bad rash.  Having shingles in the nerve that supplies your face and eye (trigeminal nerve). SIGNS AND SYMPTOMS Pain is the main symptom of PHN. The pain is often very bad and may be described as stabbing, burning, or feeling like an electric shock. The pain may come and go or may be there all the time. Pain may be triggered by light touches on the skin or changes in temperature. You may have itching along with the pain. DIAGNOSIS  Your health care provider may diagnose PHN based on your symptoms and your history of shingles. Lab studies and other diagnostic tests are usually not needed. TREATMENT  There is no cure for PHN. Treatment for PHN will focus on pain relief. Over-the-counter pain relievers do not usually relieve PHN pain. You may need to work with a pain specialist. Treatment may  include:  Antidepressant medicines to help with pain and improve sleep.  Antiseizure medicines to relieve nerve pain.  Strong pain relievers (opioids).  A numbing patch worn on the skin (lidocaine patch). HOME CARE INSTRUCTIONS It may take a long time to recover from PHN. Work closely with your health care provider, and have a good support system at home.   Take all medicines as directed by your health care provider.  Wear loose, comfortable clothing.  Cover sensitive areas with a dressing to reduce friction from clothing rubbing on the area.  If cold does not make your pain worse, try applying a cool compress or cooling gel pack to the area.  Talk to your health care provider if you feel depressed or desperate. Living with long-term pain can be depressing.

## 2015-10-24 ENCOUNTER — Telehealth: Payer: Self-pay | Admitting: *Deleted

## 2015-10-24 ENCOUNTER — Other Ambulatory Visit: Payer: Self-pay | Admitting: *Deleted

## 2015-10-24 DIAGNOSIS — I480 Paroxysmal atrial fibrillation: Secondary | ICD-10-CM

## 2015-10-24 NOTE — Telephone Encounter (Signed)
Not seen in clinic since November 2016 Pt states has had shingles since week before Christmas and found that she had been given Prednisone taper in December and was on Valtrex for shingles Spoke with her son to explain had received request to refill her coumadin and that she will need to have her INR checked before can refill coumadin Made an appt for her to be seen next week and pt's son states that she has enough coumadin to last until visit and refill will be done at that time

## 2015-10-29 ENCOUNTER — Other Ambulatory Visit: Payer: Self-pay | Admitting: Internal Medicine

## 2015-10-29 ENCOUNTER — Ambulatory Visit (INDEPENDENT_AMBULATORY_CARE_PROVIDER_SITE_OTHER): Payer: Medicare HMO | Admitting: *Deleted

## 2015-10-29 DIAGNOSIS — Z79899 Other long term (current) drug therapy: Secondary | ICD-10-CM | POA: Diagnosis not present

## 2015-10-29 DIAGNOSIS — I480 Paroxysmal atrial fibrillation: Secondary | ICD-10-CM | POA: Diagnosis not present

## 2015-10-29 DIAGNOSIS — G459 Transient cerebral ischemic attack, unspecified: Secondary | ICD-10-CM | POA: Diagnosis not present

## 2015-10-29 DIAGNOSIS — Z7901 Long term (current) use of anticoagulants: Secondary | ICD-10-CM | POA: Diagnosis not present

## 2015-10-29 LAB — POCT INR: INR: 2.2

## 2015-10-29 MED ORDER — WARFARIN SODIUM 5 MG PO TABS: ORAL_TABLET | ORAL | Status: DC

## 2015-11-13 ENCOUNTER — Telehealth: Payer: Self-pay | Admitting: Cardiology

## 2015-11-13 ENCOUNTER — Ambulatory Visit (INDEPENDENT_AMBULATORY_CARE_PROVIDER_SITE_OTHER): Payer: Medicare HMO | Admitting: *Deleted

## 2015-11-13 DIAGNOSIS — I495 Sick sinus syndrome: Secondary | ICD-10-CM

## 2015-11-13 NOTE — Progress Notes (Signed)
Remote pacemaker transmission.   

## 2015-11-13 NOTE — Telephone Encounter (Signed)
Spoke with pt and reminded pt of remote transmission that is due today. Pt verbalized understanding.   

## 2015-11-25 ENCOUNTER — Ambulatory Visit (INDEPENDENT_AMBULATORY_CARE_PROVIDER_SITE_OTHER): Payer: Medicare HMO

## 2015-11-25 DIAGNOSIS — Z79899 Other long term (current) drug therapy: Secondary | ICD-10-CM | POA: Diagnosis not present

## 2015-11-25 DIAGNOSIS — G459 Transient cerebral ischemic attack, unspecified: Secondary | ICD-10-CM

## 2015-11-25 DIAGNOSIS — Z7901 Long term (current) use of anticoagulants: Secondary | ICD-10-CM

## 2015-11-25 DIAGNOSIS — I480 Paroxysmal atrial fibrillation: Secondary | ICD-10-CM | POA: Diagnosis not present

## 2015-11-25 LAB — POCT INR: INR: 2.9

## 2015-12-09 LAB — CUP PACEART REMOTE DEVICE CHECK
Battery Remaining Longevity: 124 mo
Battery Remaining Percentage: 95.5 %
Brady Statistic AP VS Percent: 93 %
Brady Statistic AS VS Percent: 5.8 %
Brady Statistic RV Percent Paced: 1 %
Date Time Interrogation Session: 20170216143635
Implantable Lead Implant Date: 20160518
Implantable Lead Location: 753860
Implantable Lead Model: 1948
Lead Channel Impedance Value: 560 Ohm
Lead Channel Pacing Threshold Amplitude: 0.5 V
Lead Channel Pacing Threshold Pulse Width: 0.4 ms
Lead Channel Sensing Intrinsic Amplitude: 0.6 mV
Lead Channel Setting Pacing Pulse Width: 0.4 ms
MDC IDC LEAD IMPLANT DT: 20160518
MDC IDC LEAD LOCATION: 753859
MDC IDC LEAD MODEL: 1944
MDC IDC MSMT BATTERY VOLTAGE: 3.01 V
MDC IDC MSMT LEADCHNL RV IMPEDANCE VALUE: 680 Ohm
MDC IDC MSMT LEADCHNL RV SENSING INTR AMPL: 11 mV
MDC IDC SET LEADCHNL RA PACING AMPLITUDE: 2 V
MDC IDC SET LEADCHNL RV PACING AMPLITUDE: 0.75 V
MDC IDC SET LEADCHNL RV SENSING SENSITIVITY: 2 mV
MDC IDC STAT BRADY AP VP PERCENT: 1 %
MDC IDC STAT BRADY AS VP PERCENT: 1 %
MDC IDC STAT BRADY RA PERCENT PACED: 92 %
Pulse Gen Serial Number: 7759914

## 2015-12-10 ENCOUNTER — Encounter: Payer: Self-pay | Admitting: Cardiology

## 2015-12-13 ENCOUNTER — Emergency Department (HOSPITAL_COMMUNITY)
Admission: EM | Admit: 2015-12-13 | Discharge: 2015-12-13 | Disposition: A | Discharge status: Home / Self Care | Payer: Medicare HMO | Attending: Emergency Medicine | Admitting: Emergency Medicine

## 2015-12-13 ENCOUNTER — Encounter (HOSPITAL_COMMUNITY): Payer: Self-pay

## 2015-12-13 DIAGNOSIS — Y998 Other external cause status: Secondary | ICD-10-CM | POA: Insufficient documentation

## 2015-12-13 DIAGNOSIS — Y9289 Other specified places as the place of occurrence of the external cause: Secondary | ICD-10-CM | POA: Insufficient documentation

## 2015-12-13 DIAGNOSIS — Z8673 Personal history of transient ischemic attack (TIA), and cerebral infarction without residual deficits: Secondary | ICD-10-CM | POA: Insufficient documentation

## 2015-12-13 DIAGNOSIS — X58XXXA Exposure to other specified factors, initial encounter: Secondary | ICD-10-CM | POA: Insufficient documentation

## 2015-12-13 DIAGNOSIS — H1131 Conjunctival hemorrhage, right eye: Secondary | ICD-10-CM | POA: Diagnosis not present

## 2015-12-13 DIAGNOSIS — Z7901 Long term (current) use of anticoagulants: Secondary | ICD-10-CM | POA: Diagnosis not present

## 2015-12-13 DIAGNOSIS — Y9389 Activity, other specified: Secondary | ICD-10-CM | POA: Insufficient documentation

## 2015-12-13 DIAGNOSIS — I4891 Unspecified atrial fibrillation: Secondary | ICD-10-CM | POA: Diagnosis not present

## 2015-12-13 DIAGNOSIS — E559 Vitamin D deficiency, unspecified: Secondary | ICD-10-CM | POA: Insufficient documentation

## 2015-12-13 DIAGNOSIS — E785 Hyperlipidemia, unspecified: Secondary | ICD-10-CM | POA: Insufficient documentation

## 2015-12-13 DIAGNOSIS — Z8719 Personal history of other diseases of the digestive system: Secondary | ICD-10-CM | POA: Diagnosis not present

## 2015-12-13 DIAGNOSIS — S0502XA Injury of conjunctiva and corneal abrasion without foreign body, left eye, initial encounter: Secondary | ICD-10-CM | POA: Diagnosis not present

## 2015-12-13 DIAGNOSIS — Z79899 Other long term (current) drug therapy: Secondary | ICD-10-CM | POA: Diagnosis not present

## 2015-12-13 DIAGNOSIS — S0592XA Unspecified injury of left eye and orbit, initial encounter: Secondary | ICD-10-CM | POA: Diagnosis present

## 2015-12-13 LAB — CBC WITH DIFFERENTIAL/PLATELET
Basophils Absolute: 0 10*3/uL (ref 0.0–0.1)
Basophils Relative: 0 %
EOS ABS: 0.2 10*3/uL (ref 0.0–0.7)
EOS PCT: 4 %
HCT: 33.9 % — ABNORMAL LOW (ref 36.0–46.0)
Hemoglobin: 11.4 g/dL — ABNORMAL LOW (ref 12.0–15.0)
LYMPHS ABS: 1.8 10*3/uL (ref 0.7–4.0)
LYMPHS PCT: 31 %
MCH: 30.8 pg (ref 26.0–34.0)
MCHC: 33.6 g/dL (ref 30.0–36.0)
MCV: 91.6 fL (ref 78.0–100.0)
MONO ABS: 0.6 10*3/uL (ref 0.1–1.0)
Monocytes Relative: 10 %
Neutro Abs: 3.2 10*3/uL (ref 1.7–7.7)
Neutrophils Relative %: 55 %
PLATELETS: 170 10*3/uL (ref 150–400)
RBC: 3.7 MIL/uL — AB (ref 3.87–5.11)
RDW: 13.3 % (ref 11.5–15.5)
WBC: 5.8 10*3/uL (ref 4.0–10.5)

## 2015-12-13 LAB — BASIC METABOLIC PANEL
Anion gap: 12 (ref 5–15)
BUN: 14 mg/dL (ref 6–20)
CALCIUM: 9.3 mg/dL (ref 8.9–10.3)
CO2: 24 mmol/L (ref 22–32)
CREATININE: 1.06 mg/dL — AB (ref 0.44–1.00)
Chloride: 100 mmol/L — ABNORMAL LOW (ref 101–111)
GFR calc Af Amer: 52 mL/min — ABNORMAL LOW (ref >60)
GFR, EST NON AFRICAN AMERICAN: 44 mL/min — AB (ref >60)
GLUCOSE: 126 mg/dL — AB (ref 65–99)
Potassium: 4.7 mmol/L (ref 3.5–5.1)
SODIUM: 136 mmol/L (ref 135–145)

## 2015-12-13 LAB — PROTIME-INR
INR: 2.41 — AB (ref 0.00–1.49)
Prothrombin Time: 26 seconds — ABNORMAL HIGH (ref 11.6–15.2)

## 2015-12-13 MED ORDER — FLUORESCEIN SODIUM 1 MG OP STRP
1.0000 | ORAL_STRIP | Freq: Once | OPHTHALMIC | Status: AC
  Administered 2015-12-13: 1 via OPHTHALMIC
  Filled 2015-12-13: qty 1

## 2015-12-13 MED ORDER — TETRACAINE HCL 0.5 % OP SOLN: 1.0000 [drp] | Freq: Once | OPHTHALMIC | Status: DC

## 2015-12-13 MED ORDER — POLYMYXIN B-TRIMETHOPRIM 10000-0.1 UNIT/ML-% OP SOLN: 1.0000 [drp] | OPHTHALMIC | Status: AC

## 2015-12-13 MED ORDER — TETANUS-DIPHTH-ACELL PERTUSSIS 5-2.5-18.5 LF-MCG/0.5 IM SUSP
0.5000 mL | Freq: Once | INTRAMUSCULAR | Status: AC
  Administered 2015-12-13: 0.5 mL via INTRAMUSCULAR
  Filled 2015-12-13: qty 0.5

## 2015-12-13 MED ORDER — TETRACAINE HCL 0.5 % OP SOLN
1.0000 [drp] | Freq: Once | OPHTHALMIC | Status: AC
  Administered 2015-12-13: 1 [drp] via OPHTHALMIC
  Filled 2015-12-13: qty 2

## 2015-12-13 NOTE — ED Notes (Signed)
Pt reports onset yesterday at 6:30p blood noted in right eye.  No blurred vision.  Pt took Coumadin yesterday.  No trauma noted to eye.

## 2015-12-13 NOTE — ED Provider Notes (Signed)
CSN: [id number]     Arrival date & time 12/13/15  1208 History   First MD Initiated Contact with Patient 12/13/15 1405     Chief Complaint  Patient presents with  . Eye Problem     (Consider location/radiation/quality/duration/timing/severity/associated sxs/prior Treatment) HPI Comments: Patient with redness to sclerae right eye since 6:30 PM yesterday. This was noticed by a relative. She denies any trauma. She denies any change in her vision. She denies any eye pain. She takes Coumadin for history of atrial fibrillation last dose yesterday. Denies any headache, chest pain, shortness of breath. She normally wears glasses. Denies any other symptoms. No focal weakness, numbness or tingling. History of lens replacement bilaterally.  Patient is a 82 y.o. female presenting with eye problem. The history is provided by the patient and a relative.  Eye Problem Associated symptoms: redness   Associated symptoms: no headaches, no nausea, no photophobia, no vomiting and no weakness     Past Medical History  Diagnosis Date  . TIA (transient ischemic attack)     a. with fall 05/2013 -> neg head CT and MRI/MRA;  b. Plavix started.  . A-fib ([place])     a. Dx 06/2013;  b. 06/2013 Echo:  EF 55-60%, no reg wma, mild AI, mod dil LA.  . Sinus bradycardia 07/11/2013  . Hyperlipidemia   . Vitamin D deficiency   . Allergy   . GERD (gastroesophageal reflux disease)   . Urinary incontinence   . RA (rheumatoid arthritis) ([place])     " in my hands "   Past Surgical History  Procedure Laterality Date  . Eye surgery Right 1994    CE/IOL  . Eye surgery Left 1998    CE/IOL  . Appendectomy  1936  . Ep implantable device N/A 02/12/2015    Procedure: Pacemaker Implant;  Surgeon: Deboraha Sprang, MD;  Location: [facility];  Service: Cardiovascular;  Laterality: N/A;   Family History  Problem Relation Age of Onset  . Heart disease Mother   . Heart disease Sister   . Heart disease Brother   . Heart  disease Brother   . CVA Sister   . Thyroid disease Sister    Social History  Substance Use Topics  . Smoking status: Never Smoker   . Smokeless tobacco: Never Used  . Alcohol Use: 0.0 oz/week    0 Standard drinks or equivalent per week     Comment: ocassional-rarely   OB History    No data available     Review of Systems  Constitutional: Negative for activity change and appetite change.  Eyes: Positive for redness. Negative for photophobia, pain and visual disturbance.  Respiratory: Negative for apnea, cough and shortness of breath.   Cardiovascular: Negative for chest pain.  Gastrointestinal: Negative for nausea, vomiting and abdominal pain.  Endocrine: Negative for polyphagia and polyuria.  Genitourinary: Negative for dysuria, hematuria, vaginal bleeding and vaginal discharge.  Musculoskeletal: Negative for myalgias and arthralgias.  Skin: Negative for rash.  Neurological: Negative for dizziness, weakness and headaches.  A complete 10 system review of systems was obtained and all systems are negative except as noted in the HPI and PMH.      Allergies  Other  Home Medications   Prior to Admission medications   Medication Sig Start Date End Date Taking? Authorizing Provider  acetaminophen (TYLENOL) 325 MG tablet Take 325 mg by mouth every 6 (six) hours as needed for mild pain.    Yes Historical Provider,  MD  b complex vitamins tablet Take 1 tablet by mouth daily.   Yes Historical Provider, MD  Cholecalciferol (VITAMIN D PO) Take 3,000 Units by mouth daily.    Yes Historical Provider, MD  GLUCOSAMINE-CHONDROITIN PO Take 1 tablet by mouth daily.   Yes Historical Provider, MD  MAGNESIUM OXIDE, ANTACID, PO Take 1 tablet by mouth daily.   Yes Historical Provider, MD  Metoprolol Tartrate 75 MG TABS Take 75 mg by mouth 2 (two) times daily. 08/19/15  Yes Deboraha Sprang, MD  OVER THE COUNTER MEDICATION Take 1 tablet by mouth daily. Preserve vision vitamin   Yes Historical  Provider, MD  polyethylene glycol (MIRALAX / GLYCOLAX) packet Take 17 g by mouth daily as needed (constipation).   Yes Historical Provider, MD  warfarin (COUMADIN) 5 MG tablet Take As Directed by [facility] Patient taking differently: Take 5-7.5 mg by mouth daily. Take 1 tablet all days except Tuesday & Saturday take 1.5 tablets 10/29/15  Yes [name], MD  diltiazem (CARDIZEM) 30 MG tablet Take 1 tablet (30 mg total) by mouth daily as needed. For Afib episodes 05/15/15   Deboraha Sprang, MD  FLUZONE HIGH-DOSE 0.5 ML SUSY TO BE ADMINISTERED BY PHARMACIST FOR IMMUNIZATION 09/05/15   Historical Provider, MD  gabapentin (NEURONTIN) 300 MG capsule Take 1 capsule (300 mg total) by mouth 3 (three) times daily. Patient not taking: Reported on 12/13/2015 09/23/15   Unk Pinto, MD  HYDROcodone-acetaminophen [facility]) 5-325 MG tablet Take 1/2 to 1 tablet 3 x day if needed for pain Patient not taking: Reported on 12/13/2015 09/15/15   Unk Pinto, MD  trimethoprim-polymyxin b [facility]) ophthalmic solution Place 1 drop into the right eye every 4 (four) hours. 12/13/15 12/20/15  Ezequiel Essex, MD   BP 141/62 mmHg  Pulse 59  Temp(Src) 97.9 F (36.6 C) (Oral)  Resp 16  Ht 5\' 4"  (1.626 m)  Wt 142 lb 3 oz (64.496 kg)  BMI 24.39 kg/m2  SpO2 100% Physical Exam  Constitutional: She is oriented to person, place, and time. She appears well-developed and well-nourished. No distress.  HENT:  Head: Normocephalic and atraumatic.  Mouth/Throat: Oropharynx is clear and moist. No oropharyngeal exudate.  Eyes: Conjunctivae and EOM are normal. Pupils are equal, round, and reactive to light.  R subconjunctival hemorrhage.  No hyphema. No pain with EOMI. IOP 18 Small area of fluoroscein uptake inferiorly  Neck: Normal range of motion. Neck supple.  No meningismus.  Cardiovascular: Normal rate, normal heart sounds and intact distal pulses.   No murmur heard. Irregular rhythm  Pulmonary/Chest: Effort  normal and breath sounds normal. No respiratory distress.  Abdominal: Soft. There is no tenderness. There is no rebound and no guarding.  Musculoskeletal: Normal range of motion. She exhibits no edema or tenderness.  Neurological: She is alert and oriented to person, place, and time. No cranial nerve deficit. She exhibits normal muscle tone. Coordination normal.  No ataxia on finger to nose bilaterally. No pronator drift. 5/5 strength throughout. CN 2-12 intact.Equal grip strength. Sensation intact.   Skin: Skin is warm.  Psychiatric: She has a normal mood and affect. Her behavior is normal.  Nursing note and vitals reviewed.   ED Course  Procedures (including critical care time) Labs Review Labs Reviewed  PROTIME-INR - Abnormal; Notable for the following:    Prothrombin Time 26.0 (*)    INR 2.41 (*)    All other components within normal limits  CBC WITH DIFFERENTIAL/PLATELET - Abnormal; Notable for the  following:    RBC 3.70 (*)    Hemoglobin 11.4 (*)    HCT 33.9 (*)    All other components within normal limits  BASIC METABOLIC PANEL - Abnormal; Notable for the following:    Chloride 100 (*)    Glucose, Bld 126 (*)    Creatinine, Ser 1.06 (*)    GFR calc non Af Amer 44 (*)    GFR calc Af Amer 52 (*)    All other components within normal limits    Imaging Review No results found. I have personally reviewed and evaluated these images and lab results as part of my medical decision-making.   EKG Interpretation None      MDM   Final diagnoses:  Subconjunctival hemorrhage, right  Corneal abrasion, left, initial encounter   Atraumatic right subconjunctival hemorrhage. No visual change. INR 2.4. No evidence of hyphema. No hyphema on slit lamp exam.  Visual acuity as documented.  Reassurance for subconjunctival hemorrhage. No hyphema.  Treat for small corneal abrasion as well. Update tetanus.  dw Dr. Katy Fitch who states no need to hold coumadin.  Can follow up with PCP or  his office. Return precautions discussed.      Ezequiel Essex, MD 12/13/15 [phone]

## 2015-12-13 NOTE — Discharge Instructions (Signed)
Subconjunctival Hemorrhage Follow up with your doctor this week. Return to the ED if you develop eye pain, visual change or any other concerns. Subconjunctival hemorrhage is bleeding that happens between the white part of your eye (sclera) and the clear membrane that covers the outside of your eye (conjunctiva). There are many tiny blood vessels near the surface of your eye. A subconjunctival hemorrhage happens when one or more of these vessels breaks and bleeds, causing a red patch to appear on your eye. This is similar to a bruise. Depending on the amount of bleeding, the red patch may only cover a small area of your eye or it may cover the entire visible part of the sclera. If a lot of blood collects under the conjunctiva, there may also be swelling. Subconjunctival hemorrhages do not affect your vision or cause pain, but your eye may feel irritated if there is swelling. Subconjunctival hemorrhages usually do not require treatment, and they disappear on their own within two weeks. CAUSES This condition may be caused by:  Mild trauma, such as rubbing your eye too hard.  Severe trauma or blunt injuries.  Coughing, sneezing, or vomiting.  Straining, such as when lifting a heavy object.  High blood pressure.  Recent eye surgery.  A history of diabetes.  Certain medicines, especially blood thinners (anticoagulants).  Other conditions, such as eye tumors, bleeding disorders, or blood vessel abnormalities. Subconjunctival hemorrhages can happen without an obvious cause.  SYMPTOMS  Symptoms of this condition include:  A bright red or dark red patch on the white part of the eye.  The red area may spread out to cover a larger area of the eye before it goes away.  The red area may turn brownish-yellow before it goes away.  Swelling.  Mild eye irritation. DIAGNOSIS This condition is diagnosed with a physical exam. If your subconjunctival hemorrhage was caused by trauma, your health care  provider may refer you to an eye specialist (ophthalmologist) or another specialist to check for other injuries. You may have other tests, including:  An eye exam.  A blood pressure check.  Blood tests to check for bleeding disorders. If your subconjunctival hemorrhage was caused by trauma, X-rays or a CT scan may be done to check for other injuries. TREATMENT Usually, no treatment is needed. Your health care provider may recommend eye drops or cold compresses to help with discomfort. HOME CARE INSTRUCTIONS  Take over-the-counter and prescription medicines only as directed by your health care provider.  Use eye drops or cold compresses to help with discomfort as directed by your health care provider.  Avoid activities, things, and environments that may irritate or injure your eye.  Keep all follow-up visits as told by your health care provider. This is important. SEEK MEDICAL CARE IF:  You have pain in your eye.  The bleeding does not go away within 3 weeks.  You keep getting new subconjunctival hemorrhages. SEEK IMMEDIATE MEDICAL CARE IF:  Your vision changes or you have difficulty seeing.  You suddenly develop severe sensitivity to light.  You develop a severe headache, persistent vomiting, confusion, or abnormal tiredness (lethargy).  Your eye seems to bulge or protrude from your eye socket.  You develop unexplained bruises on your body.  You have unexplained bleeding in another area of your body.   This information is not intended to replace advice given to you by your health care provider. Make sure you discuss any questions you have with your health care provider.  Document Released: 09/13/2005 Document Revised: 06/04/2015 Document Reviewed: 11/20/2014 Elsevier Interactive Patient Education [employer].

## 2015-12-17 ENCOUNTER — Other Ambulatory Visit: Payer: Self-pay | Admitting: *Deleted

## 2015-12-17 DIAGNOSIS — I480 Paroxysmal atrial fibrillation: Secondary | ICD-10-CM

## 2015-12-17 MED ORDER — WARFARIN SODIUM 5 MG PO TABS: ORAL_TABLET | ORAL | Status: DC

## 2016-01-15 ENCOUNTER — Other Ambulatory Visit: Payer: Self-pay | Admitting: *Deleted

## 2016-01-15 MED ORDER — METOPROLOL TARTRATE 75 MG PO TABS: 75.0000 mg | ORAL_TABLET | Freq: Two times a day (BID) | ORAL | Status: DC

## 2016-01-19 ENCOUNTER — Other Ambulatory Visit: Payer: Self-pay

## 2016-01-19 DIAGNOSIS — I480 Paroxysmal atrial fibrillation: Secondary | ICD-10-CM

## 2016-01-20 ENCOUNTER — Ambulatory Visit (INDEPENDENT_AMBULATORY_CARE_PROVIDER_SITE_OTHER): Payer: Medicare HMO | Admitting: *Deleted

## 2016-01-20 DIAGNOSIS — Z79899 Other long term (current) drug therapy: Secondary | ICD-10-CM

## 2016-01-20 DIAGNOSIS — Z7901 Long term (current) use of anticoagulants: Secondary | ICD-10-CM | POA: Diagnosis not present

## 2016-01-20 DIAGNOSIS — I480 Paroxysmal atrial fibrillation: Secondary | ICD-10-CM | POA: Diagnosis not present

## 2016-01-20 DIAGNOSIS — G459 Transient cerebral ischemic attack, unspecified: Secondary | ICD-10-CM

## 2016-01-20 LAB — POCT INR: INR: 2.6

## 2016-01-20 MED ORDER — WARFARIN SODIUM 5 MG PO TABS: ORAL_TABLET | ORAL | Status: DC

## 2016-02-03 ENCOUNTER — Encounter: Payer: Self-pay | Admitting: Internal Medicine

## 2016-02-03 ENCOUNTER — Ambulatory Visit (INDEPENDENT_AMBULATORY_CARE_PROVIDER_SITE_OTHER): Payer: Medicare HMO | Admitting: Internal Medicine

## 2016-02-03 VITALS — BP 140/66 | HR 68 | Temp 97.3°F | Resp 16 | Ht 63.5 in | Wt 142.8 lb

## 2016-02-03 DIAGNOSIS — Z0001 Encounter for general adult medical examination with abnormal findings: Secondary | ICD-10-CM | POA: Diagnosis not present

## 2016-02-03 DIAGNOSIS — Z79899 Other long term (current) drug therapy: Secondary | ICD-10-CM | POA: Diagnosis not present

## 2016-02-03 DIAGNOSIS — I10 Essential (primary) hypertension: Secondary | ICD-10-CM

## 2016-02-03 DIAGNOSIS — E785 Hyperlipidemia, unspecified: Secondary | ICD-10-CM

## 2016-02-03 DIAGNOSIS — E559 Vitamin D deficiency, unspecified: Secondary | ICD-10-CM

## 2016-02-03 DIAGNOSIS — Z136 Encounter for screening for cardiovascular disorders: Secondary | ICD-10-CM

## 2016-02-03 DIAGNOSIS — Z0000 Encounter for general adult medical examination without abnormal findings: Secondary | ICD-10-CM | POA: Diagnosis not present

## 2016-02-03 DIAGNOSIS — I480 Paroxysmal atrial fibrillation: Secondary | ICD-10-CM

## 2016-02-03 DIAGNOSIS — R7303 Prediabetes: Secondary | ICD-10-CM | POA: Diagnosis not present

## 2016-02-03 DIAGNOSIS — R7309 Other abnormal glucose: Secondary | ICD-10-CM | POA: Diagnosis not present

## 2016-02-03 DIAGNOSIS — R6889 Other general symptoms and signs: Secondary | ICD-10-CM | POA: Diagnosis not present

## 2016-02-03 DIAGNOSIS — Z1212 Encounter for screening for malignant neoplasm of rectum: Secondary | ICD-10-CM

## 2016-02-03 LAB — CBC WITH DIFFERENTIAL/PLATELET
Basophils Absolute: 65 cells/uL (ref 0–200)
Basophils Relative: 1 %
EOS ABS: 260 {cells}/uL (ref 15–500)
Eosinophils Relative: 4 %
HEMATOCRIT: 36.7 % (ref 35.0–45.0)
Hemoglobin: 12.5 g/dL (ref 11.7–15.5)
LYMPHS PCT: 33 %
Lymphs Abs: 2145 cells/uL (ref 850–3900)
MCH: 30.4 pg (ref 27.0–33.0)
MCHC: 34.1 g/dL (ref 32.0–36.0)
MCV: 89.3 fL (ref 80.0–100.0)
MONO ABS: 650 {cells}/uL (ref 200–950)
MPV: 10 fL (ref 7.5–12.5)
Monocytes Relative: 10 %
NEUTROS PCT: 52 %
Neutro Abs: 3380 cells/uL (ref 1500–7800)
Platelets: 195 10*3/uL (ref 140–400)
RBC: 4.11 MIL/uL (ref 3.80–5.10)
RDW: 13.6 % (ref 11.0–15.0)
WBC: 6.5 10*3/uL (ref 3.8–10.8)

## 2016-02-03 LAB — BASIC METABOLIC PANEL WITH GFR
BUN: 19 mg/dL (ref 7–25)
CALCIUM: 8.9 mg/dL (ref 8.6–10.4)
CO2: 24 mmol/L (ref 20–31)
CREATININE: 1.25 mg/dL — AB (ref 0.60–0.88)
Chloride: 100 mmol/L (ref 98–110)
GFR, EST AFRICAN AMERICAN: 43 mL/min — AB (ref >=60)
GFR, EST NON AFRICAN AMERICAN: 38 mL/min — AB (ref >=60)
GLUCOSE: 90 mg/dL (ref 65–99)
Potassium: 4.2 mmol/L (ref 3.5–5.3)
Sodium: 135 mmol/L (ref 135–146)

## 2016-02-03 LAB — LIPID PANEL
CHOLESTEROL: 147 mg/dL (ref 125–200)
HDL: 45 mg/dL — ABNORMAL LOW (ref >=46)
LDL Cholesterol: 47 mg/dL (ref <130)
TRIGLYCERIDES: 273 mg/dL — AB (ref <150)
Total CHOL/HDL Ratio: 3.3 Ratio (ref <=5.0)
VLDL: 55 mg/dL — ABNORMAL HIGH (ref <30)

## 2016-02-03 LAB — HEPATIC FUNCTION PANEL
ALBUMIN: 4.3 g/dL (ref 3.6–5.1)
ALT: 11 U/L (ref 6–29)
AST: 20 U/L (ref 10–35)
Alkaline Phosphatase: 50 U/L (ref 33–130)
Bilirubin, Direct: 0.1 mg/dL (ref <=0.2)
Indirect Bilirubin: 0.3 mg/dL (ref 0.2–1.2)
TOTAL PROTEIN: 6.7 g/dL (ref 6.1–8.1)
Total Bilirubin: 0.4 mg/dL (ref 0.2–1.2)

## 2016-02-03 LAB — TSH: TSH: 0.84 mIU/L

## 2016-02-03 LAB — HEMOGLOBIN A1C
Hgb A1c MFr Bld: 5.4 % (ref <5.7)
MEAN PLASMA GLUCOSE: 108 mg/dL

## 2016-02-03 LAB — MAGNESIUM: MAGNESIUM: 2.1 mg/dL (ref 1.5–2.5)

## 2016-02-03 NOTE — Progress Notes (Signed)
Patient ID: Erica Carroll, female   DOB: 1922-08-20, 82 y.o.   MRN: [id number]   [facility]                    Unk Pinto, M.D.          Uvaldo Bristle. Silverio Lay, P.A.-C           Starlyn Skeans, P.A.-C   [facility]                [address] [place] [id number] Telephone [phone] Telefax [phone]  __________________________________________________________________________  Annual Screening/Preventative Visit And Comprehensive Evaluation &  Examination     This delightful  82 y.o. [facility] presents for a Wellness/Preventative Visit & comprehensive evaluation and management of multiple medical co-morbidities.  Patient has been followed for HTN, Prediabetes, Hyperlipidemia and Vitamin D Deficiency.      HTN predates many years. Patient's BP has been controlled at home and patient denies any cardiac symptoms as chest pain, palpitations, shortness of breath, dizziness or ankle swelling. Today's BP: 140/66 mmHg      Patient's hyperlipidemia is controlled with diet and medications. Patient denies myalgias or other medication SE's. Last lipids were at goal with Cholesterol 129; HDL 46; LDL 60; Triglycerides 117 on 04/22/2015.     Patient has prediabetes predating since 2012 with A1c 5.7% and patient denies reactive hypoglycemic symptoms, visual blurring, diabetic polys, or paresthesias. Last A1c was back to normal range 5.6% on 09/15/2015.     Finally, patient has history of Vitamin D Deficiency of "18" in 2010 and last Vitamin D was  70 on 09/15/2015.    Medication Sig  . acetaminophen 325 MG tablet Take 325 mg by mouth every 6 (six) hours as needed for mild pain.   Marland Kitchen b complex vitamins tablet Take 1 tablet by mouth daily.  Marland Kitchen VITAMIN D  Take 3,000 Units by mouth daily.   Marland Kitchen diltiazem  30 MG tablet Take 1 tablet (30 mg total) by mouth daily as needed. For Afib episodes  . GLUCOSAMINE-CHONDROITIN   Take 1 tablet by mouth daily.  Marland Kitchen MAGNESIUM OXIDE Take 1 tablet by mouth daily.  . Metoprolol Tartrate 75 MG TABS Take 75 mg by mouth 2 (two) times daily.  Marland Kitchen Preserve vision vitamin Take 1 tablet by mouth daily.   Marland Kitchen MIRALAX  Take 17 g by mouth daily as needed (constipation).  . warfarin 5 MG tablet Take As Directed by Coumadin Clinic   Allergies  Allergen Reactions  . Other Other (See Comments)    News Print: (freshly printed newspapers) cause her eyes to water . Allergy type reaction. Medal: itching & rash   Past Medical History  Diagnosis Date  . TIA (transient ischemic attack)     a. with fall 05/2013 -> neg head CT and MRI/MRA;  b. Plavix started.  . A-fib ([place])     a. Dx 06/2013;  b. 06/2013 Echo:  EF 55-60%, no reg wma, mild AI, mod dil LA.  . Sinus bradycardia 07/11/2013  . Hyperlipidemia   . Vitamin D deficiency   . Allergy   . GERD (gastroesophageal reflux disease)   . Urinary incontinence   . RA (rheumatoid arthritis) ([place])     " in my hands "   Health Maintenance  Topic Date Due  . ZOSTAVAX  08/02/1984  . DEXA  SCAN  08/02/1989  . PNA vac Low Risk Adult (2 of 2 - PCV13) 11/25/2009  . INFLUENZA VACCINE  04/27/2016  . TETANUS/TDAP  12/12/2025   Immunization History  Administered Date(s) Administered  . Influenza, High Dose Seasonal PF 07/18/2014  . Influenza,inj,Quad PF,36+ Mos 07/26/2013  . Influenza-Unspecified 05/28/2012  . Pneumococcal-Unspecified 11/25/2008  . Td 09/13/2006  . Tdap 12/13/2015   Past Surgical History  Procedure Laterality Date  . Eye surgery Right 1994    CE/IOL  . Eye surgery Left 1998    CE/IOL  . Appendectomy  1936  . Ep implantable device N/A 02/12/2015    Procedure: Pacemaker Implant;  Surgeon: Deboraha Sprang, MD;  Location: [place] [facility];  Service: Cardiovascular;  Laterality: N/A;   Family History  Problem Relation Age of Onset  . Heart disease Mother   . Heart disease Sister   . Heart disease Brother   . Heart disease  Brother   . CVA Sister   . Thyroid disease Sister    Social History  Substance Use Topics  . Smoking status: Never Smoker   . Smokeless tobacco: Never Used  . Alcohol Use: 0.0 oz/week    0 Standard drinks or equivalent per week     Comment: ocassional-rarely    ROS Constitutional: Denies fever, chills, weight loss/gain, headaches, insomnia,  night sweats, and change in appetite. Does c/o fatigue. Eyes: Denies redness, blurred vision, diplopia, discharge, itchy, watery eyes.  ENT: Denies discharge, congestion, post nasal drip, epistaxis, sore throat, earache, hearing loss, dental pain, Tinnitus, Vertigo, Sinus pain, snoring.  Cardio: Denies chest pain, palpitations, irregular heartbeat, syncope, dyspnea, diaphoresis, orthopnea, PND, claudication, edema Respiratory: denies cough, dyspnea, DOE, pleurisy, hoarseness, laryngitis, wheezing.  Gastrointestinal: Denies dysphagia, heartburn, reflux, water brash, pain, cramps, nausea, vomiting, bloating, diarrhea, constipation, hematemesis, melena, hematochezia, jaundice, hemorrhoids Genitourinary: Denies dysuria, frequency, urgency, nocturia, hesitancy, discharge, hematuria, flank pain Breast: Breast lumps, nipple discharge, bleeding.  Musculoskeletal: Denies arthralgia, myalgia, stiffness, Jt. Swelling, pain, limp, and strain/sprain. Denies falls. Skin: Denies puritis, rash, hives, warts, acne, eczema, changing in skin lesion Neuro: No weakness, tremor, incoordination, spasms, paresthesia, pain Psychiatric: Denies confusion, memory loss, sensory loss. Denies Depression. Endocrine: Denies change in weight, skin, hair change, nocturia, and paresthesia, diabetic polys, visual blurring, hyper / hypo glycemic episodes.  Heme/Lymph: No excessive bleeding, bruising, enlarged lymph nodes.  Physical Exam  BP 140/66 mmHg  Pulse 68  Temp(Src) 97.3 F (36.3 C)  Resp 16  Ht 5' 3.5" (1.613 m)  Wt 142 lb 12.8 oz (64.774 kg)  BMI 24.90 kg/m2  General  Appearance: Well nourished and in no apparent distress. Eyes: PERRLA, EOMs, conjunctiva no swelling or erythema, normal fundi and vessels. Sinuses: No frontal/maxillary tenderness ENT/Mouth: EACs patent / TMs  nl. Nares clear without erythema, swelling, mucoid exudates. Oral hygiene is good. No erythema, swelling, or exudate. Tongue normal, non-obstructing. Tonsils not swollen or erythematous. Hearing normal.  Neck: Supple, thyroid normal. No bruits, nodes or JVD. Respiratory: Respiratory effort normal.  BS equal and clear bilateral without rales, rhonci, wheezing or stridor. Cardio: Heart sounds are normal with regular rate and rhythm and no murmurs, rubs or gallops. Peripheral pulses are normal and equal bilaterally without edema. No aortic or femoral bruits. Chest: symmetric with normal excursions and percussion. Breasts: Symmetric, without lumps, nipple discharge, retractions, or fibrocystic changes.  Abdomen: Flat, soft with bowel sounds active. Nontender, no guarding, rebound, hernias, masses, or organomegaly.  Lymphatics: Non tender without lymphadenopathy.  Musculoskeletal: Full  ROM all peripheral extremities, joint stability, 5/5 strength, and normal gait. Skin: Warm and dry without rashes, lesions, cyanosis, clubbing or  ecchymosis.  Neuro: Cranial nerves intact, reflexes equal bilaterally. Normal muscle tone, no cerebellar symptoms. Sensation intact.  Pysch: Alert and oriented X 3, normal affect, Insight and Judgment appropriate.   Assessment and Plan  1. Annual Preventative Screening Examination  - Microalbumin / creatinine urine ratio - EKG 12-Lead - [place], RETROPERITNL ABD,  LTD - POC Hemoccult Bld/Stl  - Urinalysis, Routine w reflex microscopic  - CBC with Differential/Platelet - BASIC METABOLIC PANEL WITH GFR - Hepatic function panel - Magnesium - Lipid panel - TSH - Hemoglobin A1c - Insulin, random - VITAMIN D 25 Hydroxy   2. Essential hypertension  - Microalbumin /  creatinine urine ratio - EKG 12-Lead - [place], RETROPERITNL ABD,  LTD - TSH  3. Hyperlipidemia  - Lipid panel - TSH  4. Prediabetes  - Hemoglobin A1c - Insulin, random  5. Vitamin D deficiency  - VITAMIN D 25 Hydroxy   6. Paroxysmal atrial fibrillation (HCC)   7. Screening for rectal cancer  - POC Hemoccult Bld/Stl   8. Medication management  - Urinalysis, Routine w reflex microscopic  - CBC with Differential/Platelet - BASIC METABOLIC PANEL WITH GFR - Hepatic function panel - Magnesium  9. Screening for AAA (aortic abdominal aneurysm)   10. Screening for ischemic heart disease   Continue prudent diet as discussed, weight control, BP monitoring, regular exercise, and medications. Discussed med's effects and SE's. Screening labs and tests as requested with regular follow-up as recommended. Over 40 minutes of exam, counseling, chart review and high complex critical decision making was performed.

## 2016-02-03 NOTE — Patient Instructions (Signed)

## 2016-02-04 LAB — URINALYSIS, ROUTINE W REFLEX MICROSCOPIC
Bilirubin Urine: NEGATIVE
Glucose, UA: NEGATIVE
Hgb urine dipstick: NEGATIVE
Ketones, ur: NEGATIVE
Nitrite: NEGATIVE
Protein, ur: NEGATIVE
Specific Gravity, Urine: 1.019 (ref 1.001–1.035)
pH: 6.5 (ref 5.0–8.0)

## 2016-02-04 LAB — URINALYSIS, MICROSCOPIC ONLY
Bacteria, UA: NONE SEEN [HPF]
Casts: NONE SEEN [LPF]
Crystals: NONE SEEN [HPF]
YEAST: NONE SEEN [HPF]

## 2016-02-04 LAB — MICROALBUMIN / CREATININE URINE RATIO
CREATININE, URINE: 101 mg/dL (ref 20–320)
MICROALB/CREAT RATIO: 8 ug/mg{creat} (ref <30)
Microalb, Ur: 0.8 mg/dL

## 2016-02-04 LAB — VITAMIN D 25 HYDROXY (VIT D DEFICIENCY, FRACTURES): Vit D, 25-Hydroxy: 51 ng/mL (ref 30–100)

## 2016-02-04 LAB — INSULIN, RANDOM: INSULIN: 12.8 u[IU]/mL (ref 2.0–19.6)

## 2016-02-12 ENCOUNTER — Ambulatory Visit (INDEPENDENT_AMBULATORY_CARE_PROVIDER_SITE_OTHER): Payer: Medicare HMO | Admitting: *Deleted

## 2016-02-12 DIAGNOSIS — I495 Sick sinus syndrome: Secondary | ICD-10-CM | POA: Diagnosis not present

## 2016-02-13 NOTE — Progress Notes (Signed)
Remote pacemaker transmission.   

## 2016-03-01 ENCOUNTER — Other Ambulatory Visit: Payer: Self-pay | Admitting: *Deleted

## 2016-03-01 DIAGNOSIS — Z0001 Encounter for general adult medical examination with abnormal findings: Secondary | ICD-10-CM

## 2016-03-01 DIAGNOSIS — Z1212 Encounter for screening for malignant neoplasm of rectum: Secondary | ICD-10-CM

## 2016-03-01 LAB — POC HEMOCCULT BLD/STL (HOME/3-CARD/SCREEN)
FECAL OCCULT BLD: NEGATIVE
FECAL OCCULT BLD: NEGATIVE
FECAL OCCULT BLD: NEGATIVE

## 2016-03-02 ENCOUNTER — Ambulatory Visit (INDEPENDENT_AMBULATORY_CARE_PROVIDER_SITE_OTHER): Payer: Medicare HMO

## 2016-03-02 DIAGNOSIS — G459 Transient cerebral ischemic attack, unspecified: Secondary | ICD-10-CM | POA: Diagnosis not present

## 2016-03-02 DIAGNOSIS — Z79899 Other long term (current) drug therapy: Secondary | ICD-10-CM | POA: Diagnosis not present

## 2016-03-02 DIAGNOSIS — I480 Paroxysmal atrial fibrillation: Secondary | ICD-10-CM | POA: Diagnosis not present

## 2016-03-02 DIAGNOSIS — Z7901 Long term (current) use of anticoagulants: Secondary | ICD-10-CM

## 2016-03-02 LAB — POCT INR: INR: 2.6

## 2016-03-03 ENCOUNTER — Encounter: Payer: Self-pay | Admitting: Cardiology

## 2016-03-05 LAB — CUP PACEART REMOTE DEVICE CHECK
Battery Remaining Longevity: 120 mo
Brady Statistic AP VS Percent: 94 %
Brady Statistic AS VP Percent: 1 %
Brady Statistic AS VS Percent: 5 %
Brady Statistic RV Percent Paced: 1 %
Date Time Interrogation Session: 20170518061458
Implantable Lead Location: 753860
Implantable Lead Model: 1948
Lead Channel Impedance Value: 540 Ohm
Lead Channel Pacing Threshold Amplitude: 0.625 V
Lead Channel Pacing Threshold Pulse Width: 0.4 ms
Lead Channel Setting Pacing Amplitude: 2 V
Lead Channel Setting Sensing Sensitivity: 2 mV
MDC IDC LEAD IMPLANT DT: 20160518
MDC IDC LEAD IMPLANT DT: 20160518
MDC IDC LEAD LOCATION: 753859
MDC IDC LEAD MODEL: 1944
MDC IDC MSMT BATTERY REMAINING PERCENTAGE: 95.5 %
MDC IDC MSMT BATTERY VOLTAGE: 3.01 V
MDC IDC MSMT LEADCHNL RA SENSING INTR AMPL: 0.5 mV
MDC IDC MSMT LEADCHNL RV IMPEDANCE VALUE: 640 Ohm
MDC IDC MSMT LEADCHNL RV SENSING INTR AMPL: 7.8 mV
MDC IDC PG SERIAL: 7759914
MDC IDC SET LEADCHNL RV PACING AMPLITUDE: 0.875
MDC IDC SET LEADCHNL RV PACING PULSEWIDTH: 0.4 ms
MDC IDC STAT BRADY AP VP PERCENT: 1 %
MDC IDC STAT BRADY RA PERCENT PACED: 93 %

## 2016-03-19 ENCOUNTER — Encounter: Payer: Self-pay | Admitting: Cardiology

## 2016-03-25 ENCOUNTER — Other Ambulatory Visit: Payer: Self-pay | Admitting: Pharmacist Clinician (PhC)/ Clinical Pharmacy Specialist

## 2016-03-25 DIAGNOSIS — I480 Paroxysmal atrial fibrillation: Secondary | ICD-10-CM

## 2016-03-25 IMAGING — CR DG CHEST 2V
2 series · 2 of 2 positions shown · non-contrast
Comparison: DG CHEST 1V PORT dated 07/10/2013

CLINICAL DATA: Shortness of breath with left posterior chest pain.

EXAM:
CHEST  2 VIEW

[view not recorded (1 of 2)]
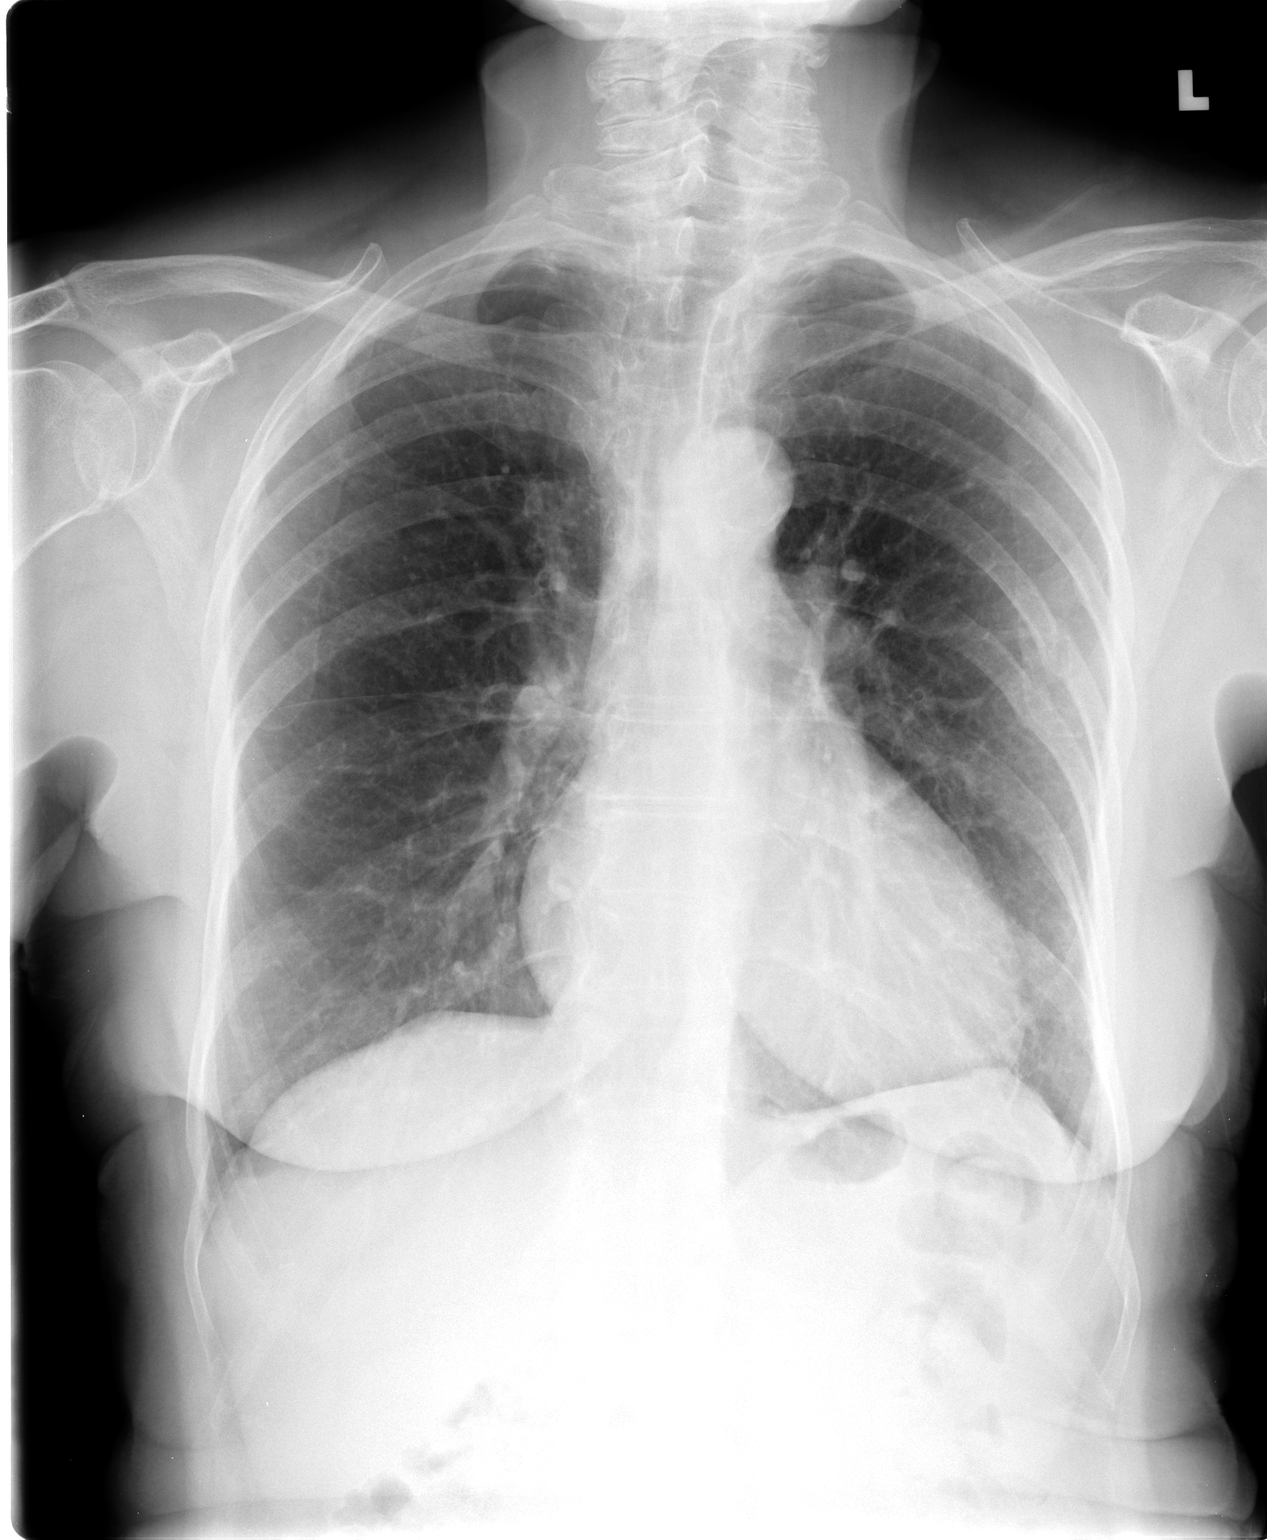

[view not recorded (2 of 2)]
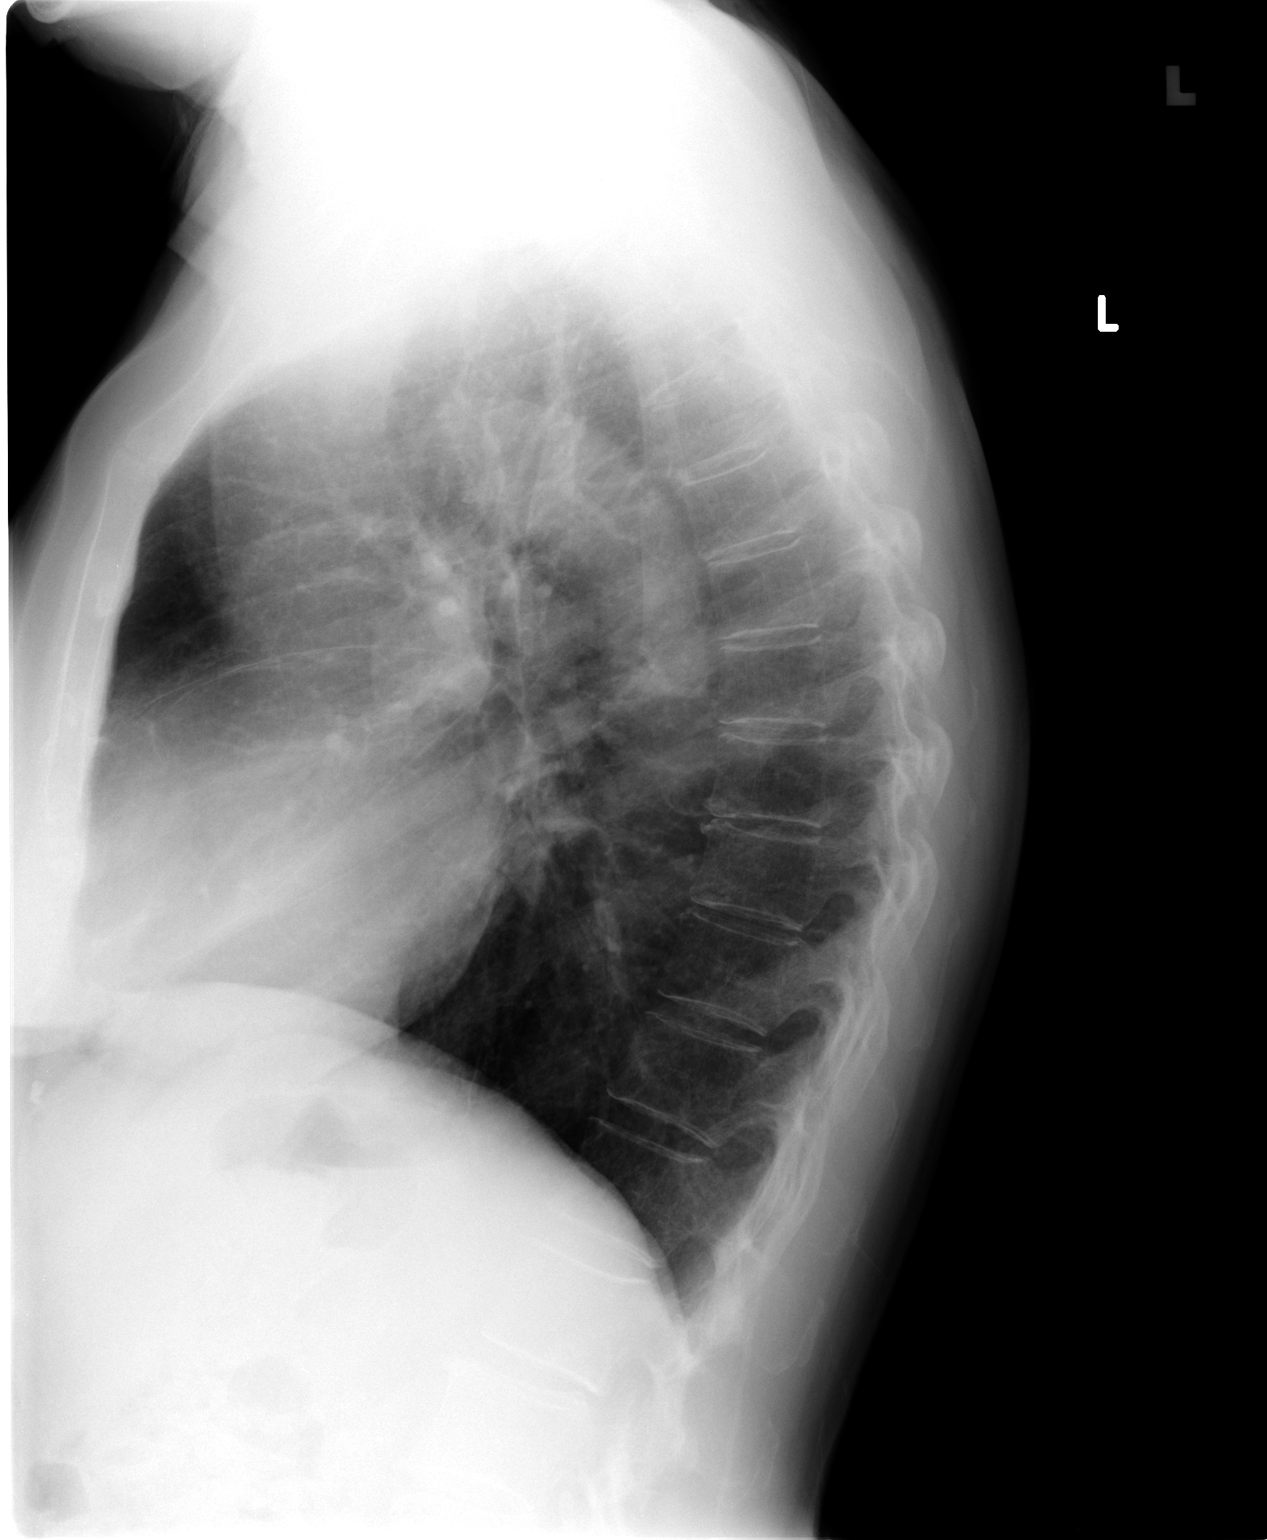

[2 of 2 positions shown; findings below may reference images not displayed]

FINDINGS: Trachea may be slightly deviated to the left, which can be seen with
right thyroid enlargement. Heart is at the upper limits of normal in
size to mildly enlarged. Right apical pleural parenchymal scarring.
Lungs are hyperinflated but otherwise clear. No pleural fluid. Old
left rib fractures.
IMPRESSION: Hyperinflation without acute finding.

## 2016-03-25 IMAGING — CR DG CLAVICLE*L*
2 series · 2 of 2 positions shown · non-contrast
Comparison: None.

CLINICAL DATA: Left clavicular pain

EXAM:
LEFT CLAVICLE - 2+ VIEWS

[view not recorded (1 of 2)]
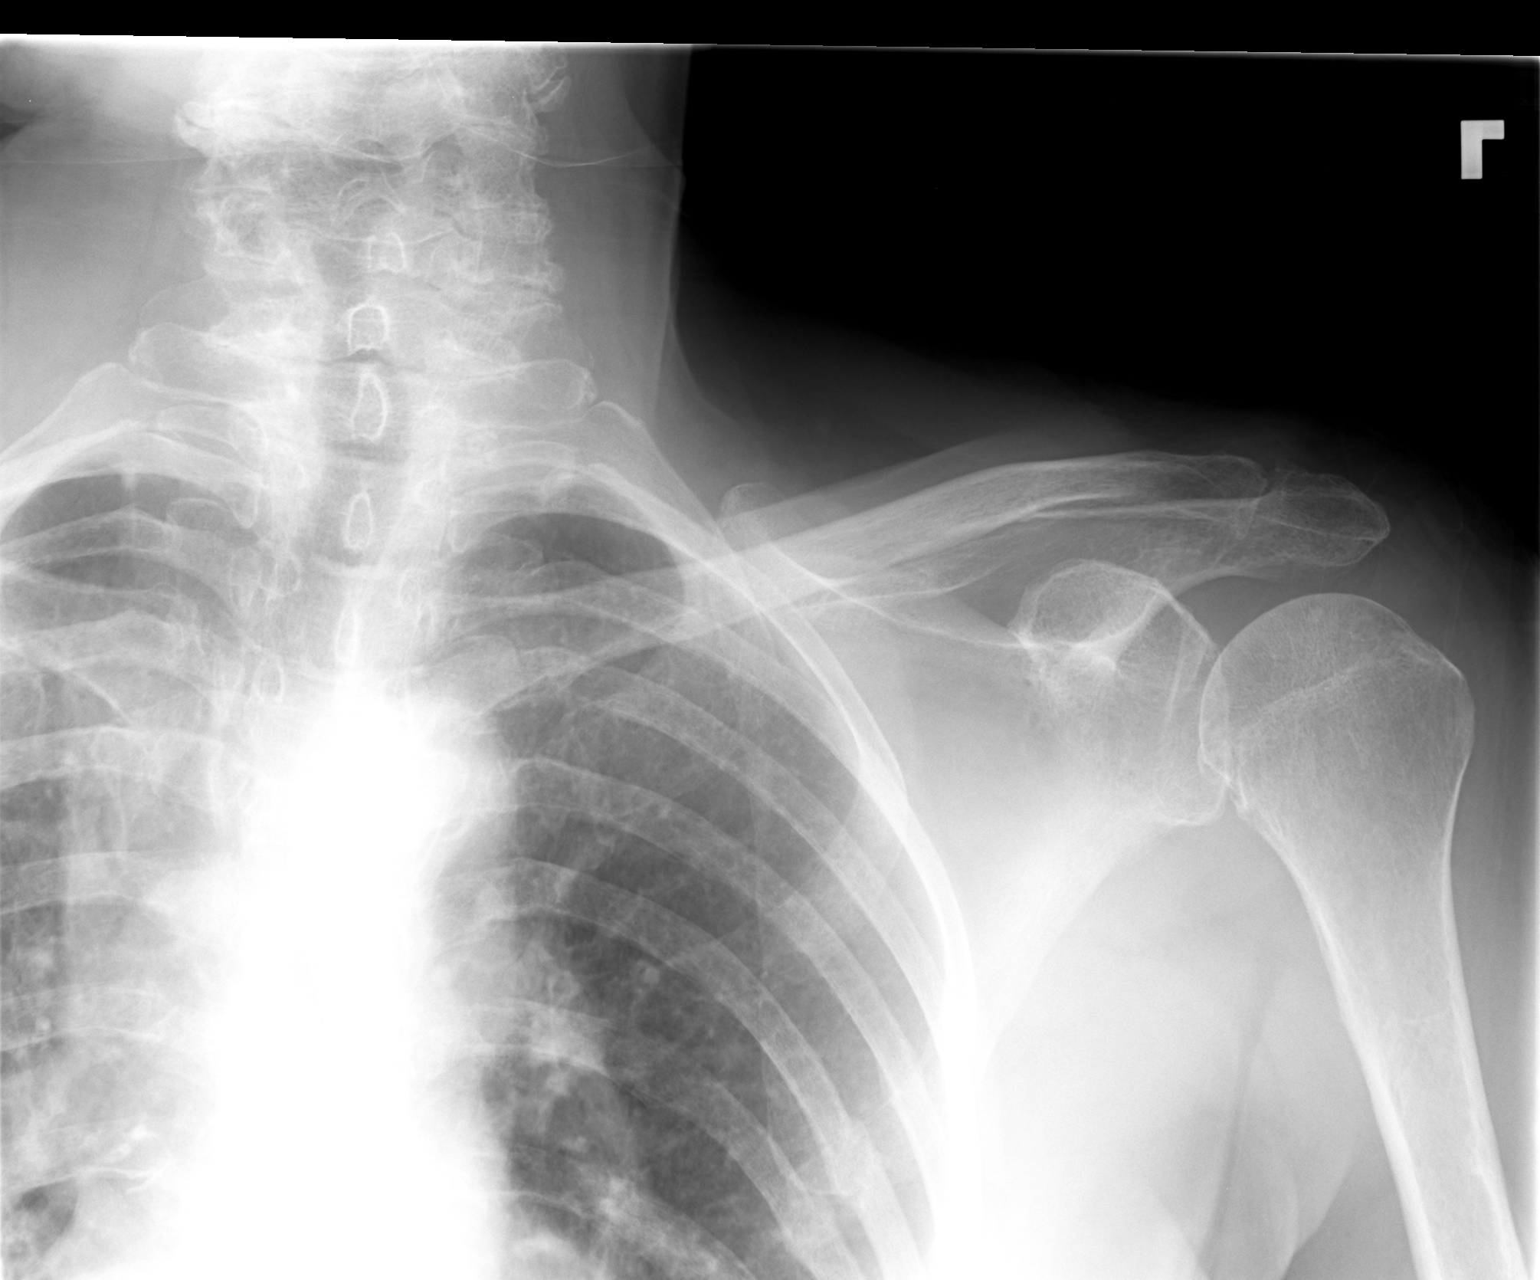

[view not recorded (2 of 2)]
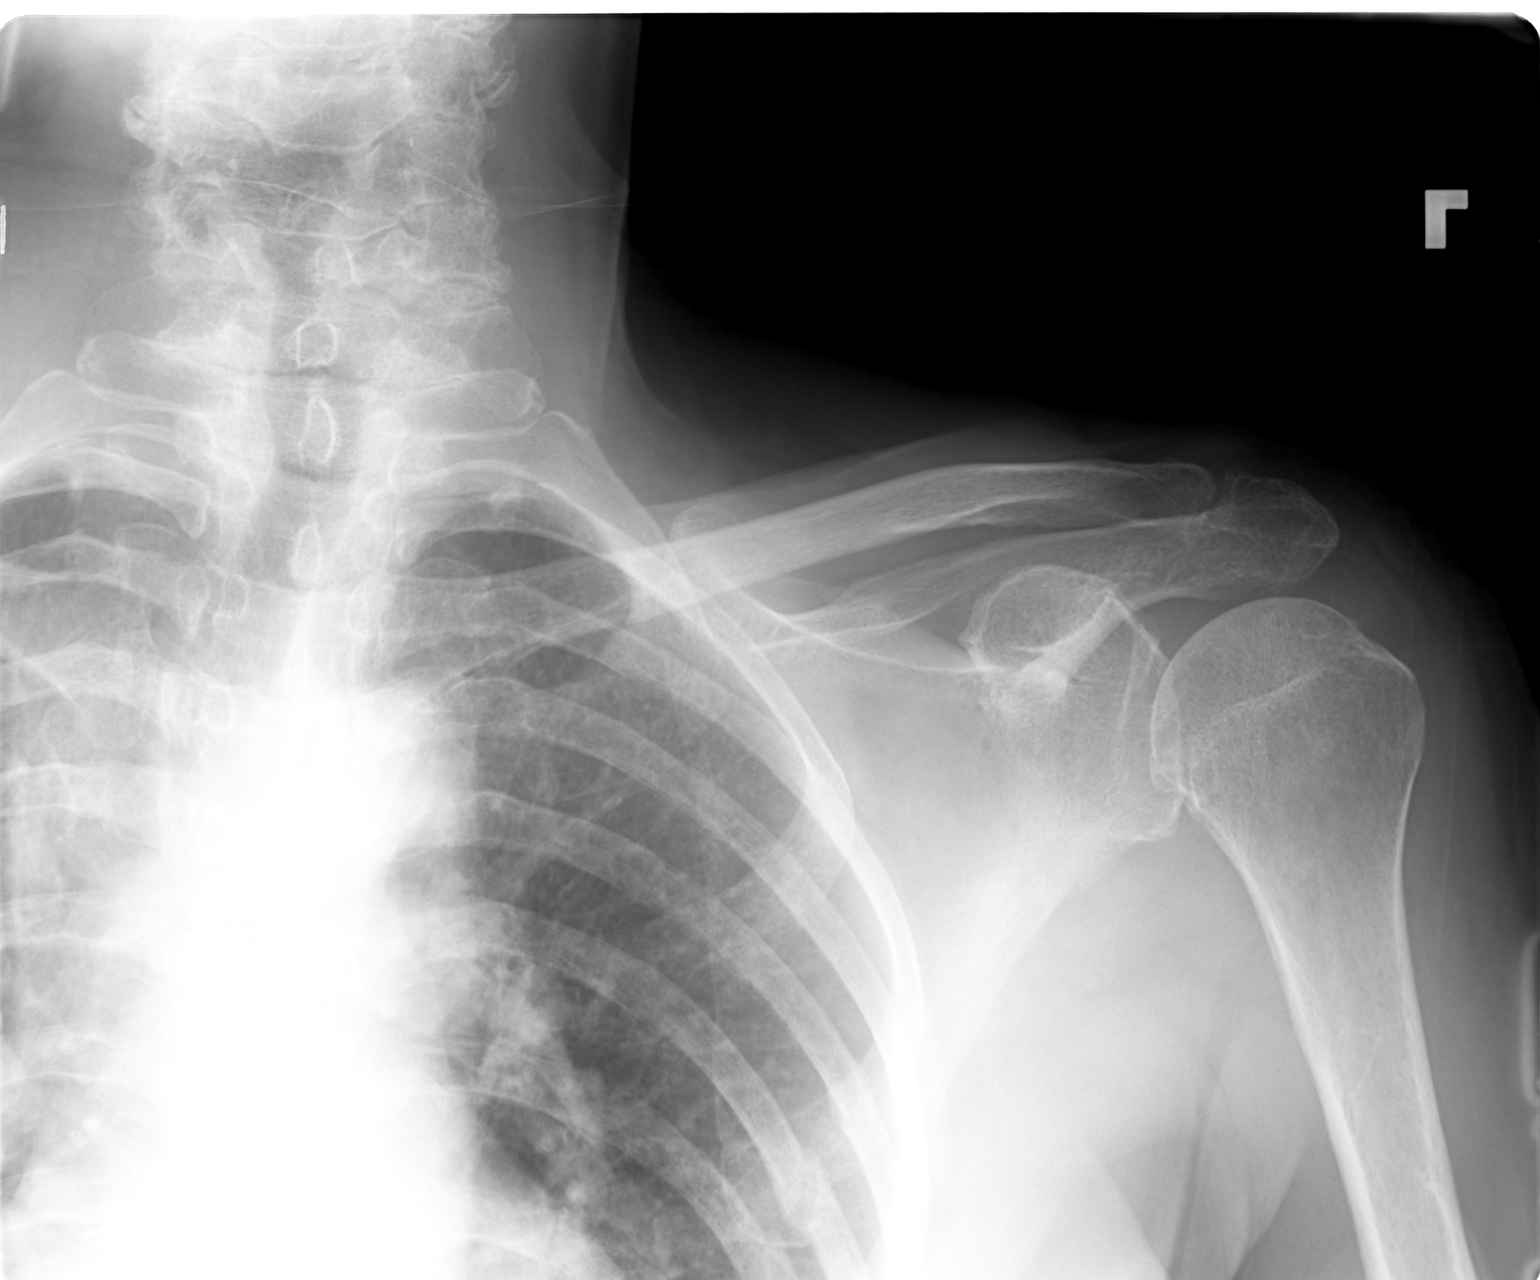

[2 of 2 positions shown; findings below may reference images not displayed]

FINDINGS: The clavicle is well visualized and demonstrates no acute fracture.
No focal bony abnormality to correspond with the patient's palpable
abnormality is seen. The visualized portions of the shoulder joint
are unremarkable. Old left rib fractures are seen.
IMPRESSION: No acute abnormality noted.

## 2016-03-25 MED ORDER — WARFARIN SODIUM 5 MG PO TABS: ORAL_TABLET | ORAL | Status: DC

## 2016-04-09 ENCOUNTER — Other Ambulatory Visit: Payer: Self-pay | Admitting: *Deleted

## 2016-04-09 DIAGNOSIS — H903 Sensorineural hearing loss, bilateral: Secondary | ICD-10-CM | POA: Diagnosis not present

## 2016-04-09 MED ORDER — METOPROLOL TARTRATE 75 MG PO TABS: 75.0000 mg | ORAL_TABLET | Freq: Two times a day (BID) | ORAL | Status: DC

## 2016-04-21 ENCOUNTER — Ambulatory Visit (INDEPENDENT_AMBULATORY_CARE_PROVIDER_SITE_OTHER): Payer: Medicare HMO | Admitting: *Deleted

## 2016-04-21 DIAGNOSIS — Z7901 Long term (current) use of anticoagulants: Secondary | ICD-10-CM | POA: Diagnosis not present

## 2016-04-21 DIAGNOSIS — I480 Paroxysmal atrial fibrillation: Secondary | ICD-10-CM | POA: Diagnosis not present

## 2016-04-21 DIAGNOSIS — Z79899 Other long term (current) drug therapy: Secondary | ICD-10-CM

## 2016-04-21 DIAGNOSIS — G459 Transient cerebral ischemic attack, unspecified: Secondary | ICD-10-CM | POA: Diagnosis not present

## 2016-04-21 LAB — POCT INR: INR: 2.7

## 2016-05-27 ENCOUNTER — Encounter: Payer: Medicare HMO | Admitting: Nurse Practitioner

## 2016-06-04 ENCOUNTER — Ambulatory Visit (INDEPENDENT_AMBULATORY_CARE_PROVIDER_SITE_OTHER): Payer: Medicare HMO | Admitting: *Deleted

## 2016-06-04 DIAGNOSIS — Z7901 Long term (current) use of anticoagulants: Secondary | ICD-10-CM | POA: Diagnosis not present

## 2016-06-04 DIAGNOSIS — Z79899 Other long term (current) drug therapy: Secondary | ICD-10-CM | POA: Diagnosis not present

## 2016-06-04 DIAGNOSIS — G459 Transient cerebral ischemic attack, unspecified: Secondary | ICD-10-CM

## 2016-06-04 DIAGNOSIS — I480 Paroxysmal atrial fibrillation: Secondary | ICD-10-CM

## 2016-06-04 LAB — POCT INR: INR: 3.5

## 2016-06-16 ENCOUNTER — Other Ambulatory Visit: Payer: Self-pay | Admitting: *Deleted

## 2016-06-16 NOTE — Telephone Encounter (Signed)
Pt had med filled today, can ask for refills at Oct appt.

## 2016-06-21 ENCOUNTER — Ambulatory Visit (INDEPENDENT_AMBULATORY_CARE_PROVIDER_SITE_OTHER): Payer: Medicare HMO | Admitting: *Deleted

## 2016-06-21 DIAGNOSIS — Z7901 Long term (current) use of anticoagulants: Secondary | ICD-10-CM

## 2016-06-21 DIAGNOSIS — I480 Paroxysmal atrial fibrillation: Secondary | ICD-10-CM

## 2016-06-21 DIAGNOSIS — G459 Transient cerebral ischemic attack, unspecified: Secondary | ICD-10-CM

## 2016-06-21 DIAGNOSIS — Z79899 Other long term (current) drug therapy: Secondary | ICD-10-CM | POA: Diagnosis not present

## 2016-06-21 LAB — POCT INR: INR: 2.5

## 2016-06-28 ENCOUNTER — Other Ambulatory Visit: Payer: Self-pay | Admitting: Internal Medicine

## 2016-06-28 ENCOUNTER — Encounter: Payer: Medicare HMO | Admitting: Internal Medicine

## 2016-06-28 MED ORDER — METOPROLOL TARTRATE 75 MG PO TABS: 75.0000 mg | ORAL_TABLET | Freq: Two times a day (BID) | ORAL | 0 refills | Status: DC

## 2016-07-07 ENCOUNTER — Encounter: Payer: Self-pay | Admitting: Internal Medicine

## 2016-07-07 ENCOUNTER — Ambulatory Visit (INDEPENDENT_AMBULATORY_CARE_PROVIDER_SITE_OTHER): Payer: Medicare HMO | Admitting: Internal Medicine

## 2016-07-07 VITALS — BP 112/72 | HR 63 | Ht 64.0 in | Wt 147.2 lb

## 2016-07-07 DIAGNOSIS — Z950 Presence of cardiac pacemaker: Secondary | ICD-10-CM | POA: Diagnosis not present

## 2016-07-07 DIAGNOSIS — I480 Paroxysmal atrial fibrillation: Secondary | ICD-10-CM | POA: Diagnosis not present

## 2016-07-07 DIAGNOSIS — R001 Bradycardia, unspecified: Secondary | ICD-10-CM

## 2016-07-07 DIAGNOSIS — Z23 Encounter for immunization: Secondary | ICD-10-CM

## 2016-07-07 LAB — CUP PACEART INCLINIC DEVICE CHECK
Battery Remaining Longevity: 128.4
Brady Statistic RA Percent Paced: 94 %
Brady Statistic RV Percent Paced: 0.72 %
Date Time Interrogation Session: 20171011160957
Implantable Lead Location: 753860
Implantable Lead Model: 1948
Lead Channel Pacing Threshold Amplitude: 0.5 V
Lead Channel Pacing Threshold Amplitude: 0.625 V
Lead Channel Pacing Threshold Pulse Width: 0.4 ms
Lead Channel Sensing Intrinsic Amplitude: 0.5 mV
Lead Channel Sensing Intrinsic Amplitude: 8.3 mV
Lead Channel Setting Pacing Amplitude: 0.875
Lead Channel Setting Sensing Sensitivity: 2 mV
MDC IDC LEAD IMPLANT DT: 20160518
MDC IDC LEAD IMPLANT DT: 20160518
MDC IDC LEAD LOCATION: 753859
MDC IDC LEAD MODEL: 1944
MDC IDC MSMT BATTERY VOLTAGE: 3.01 V
MDC IDC MSMT LEADCHNL RA IMPEDANCE VALUE: 537.5 Ohm
MDC IDC MSMT LEADCHNL RV IMPEDANCE VALUE: 662.5 Ohm
MDC IDC MSMT LEADCHNL RV PACING THRESHOLD PULSEWIDTH: 0.4 ms
MDC IDC PG SERIAL: 7759914
MDC IDC SET LEADCHNL RA PACING AMPLITUDE: 2 V
MDC IDC SET LEADCHNL RV PACING PULSEWIDTH: 0.4 ms
Pulse Gen Model: 2240

## 2016-07-07 MED ORDER — METOPROLOL TARTRATE 50 MG PO TABS: 50.0000 mg | ORAL_TABLET | Freq: Two times a day (BID) | ORAL | Status: DC

## 2016-07-07 NOTE — Progress Notes (Signed)
Patient Care Team: Unk Pinto, MD as PCP - General (Internal Medicine) [name], MD as Consulting Physician (Cardiology) Clent Jacks, MD as Consulting Physician (Ophthalmology)   HPI  Erica Carroll is a 82 y.o. female Seen following a recent hospitalization for tachybradycardia syndrome. She underwent pacing. We initiated beta blockers to try to protect against tachyarrhythmia with limitations of up titration related to blood pressure.. She comes in today her blood pressures at home and 105-130 range.   She was seen recently in the hospital because of atrial fibrillation with a rapid rate. This episode lasted, by device interrogation, about 40 minutes. It was associated with chest pain and lightheadedness and stopped concurrent with by mouth Cardizem.   She is put on long-acting Cardizem and since that time has felt terrible with fatigue and lassitude   most of her atrial fibrillation was occurring at night. She has modified her evening eating habits and has had little  She has had some problems with dizziness..  She has had no subsequent palpitations.    Past Medical History:  Diagnosis Date  . A-fib ([place])    a. Dx 06/2013;  b. 06/2013 Echo:  EF 55-60%, no reg wma, mild AI, mod dil LA.  Marland Kitchen Allergy   . GERD (gastroesophageal reflux disease)   . Hyperlipidemia   . RA (rheumatoid arthritis) ([place])    " in my hands "  . Sinus bradycardia 07/11/2013  . TIA (transient ischemic attack)    a. with fall 05/2013 -> neg head CT and MRI/MRA;  b. Plavix started.  . Urinary incontinence   . Vitamin D deficiency     Past Surgical History:  Procedure Laterality Date  . APPENDECTOMY  1936  . EP IMPLANTABLE DEVICE N/A 02/12/2015   Procedure: Pacemaker Implant;  Surgeon: Deboraha Sprang, MD;  Location: [facility];  Service: Cardiovascular;  Laterality: N/A;  . EYE SURGERY Right 1994   CE/IOL  . EYE SURGERY Left 1998   CE/IOL    Current Outpatient Prescriptions   Medication Sig Dispense Refill  . acetaminophen (TYLENOL) 325 MG tablet Take 325 mg by mouth every 6 (six) hours as needed for mild pain.     Marland Kitchen b complex vitamins tablet Take 1 tablet by mouth daily.    . Cholecalciferol (VITAMIN D PO) Take 3,000 Units by mouth daily.     Marland Kitchen diltiazem (CARDIZEM) 30 MG tablet Take 1 tablet (30 mg total) by mouth daily as needed. For Afib episodes 15 tablet 1  . FLUZONE HIGH-DOSE 0.5 ML SUSY TO BE ADMINISTERED BY PHARMACIST FOR IMMUNIZATION  0  . GLUCOSAMINE-CHONDROITIN PO Take 1 tablet by mouth daily.    Marland Kitchen MAGNESIUM OXIDE, ANTACID, PO Take 1 tablet by mouth daily.    . Metoprolol Tartrate 75 MG TABS Take 75 mg by mouth 2 (two) times daily. 60 tablet 0  . OVER THE COUNTER MEDICATION Take 1 tablet by mouth daily. Preserve vision vitamin    . polyethylene glycol (MIRALAX / GLYCOLAX) packet Take 17 g by mouth daily as needed (constipation).    . warfarin (COUMADIN) 5 MG tablet Take 1 to 1.5 tablets by mouth daily as directed by [facility] 40 tablet 2   No current facility-administered medications for this visit.     Allergies  Allergen Reactions  . Other Other (See Comments)    News Print: (freshly printed newspapers) cause her eyes to water . Allergy type reaction. Medal: itching & rash  Review of Systems negative except from HPI and PMH  Physical Exam BP 112/72   Pulse 63   Ht 5\' 4"  (1.626 m)   Wt 147 lb 3.2 oz (66.8 kg)   SpO2 98%   BMI 25.27 kg/m  Well developed and well nourished in no acute distress HENT normal E scleral and icterus clear Neck Supple JVP flat; carotids brisk and full Clear to ausculation  Regular rate and rhythm, no murmurs gallops or rub Soft with active bowel sounds No clubbing cyanosis  Edema Alert and oriented, gro ssly normal motor and sensory function Skin Warm and Dry  ECG Atrial pacing at 60 Intervals 24/12/44 excellent axis left -66 T-wave inversions inferolaterally Unchanged 2016    Assessment  and  Plan  Paroxysmal atrial fibrillation with a very rapid rate  Junctional tach  Sinus bradycardia  Nonischemic cardiomyopathy  PVCs  Hypertension  Dizziness  Pacemaker-[employer]  Abnormal electrocardiogram  BP with good control  She continues with PVCs and these may be contributing to her cardiomyopathy  However, at her age, I am reluctant to add medications for suppression.  Junctional tachycardia is not symptomatic.  Atrial fibrillation is less frequent HENCE, we will decrease her metoprolol from 75--50 twice a day.

## 2016-07-07 NOTE — Patient Instructions (Addendum)
Medication Instructions: - Your physician has recommended you make the following change in your medication:  1) Decrease metoprolol tartrate to 50 mg one tablet by mouth twice daily  Labwork: - none ordered  Procedures/Testing: - none ordered  Follow-Up: - Remote monitoring is used to monitor your Pacemaker of ICD from home. This monitoring reduces the number of office visits required to check your device to one time per year. It allows [place] to keep an eye on the functioning of your device to ensure it is working properly. You are scheduled for a device check from home on 10/06/16. You may send your transmission at any time that day. If you have a wireless device, the transmission will be sent automatically. After your physician reviews your transmission, you will receive a postcard with your next transmission date.  - Your physician wants you to follow-up in: 1 year with Dr. [name]. You will receive a reminder letter in the mail two months in advance. If you don't receive a letter, please call our office to schedule the follow-up appointment.  Any Additional Special Instructions Will Be Listed Below (If Applicable).     If you need a refill on your cardiac medications before your next appointment, please call your pharmacy.

## 2016-07-13 ENCOUNTER — Other Ambulatory Visit: Payer: Self-pay | Admitting: Pharmacist

## 2016-07-13 DIAGNOSIS — I480 Paroxysmal atrial fibrillation: Secondary | ICD-10-CM

## 2016-07-13 MED ORDER — WARFARIN SODIUM 5 MG PO TABS: ORAL_TABLET | ORAL | 2 refills | Status: DC

## 2016-07-23 ENCOUNTER — Other Ambulatory Visit: Payer: Self-pay | Admitting: Internal Medicine

## 2016-07-23 DIAGNOSIS — R001 Bradycardia, unspecified: Secondary | ICD-10-CM

## 2016-07-23 MED ORDER — METOPROLOL TARTRATE 50 MG PO TABS: 50.0000 mg | ORAL_TABLET | Freq: Two times a day (BID) | ORAL | 3 refills | Status: DC

## 2016-08-05 ENCOUNTER — Ambulatory Visit: Payer: Self-pay | Admitting: Internal Medicine

## 2016-09-01 ENCOUNTER — Other Ambulatory Visit: Payer: Self-pay | Admitting: Internal Medicine

## 2016-09-01 MED ORDER — DILTIAZEM HCL 30 MG PO TABS: 30.0000 mg | ORAL_TABLET | Freq: Every day | ORAL | 10 refills | Status: DC | PRN

## 2016-09-13 ENCOUNTER — Telehealth: Payer: Self-pay | Admitting: Internal Medicine

## 2016-09-13 ENCOUNTER — Ambulatory Visit: Payer: Self-pay | Admitting: Internal Medicine

## 2016-09-13 NOTE — Telephone Encounter (Signed)
Ms. Nigel Mormon (daughter) is calling in reference to the metoprolol medication, which Erica Carroll is currently at 50 mg and wanted to know if she could back to 75 mg, because Ms. Sturtevant has been having AFIB episodes. Please contact daughter [name] at [phone].

## 2016-09-13 NOTE — Telephone Encounter (Signed)
Spoke w/ pt daughter and gave her my direct number so pt's son could call and get help w/ pt home monitor.

## 2016-09-13 NOTE — Telephone Encounter (Signed)
New message ° °Pt is returning call  ° °Please call back °

## 2016-09-13 NOTE — Telephone Encounter (Signed)
Remote transmission received. Device tech RN will review w/ triage RN.

## 2016-09-13 NOTE — Telephone Encounter (Signed)
Spoke w/ pt daughter and gave her my direct number so pt son could call and get help sending remote transmission. Pt daughter verbalized understanding.

## 2016-09-13 NOTE — Telephone Encounter (Signed)
I spoke with pt's daughter.  At last office visit in October lopressor was decreased to 50 mg twice daily. Daughter reports pt has been having more Afib episodes.  Has had at least 3 since she saw Dr. [name]. Last episode was yesterday.  Usually occurs in the morning.  Pt takes prn Cardizem when she has episode.  Daughter reports pt is very tired after Afib episode.  I told daughter she could send remote transmission and we would review with Dr. [name] when he was back in office tomorrow.  I transferred call to device clinic to explain to daughter how to send transmission.

## 2016-09-14 NOTE — Telephone Encounter (Signed)
AT/AF burden 2.1% since 07/07/16.  AF episodes mostly brief, but recent episodes on 12/17 were up to 1hr 5min in duration.  V rates during AMS were >120bpm more than 40% of the time since 07/07/16.  Metoprolol tartrate was decreased to 50mg  BID at appointment with Dr. [name] on 07/07/16.  Will route to Dr. [name] and Nira Conn, RN for further recommendations.

## 2016-09-15 ENCOUNTER — Ambulatory Visit (INDEPENDENT_AMBULATORY_CARE_PROVIDER_SITE_OTHER): Payer: Medicare HMO | Admitting: *Deleted

## 2016-09-15 DIAGNOSIS — Z79899 Other long term (current) drug therapy: Secondary | ICD-10-CM

## 2016-09-15 DIAGNOSIS — G459 Transient cerebral ischemic attack, unspecified: Secondary | ICD-10-CM

## 2016-09-15 DIAGNOSIS — Z7901 Long term (current) use of anticoagulants: Secondary | ICD-10-CM | POA: Diagnosis not present

## 2016-09-15 DIAGNOSIS — I480 Paroxysmal atrial fibrillation: Secondary | ICD-10-CM

## 2016-09-15 LAB — POCT INR: INR: 4.1

## 2016-09-15 MED ORDER — METOPROLOL TARTRATE 25 MG PO TABS: ORAL_TABLET | ORAL | 6 refills | Status: DC

## 2016-09-15 NOTE — Telephone Encounter (Signed)
Lets resume meto at 75 bid plz

## 2016-09-15 NOTE — Telephone Encounter (Signed)
I called and spoke with the patient's daughter (DPR) and she is aware of the findings on the patient's remote transmission. She is aware that Dr. [name] would like the patient to increase her metoprolol to 75 mg BID. The patient's daughter is in agreement with this.  I advised I will update this at the patient's pharmacy.

## 2016-09-15 NOTE — Telephone Encounter (Signed)
Follow Up:    Daughter called,was waiting to find out what to do about her medicine. Please call today if possible please.

## 2016-09-15 NOTE — Telephone Encounter (Signed)
Open this encounter by mistake

## 2016-09-29 ENCOUNTER — Ambulatory Visit (INDEPENDENT_AMBULATORY_CARE_PROVIDER_SITE_OTHER): Payer: Medicare HMO | Admitting: *Deleted

## 2016-09-29 DIAGNOSIS — Z7901 Long term (current) use of anticoagulants: Secondary | ICD-10-CM

## 2016-09-29 DIAGNOSIS — I480 Paroxysmal atrial fibrillation: Secondary | ICD-10-CM | POA: Diagnosis not present

## 2016-09-29 DIAGNOSIS — Z79899 Other long term (current) drug therapy: Secondary | ICD-10-CM

## 2016-09-29 DIAGNOSIS — G459 Transient cerebral ischemic attack, unspecified: Secondary | ICD-10-CM | POA: Diagnosis not present

## 2016-09-29 LAB — POCT INR: INR: 2.3

## 2016-10-06 ENCOUNTER — Ambulatory Visit (INDEPENDENT_AMBULATORY_CARE_PROVIDER_SITE_OTHER): Payer: Medicare HMO | Admitting: *Deleted

## 2016-10-06 DIAGNOSIS — I495 Sick sinus syndrome: Secondary | ICD-10-CM

## 2016-10-06 NOTE — Progress Notes (Signed)
Remote pacemaker transmission.   

## 2016-10-07 ENCOUNTER — Encounter: Payer: Self-pay | Admitting: Cardiology

## 2016-10-07 ENCOUNTER — Other Ambulatory Visit: Payer: Self-pay | Admitting: *Deleted

## 2016-10-07 DIAGNOSIS — I480 Paroxysmal atrial fibrillation: Secondary | ICD-10-CM

## 2016-10-07 MED ORDER — WARFARIN SODIUM 5 MG PO TABS: ORAL_TABLET | ORAL | 2 refills | Status: DC

## 2016-10-18 ENCOUNTER — Other Ambulatory Visit: Payer: Self-pay | Admitting: Internal Medicine

## 2016-10-20 ENCOUNTER — Ambulatory Visit (INDEPENDENT_AMBULATORY_CARE_PROVIDER_SITE_OTHER): Payer: Medicare HMO | Admitting: *Deleted

## 2016-10-20 DIAGNOSIS — Z7901 Long term (current) use of anticoagulants: Secondary | ICD-10-CM | POA: Diagnosis not present

## 2016-10-20 DIAGNOSIS — Z79899 Other long term (current) drug therapy: Secondary | ICD-10-CM

## 2016-10-20 DIAGNOSIS — I480 Paroxysmal atrial fibrillation: Secondary | ICD-10-CM

## 2016-10-20 DIAGNOSIS — G459 Transient cerebral ischemic attack, unspecified: Secondary | ICD-10-CM

## 2016-10-20 LAB — POCT INR: INR: 2.4

## 2016-10-21 ENCOUNTER — Encounter: Payer: Self-pay | Admitting: Cardiology

## 2016-10-25 LAB — CUP PACEART REMOTE DEVICE CHECK
Battery Remaining Longevity: 129 mo
Battery Voltage: 3.01 V
Brady Statistic AP VP Percent: 1.1 %
Brady Statistic AS VS Percent: 4.7 %
Brady Statistic RA Percent Paced: 93 %
Implantable Lead Implant Date: 20160518
Implantable Lead Implant Date: 20160518
Implantable Lead Location: 753860
Implantable Lead Model: 1948
Implantable Pulse Generator Implant Date: 20160518
Lead Channel Impedance Value: 510 Ohm
Lead Channel Pacing Threshold Amplitude: 0.5 V
Lead Channel Pacing Threshold Pulse Width: 0.4 ms
Lead Channel Pacing Threshold Pulse Width: 0.4 ms
Lead Channel Sensing Intrinsic Amplitude: 0.7 mV
Lead Channel Setting Pacing Amplitude: 0.875
MDC IDC LEAD LOCATION: 753859
MDC IDC MSMT BATTERY REMAINING PERCENTAGE: 95.5 %
MDC IDC MSMT LEADCHNL RV IMPEDANCE VALUE: 680 Ohm
MDC IDC MSMT LEADCHNL RV PACING THRESHOLD AMPLITUDE: 0.625 V
MDC IDC MSMT LEADCHNL RV SENSING INTR AMPL: 8.4 mV
MDC IDC PG SERIAL: 7759914
MDC IDC SESS DTM: 20180110070013
MDC IDC SET LEADCHNL RA PACING AMPLITUDE: 2 V
MDC IDC SET LEADCHNL RV PACING PULSEWIDTH: 0.4 ms
MDC IDC SET LEADCHNL RV SENSING SENSITIVITY: 2 mV
MDC IDC STAT BRADY AP VS PERCENT: 94 %
MDC IDC STAT BRADY AS VP PERCENT: 1 %
MDC IDC STAT BRADY RV PERCENT PACED: 1.1 %

## 2016-11-17 ENCOUNTER — Ambulatory Visit (INDEPENDENT_AMBULATORY_CARE_PROVIDER_SITE_OTHER): Payer: Medicare HMO | Admitting: *Deleted

## 2016-11-17 DIAGNOSIS — Z7901 Long term (current) use of anticoagulants: Secondary | ICD-10-CM

## 2016-11-17 DIAGNOSIS — Z79899 Other long term (current) drug therapy: Secondary | ICD-10-CM

## 2016-11-17 DIAGNOSIS — G459 Transient cerebral ischemic attack, unspecified: Secondary | ICD-10-CM

## 2016-11-17 DIAGNOSIS — I480 Paroxysmal atrial fibrillation: Secondary | ICD-10-CM | POA: Diagnosis not present

## 2016-11-17 LAB — POCT INR: INR: 3.7

## 2016-12-08 ENCOUNTER — Ambulatory Visit (INDEPENDENT_AMBULATORY_CARE_PROVIDER_SITE_OTHER): Payer: Medicare HMO | Admitting: *Deleted

## 2016-12-08 DIAGNOSIS — Z7901 Long term (current) use of anticoagulants: Secondary | ICD-10-CM | POA: Diagnosis not present

## 2016-12-08 DIAGNOSIS — I480 Paroxysmal atrial fibrillation: Secondary | ICD-10-CM

## 2016-12-08 DIAGNOSIS — G459 Transient cerebral ischemic attack, unspecified: Secondary | ICD-10-CM

## 2016-12-08 DIAGNOSIS — Z79899 Other long term (current) drug therapy: Secondary | ICD-10-CM

## 2016-12-08 LAB — POCT INR: INR: 3.2

## 2016-12-17 ENCOUNTER — Other Ambulatory Visit: Payer: Self-pay | Admitting: Internal Medicine

## 2016-12-17 MED ORDER — METOPROLOL TARTRATE 25 MG PO TABS
ORAL_TABLET | ORAL | 6 refills | Status: DC
Start: 2016-12-17 — End: 2017-09-19

## 2017-01-05 ENCOUNTER — Ambulatory Visit (INDEPENDENT_AMBULATORY_CARE_PROVIDER_SITE_OTHER): Payer: Medicare HMO | Admitting: *Deleted

## 2017-01-05 DIAGNOSIS — I495 Sick sinus syndrome: Secondary | ICD-10-CM

## 2017-01-05 NOTE — Progress Notes (Signed)
Remote pacemaker transmission.   

## 2017-01-06 ENCOUNTER — Encounter: Payer: Self-pay | Admitting: Cardiology

## 2017-01-07 ENCOUNTER — Ambulatory Visit (INDEPENDENT_AMBULATORY_CARE_PROVIDER_SITE_OTHER): Payer: Medicare HMO

## 2017-01-07 DIAGNOSIS — I480 Paroxysmal atrial fibrillation: Secondary | ICD-10-CM

## 2017-01-07 DIAGNOSIS — Z79899 Other long term (current) drug therapy: Secondary | ICD-10-CM | POA: Diagnosis not present

## 2017-01-07 DIAGNOSIS — G459 Transient cerebral ischemic attack, unspecified: Secondary | ICD-10-CM | POA: Diagnosis not present

## 2017-01-07 DIAGNOSIS — Z7901 Long term (current) use of anticoagulants: Secondary | ICD-10-CM | POA: Diagnosis not present

## 2017-01-07 LAB — CUP PACEART REMOTE DEVICE CHECK
Battery Remaining Longevity: 130 mo
Battery Remaining Percentage: 95.5 %
Brady Statistic AP VS Percent: 95 %
Brady Statistic AS VS Percent: 3.6 %
Implantable Lead Implant Date: 20160518
Implantable Lead Location: 753860
Implantable Lead Model: 1948
Lead Channel Pacing Threshold Amplitude: 0.5 V
Lead Channel Pacing Threshold Amplitude: 0.625 V
Lead Channel Pacing Threshold Pulse Width: 0.4 ms
Lead Channel Pacing Threshold Pulse Width: 0.4 ms
Lead Channel Setting Pacing Amplitude: 2 V
Lead Channel Setting Sensing Sensitivity: 2 mV
MDC IDC LEAD IMPLANT DT: 20160518
MDC IDC LEAD LOCATION: 753859
MDC IDC MSMT BATTERY VOLTAGE: 3.01 V
MDC IDC MSMT LEADCHNL RA IMPEDANCE VALUE: 540 Ohm
MDC IDC MSMT LEADCHNL RA SENSING INTR AMPL: 0.5 mV
MDC IDC MSMT LEADCHNL RV IMPEDANCE VALUE: 700 Ohm
MDC IDC MSMT LEADCHNL RV SENSING INTR AMPL: 9.1 mV
MDC IDC PG IMPLANT DT: 20160518
MDC IDC PG SERIAL: 7759914
MDC IDC SESS DTM: 20180411065132
MDC IDC SET LEADCHNL RV PACING AMPLITUDE: 0.875
MDC IDC SET LEADCHNL RV PACING PULSEWIDTH: 0.4 ms
MDC IDC STAT BRADY AP VP PERCENT: 1 %
MDC IDC STAT BRADY AS VP PERCENT: 1 %
MDC IDC STAT BRADY RA PERCENT PACED: 95 %
MDC IDC STAT BRADY RV PERCENT PACED: 1 %

## 2017-01-07 LAB — POCT INR: INR: 3

## 2017-01-20 ENCOUNTER — Encounter: Payer: Self-pay | Admitting: Cardiology

## 2017-02-07 ENCOUNTER — Ambulatory Visit (INDEPENDENT_AMBULATORY_CARE_PROVIDER_SITE_OTHER): Payer: Medicare HMO | Admitting: *Deleted

## 2017-02-07 DIAGNOSIS — I480 Paroxysmal atrial fibrillation: Secondary | ICD-10-CM

## 2017-02-07 DIAGNOSIS — Z79899 Other long term (current) drug therapy: Secondary | ICD-10-CM

## 2017-02-07 DIAGNOSIS — G459 Transient cerebral ischemic attack, unspecified: Secondary | ICD-10-CM

## 2017-02-07 DIAGNOSIS — Z7901 Long term (current) use of anticoagulants: Secondary | ICD-10-CM | POA: Diagnosis not present

## 2017-02-07 LAB — POCT INR: INR: 3.3

## 2017-03-02 ENCOUNTER — Ambulatory Visit (INDEPENDENT_AMBULATORY_CARE_PROVIDER_SITE_OTHER): Payer: Medicare HMO | Admitting: Internal Medicine

## 2017-03-02 ENCOUNTER — Encounter: Payer: Self-pay | Admitting: Internal Medicine

## 2017-03-02 VITALS — BP 150/72 | HR 60 | Temp 97.4°F | Resp 16 | Ht 63.0 in | Wt 147.4 lb

## 2017-03-02 DIAGNOSIS — Z136 Encounter for screening for cardiovascular disorders: Secondary | ICD-10-CM | POA: Diagnosis not present

## 2017-03-02 DIAGNOSIS — Z23 Encounter for immunization: Secondary | ICD-10-CM | POA: Diagnosis not present

## 2017-03-02 DIAGNOSIS — I10 Essential (primary) hypertension: Secondary | ICD-10-CM

## 2017-03-02 DIAGNOSIS — E782 Mixed hyperlipidemia: Secondary | ICD-10-CM

## 2017-03-02 DIAGNOSIS — E559 Vitamin D deficiency, unspecified: Secondary | ICD-10-CM

## 2017-03-02 DIAGNOSIS — R7303 Prediabetes: Secondary | ICD-10-CM

## 2017-03-02 DIAGNOSIS — Z79899 Other long term (current) drug therapy: Secondary | ICD-10-CM | POA: Diagnosis not present

## 2017-03-02 DIAGNOSIS — Z0000 Encounter for general adult medical examination without abnormal findings: Secondary | ICD-10-CM | POA: Diagnosis not present

## 2017-03-02 DIAGNOSIS — I480 Paroxysmal atrial fibrillation: Secondary | ICD-10-CM

## 2017-03-02 DIAGNOSIS — Z0001 Encounter for general adult medical examination with abnormal findings: Secondary | ICD-10-CM

## 2017-03-02 LAB — CBC WITH DIFFERENTIAL/PLATELET
BASOS PCT: 0 %
Basophils Absolute: 0 cells/uL (ref 0–200)
Eosinophils Absolute: 370 cells/uL (ref 15–500)
Eosinophils Relative: 5 %
HCT: 36.9 % (ref 35.0–45.0)
Hemoglobin: 12.3 g/dL (ref 11.7–15.5)
LYMPHS PCT: 31 %
Lymphs Abs: 2294 cells/uL (ref 850–3900)
MCH: 30.7 pg (ref 27.0–33.0)
MCHC: 33.3 g/dL (ref 32.0–36.0)
MCV: 92 fL (ref 80.0–100.0)
MONOS PCT: 11 %
MPV: 9.5 fL (ref 7.5–12.5)
Monocytes Absolute: 814 cells/uL (ref 200–950)
Neutro Abs: 3922 cells/uL (ref 1500–7800)
Neutrophils Relative %: 53 %
PLATELETS: 193 10*3/uL (ref 140–400)
RBC: 4.01 MIL/uL (ref 3.80–5.10)
RDW: 13.5 % (ref 11.0–15.0)
WBC: 7.4 10*3/uL (ref 3.8–10.8)

## 2017-03-02 NOTE — Patient Instructions (Signed)
Preventive Care for Adults A healthy lifestyle and preventive care can promote health and wellness. Preventive health guidelines for women include the following key practices.  A routine yearly physical is a good way to check with your health care provider about your health and preventive screening. It is a chance to share any concerns and updates on your health and to receive a thorough exam.  Visit your dentist for a routine exam and preventive care every 6 months. Brush your teeth twice a day and floss once a day. Good oral hygiene prevents tooth decay and gum disease.  The frequency of eye exams is based on your age, health, family medical history, use of contact lenses, and other factors. Follow your health care provider's recommendations for frequency of eye exams.  Eat a healthy diet. Foods like vegetables, fruits, whole grains, low-fat dairy products, and lean protein foods contain the nutrients you need without too many calories. Decrease your intake of foods high in solid fats, added sugars, and salt. Eat the right amount of calories for you.Get information about a proper diet from your health care provider, if necessary.  Regular physical exercise is one of the most important things you can do for your health. Most adults should get at least 150 minutes of moderate-intensity exercise (any activity that increases your heart rate and causes you to sweat) each week. In addition, most adults need muscle-strengthening exercises on 2 or more days a week.  Maintain a healthy weight. The body mass index (BMI) is a screening tool to identify possible weight problems. It provides an estimate of body fat based on height and weight. Your health care provider can find your BMI and can help you achieve or maintain a healthy weight.For adults 20 years and older:  A BMI below 18.5 is considered underweight.  A BMI of 18.5 to 24.9 is normal.  A BMI of 25 to 29.9 is considered overweight.  A BMI of  30 and above is considered obese.  Maintain normal blood lipids and cholesterol levels by exercising and minimizing your intake of saturated fat. Eat a balanced diet with plenty of fruit and vegetables. Blood tests for lipids and cholesterol should begin at age 76 and be repeated every 5 years. If your lipid or cholesterol levels are high, you are over 50, or you are at high risk for heart disease, you may need your cholesterol levels checked more frequently.Ongoing high lipid and cholesterol levels should be treated with medicines if diet and exercise are not working.  If you smoke, find out from your health care provider how to quit. If you do not use tobacco, do not start.  Lung cancer screening is recommended for adults aged 22-80 years who are at high risk for developing lung cancer because of a history of smoking. A yearly low-dose CT scan of the lungs is recommended for people who have at least a 30-pack-year history of smoking and are a current smoker or have quit within the past 15 years. A pack year of smoking is smoking an average of 1 pack of cigarettes a day for 1 year (for example: 1 pack a day for 30 years or 2 packs a day for 15 years). Yearly screening should continue until the smoker has stopped smoking for at least 15 years. Yearly screening should be stopped for people who develop a health problem that would prevent them from having lung cancer treatment.  If you are pregnant, do not drink alcohol. If you are breastfeeding,  be very cautious about drinking alcohol. If you are not pregnant and choose to drink alcohol, do not have more than 1 drink per day. One drink is considered to be 12 ounces (355 mL) of beer, 5 ounces (148 mL) of wine, or 1.5 ounces (44 mL) of liquor.  Avoid use of street drugs. Do not share needles with anyone. Ask for help if you need support or instructions about stopping the use of drugs.  High blood pressure causes heart disease and increases the risk of  stroke. Your blood pressure should be checked at least every 1 to 2 years. Ongoing high blood pressure should be treated with medicines if weight loss and exercise do not work.  If you are 3-86 years old, ask your health care provider if you should take aspirin to prevent strokes.  Diabetes screening involves taking a blood sample to check your fasting blood sugar level. This should be done once every 3 years, after age 67, if you are within normal weight and without risk factors for diabetes. Testing should be considered at a younger age or be carried out more frequently if you are overweight and have at least 1 risk factor for diabetes.  Breast cancer screening is essential preventive care for women. You should practice "breast self-awareness." This means understanding the normal appearance and feel of your breasts and may include breast self-examination. Any changes detected, no matter how small, should be reported to a health care provider. Women in their 8s and 30s should have a clinical breast exam (CBE) by a health care provider as part of a regular health exam every 1 to 3 years. After age 70, women should have a CBE every year. Starting at age 25, women should consider having a mammogram (breast X-ray test) every year. Women who have a family history of breast cancer should talk to their health care provider about genetic screening. Women at a high risk of breast cancer should talk to their health care providers about having an MRI and a mammogram every year.  Breast cancer gene (BRCA)-related cancer risk assessment is recommended for women who have family members with BRCA-related cancers. BRCA-related cancers include breast, ovarian, tubal, and peritoneal cancers. Having family members with these cancers may be associated with an increased risk for harmful changes (mutations) in the breast cancer genes BRCA1 and BRCA2. Results of the assessment will determine the need for genetic counseling and  BRCA1 and BRCA2 testing.  Routine pelvic exams to screen for cancer are no longer recommended for nonpregnant women who are considered low risk for cancer of the pelvic organs (ovaries, uterus, and vagina) and who do not have symptoms. Ask your health care provider if a screening pelvic exam is right for you.  If you have had past treatment for cervical cancer or a condition that could lead to cancer, you need Pap tests and screening for cancer for at least 20 years after your treatment. If Pap tests have been discontinued, your risk factors (such as having a new sexual partner) need to be reassessed to determine if screening should be resumed. Some women have medical problems that increase the chance of getting cervical cancer. In these cases, your health care provider may recommend more frequent screening and Pap tests.  The HPV test is an additional test that may be used for cervical cancer screening. The HPV test looks for the virus that can cause the cell changes on the cervix. The cells collected during the Pap test can be  tested for HPV. The HPV test could be used to screen women aged 30 years and older, and should be used in women of any age who have unclear Pap test results. After the age of 30, women should have HPV testing at the same frequency as a Pap test.  Colorectal cancer can be detected and often prevented. Most routine colorectal cancer screening begins at the age of 50 years and continues through age 75 years. However, your health care provider may recommend screening at an earlier age if you have risk factors for colon cancer. On a yearly basis, your health care provider may provide home test kits to check for hidden blood in the stool. Use of a small camera at the end of a tube, to directly examine the colon (sigmoidoscopy or colonoscopy), can detect the earliest forms of colorectal cancer. Talk to your health care provider about this at age 50, when routine screening begins. Direct  exam of the colon should be repeated every 5-10 years through age 75 years, unless early forms of pre-cancerous polyps or small growths are found.  People who are at an increased risk for hepatitis B should be screened for this virus. You are considered at high risk for hepatitis B if:  You were born in a country where hepatitis B occurs often. Talk with your health care provider about which countries are considered high risk.  Your parents were born in a high-risk country and you have not received a shot to protect against hepatitis B (hepatitis B vaccine).  You have HIV or AIDS.  You use needles to inject street drugs.  You live with, or have sex with, someone who has hepatitis B.  You get hemodialysis treatment.  You take certain medicines for conditions like cancer, organ transplantation, and autoimmune conditions.  Hepatitis C blood testing is recommended for all people born from 1945 through 1965 and any individual with known risks for hepatitis C.  Practice safe sex. Use condoms and avoid high-risk sexual practices to reduce the spread of sexually transmitted infections (STIs). STIs include gonorrhea, chlamydia, syphilis, trichomonas, herpes, HPV, and human immunodeficiency virus (HIV). Herpes, HIV, and HPV are viral illnesses that have no cure. They can result in disability, cancer, and death.  You should be screened for sexually transmitted illnesses (STIs) including gonorrhea and chlamydia if:  You are sexually active and are younger than 24 years.  You are older than 24 years and your health care provider tells you that you are at risk for this type of infection.  Your sexual activity has changed since you were last screened and you are at an increased risk for chlamydia or gonorrhea. Ask your health care provider if you are at risk.  If you are at risk of being infected with HIV, it is recommended that you take a prescription medicine daily to prevent HIV infection. This is  called preexposure prophylaxis (PrEP). You are considered at risk if:  You are a heterosexual woman, are sexually active, and are at increased risk for HIV infection.  You take drugs by injection.  You are sexually active with a partner who has HIV.  Talk with your health care provider about whether you are at high risk of being infected with HIV. If you choose to begin PrEP, you should first be tested for HIV. You should then be tested every 3 months for as long as you are taking PrEP.  Osteoporosis is a disease in which the bones lose minerals and strength   with aging. This can result in serious bone fractures or breaks. The risk of osteoporosis can be identified using a bone density scan. Women ages 65 years and over and women at risk for fractures or osteoporosis should discuss screening with their health care providers. Ask your health care provider whether you should take a calcium supplement or vitamin D to reduce the rate of osteoporosis.  Menopause can be associated with physical symptoms and risks. Hormone replacement therapy is available to decrease symptoms and risks. You should talk to your health care provider about whether hormone replacement therapy is right for you.  Use sunscreen. Apply sunscreen liberally and repeatedly throughout the day. You should seek shade when your shadow is shorter than you. Protect yourself by wearing long sleeves, pants, a wide-brimmed hat, and sunglasses year round, whenever you are outdoors.  Once a month, do a whole body skin exam, using a mirror to look at the skin on your back. Tell your health care provider of new moles, moles that have irregular borders, moles that are larger than a pencil eraser, or moles that have changed in shape or color.  Stay current with required vaccines (immunizations).  Influenza vaccine. All adults should be immunized every year.  Tetanus, diphtheria, and acellular pertussis (Td, Tdap) vaccine. Pregnant women should  receive 1 dose of Tdap vaccine during each pregnancy. The dose should be obtained regardless of the length of time since the last dose. Immunization is preferred during the 27th-36th week of gestation. An adult who has not previously received Tdap or who does not know her vaccine status should receive 1 dose of Tdap. This initial dose should be followed by tetanus and diphtheria toxoids (Td) booster doses every 10 years. Adults with an unknown or incomplete history of completing a 3-dose immunization series with Td-containing vaccines should begin or complete a primary immunization series including a Tdap dose. Adults should receive a Td booster every 10 years.  Varicella vaccine. An adult without evidence of immunity to varicella should receive 2 doses or a second dose if she has previously received 1 dose. Pregnant females who do not have evidence of immunity should receive the first dose after pregnancy. This first dose should be obtained before leaving the health care facility. The second dose should be obtained 4-8 weeks after the first dose.  Human papillomavirus (HPV) vaccine. Females aged 13-26 years who have not received the vaccine previously should obtain the 3-dose series. The vaccine is not recommended for use in pregnant females. However, pregnancy testing is not needed before receiving a dose. If a female is found to be pregnant after receiving a dose, no treatment is needed. In that case, the remaining doses should be delayed until after the pregnancy. Immunization is recommended for any person with an immunocompromised condition through the age of 26 years if she did not get any or all doses earlier. During the 3-dose series, the second dose should be obtained 4-8 weeks after the first dose. The third dose should be obtained 24 weeks after the first dose and 16 weeks after the second dose.  Zoster vaccine. One dose is recommended for adults aged 60 years or older unless certain conditions are  present.  Measles, mumps, and rubella (MMR) vaccine. Adults born before 1957 generally are considered immune to measles and mumps. Adults born in 1957 or later should have 1 or more doses of MMR vaccine unless there is a contraindication to the vaccine or there is laboratory evidence of immunity to   each of the three diseases. A routine second dose of MMR vaccine should be obtained at least 28 days after the first dose for students attending postsecondary schools, health care workers, or international travelers. People who received inactivated measles vaccine or an unknown type of measles vaccine during 1963-1967 should receive 2 doses of MMR vaccine. People who received inactivated mumps vaccine or an unknown type of mumps vaccine before 1979 and are at high risk for mumps infection should consider immunization with 2 doses of MMR vaccine. For females of childbearing age, rubella immunity should be determined. If there is no evidence of immunity, females who are not pregnant should be vaccinated. If there is no evidence of immunity, females who are pregnant should delay immunization until after pregnancy. Unvaccinated health care workers born before 1957 who lack laboratory evidence of measles, mumps, or rubella immunity or laboratory confirmation of disease should consider measles and mumps immunization with 2 doses of MMR vaccine or rubella immunization with 1 dose of MMR vaccine.  Pneumococcal 13-valent conjugate (PCV13) vaccine. When indicated, a person who is uncertain of her immunization history and has no record of immunization should receive the PCV13 vaccine. An adult aged 19 years or older who has certain medical conditions and has not been previously immunized should receive 1 dose of PCV13 vaccine. This PCV13 should be followed with a dose of pneumococcal polysaccharide (PPSV23) vaccine. The PPSV23 vaccine dose should be obtained at least 8 weeks after the dose of PCV13 vaccine. An adult aged 19  years or older who has certain medical conditions and previously received 1 or more doses of PPSV23 vaccine should receive 1 dose of PCV13. The PCV13 vaccine dose should be obtained 1 or more years after the last PPSV23 vaccine dose.  Pneumococcal polysaccharide (PPSV23) vaccine. When PCV13 is also indicated, PCV13 should be obtained first. All adults aged 65 years and older should be immunized. An adult younger than age 65 years who has certain medical conditions should be immunized. Any person who resides in a nursing home or long-term care facility should be immunized. An adult smoker should be immunized. People with an immunocompromised condition and certain other conditions should receive both PCV13 and PPSV23 vaccines. People with human immunodeficiency virus (HIV) infection should be immunized as soon as possible after diagnosis. Immunization during chemotherapy or radiation therapy should be avoided. Routine use of PPSV23 vaccine is not recommended for American Indians, [place] Natives, or people younger than 65 years unless there are medical conditions that require PPSV23 vaccine. When indicated, people who have unknown immunization and have no record of immunization should receive PPSV23 vaccine. One-time revaccination 5 years after the first dose of PPSV23 is recommended for people aged 19-64 years who have chronic kidney failure, nephrotic syndrome, asplenia, or immunocompromised conditions. People who received 1-2 doses of PPSV23 before age 65 years should receive another dose of PPSV23 vaccine at age 65 years or later if at least 5 years have passed since the previous dose. Doses of PPSV23 are not needed for people immunized with PPSV23 at or after age 65 years.  Meningococcal vaccine. Adults with asplenia or persistent complement component deficiencies should receive 2 doses of quadrivalent meningococcal conjugate (MenACWY-D) vaccine. The doses should be obtained at least 2 months apart.  Microbiologists working with certain meningococcal bacteria, military recruits, people at risk during an outbreak, and people who travel to or live in countries with a high rate of meningitis should be immunized. A first-year college student up through age   21 years who is living in a residence hall should receive a dose if she did not receive a dose on or after her 16th birthday. Adults who have certain high-risk conditions should receive one or more doses of vaccine.  Hepatitis A vaccine. Adults who wish to be protected from this disease, have certain high-risk conditions, work with hepatitis A-infected animals, work in hepatitis A research labs, or travel to or work in countries with a high rate of hepatitis A should be immunized. Adults who were previously unvaccinated and who anticipate close contact with an international adoptee during the first 60 days after arrival in the [place] from a country with a high rate of hepatitis A should be immunized.  Hepatitis B vaccine. Adults who wish to be protected from this disease, have certain high-risk conditions, may be exposed to blood or other infectious body fluids, are household contacts or sex partners of hepatitis B positive people, are clients or workers in certain care facilities, or travel to or work in countries with a high rate of hepatitis B should be immunized.  Haemophilus influenzae type b (Hib) vaccine. A previously unvaccinated person with asplenia or sickle cell disease or having a scheduled splenectomy should receive 1 dose of Hib vaccine. Regardless of previous immunization, a recipient of a hematopoietic stem cell transplant should receive a 3-dose series 6-12 months after her successful transplant. Hib vaccine is not recommended for adults with HIV infection. Preventive Services / Frequency Ages 64 to 68 years  Blood pressure check.** / Every 1 to 2 years.  Lipid and cholesterol check.** / Every 5 years beginning at age  22.  Clinical breast exam.** / Every 3 years for women in their 88s and 53s.  BRCA-related cancer risk assessment.** / For women who have family members with a BRCA-related cancer (breast, ovarian, tubal, or peritoneal cancers).  Pap test.** / Every 2 years from ages 90 through 51. Every 3 years starting at age 21 through age 56 or 3 with a history of 3 consecutive normal Pap tests.  HPV screening.** / Every 3 years from ages 24 through ages 1 to 46 with a history of 3 consecutive normal Pap tests.  Hepatitis C blood test.** / For any individual with known risks for hepatitis C.  Skin self-exam. / Monthly.  Influenza vaccine. / Every year.  Tetanus, diphtheria, and acellular pertussis (Tdap, Td) vaccine.** / Consult your health care provider. Pregnant women should receive 1 dose of Tdap vaccine during each pregnancy. 1 dose of Td every 10 years.  Varicella vaccine.** / Consult your health care provider. Pregnant females who do not have evidence of immunity should receive the first dose after pregnancy.  HPV vaccine. / 3 doses over 6 months, if 72 and younger. The vaccine is not recommended for use in pregnant females. However, pregnancy testing is not needed before receiving a dose.  Measles, mumps, rubella (MMR) vaccine.** / You need at least 1 dose of MMR if you were born in 1957 or later. You may also need a 2nd dose. For females of childbearing age, rubella immunity should be determined. If there is no evidence of immunity, females who are not pregnant should be vaccinated. If there is no evidence of immunity, females who are pregnant should delay immunization until after pregnancy.  Pneumococcal 13-valent conjugate (PCV13) vaccine.** / Consult your health care provider.  Pneumococcal polysaccharide (PPSV23) vaccine.** / 1 to 2 doses if you smoke cigarettes or if you have certain conditions.  Meningococcal vaccine.** /  1 dose if you are age 19 to 21 years and a first-year college  student living in a residence hall, or have one of several medical conditions, you need to get vaccinated against meningococcal disease. You may also need additional booster doses.  Hepatitis A vaccine.** / Consult your health care provider.  Hepatitis B vaccine.** / Consult your health care provider.  Haemophilus influenzae type b (Hib) vaccine.** / Consult your health care provider. Ages 40 to 64 years  Blood pressure check.** / Every 1 to 2 years.  Lipid and cholesterol check.** / Every 5 years beginning at age 20 years.  Lung cancer screening. / Every year if you are aged 55-80 years and have a 30-pack-year history of smoking and currently smoke or have quit within the past 15 years. Yearly screening is stopped once you have quit smoking for at least 15 years or develop a health problem that would prevent you from having lung cancer treatment.  Clinical breast exam.** / Every year after age 40 years.  BRCA-related cancer risk assessment.** / For women who have family members with a BRCA-related cancer (breast, ovarian, tubal, or peritoneal cancers).  Mammogram.** / Every year beginning at age 40 years and continuing for as long as you are in good health. Consult with your health care provider.  Pap test.** / Every 3 years starting at age 30 years through age 65 or 70 years with a history of 3 consecutive normal Pap tests.  HPV screening.** / Every 3 years from ages 30 years through ages 65 to 70 years with a history of 3 consecutive normal Pap tests.  Fecal occult blood test (FOBT) of stool. / Every year beginning at age 50 years and continuing until age 75 years. You may not need to do this test if you get a colonoscopy every 10 years.  Flexible sigmoidoscopy or colonoscopy.** / Every 5 years for a flexible sigmoidoscopy or every 10 years for a colonoscopy beginning at age 50 years and continuing until age 75 years.  Hepatitis C blood test.** / For all people born from 1945 through  1965 and any individual with known risks for hepatitis C.  Skin self-exam. / Monthly.  Influenza vaccine. / Every year.  Tetanus, diphtheria, and acellular pertussis (Tdap/Td) vaccine.** / Consult your health care provider. Pregnant women should receive 1 dose of Tdap vaccine during each pregnancy. 1 dose of Td every 10 years.  Varicella vaccine.** / Consult your health care provider. Pregnant females who do not have evidence of immunity should receive the first dose after pregnancy.  Zoster vaccine.** / 1 dose for adults aged 60 years or older.  Measles, mumps, rubella (MMR) vaccine.** / You need at least 1 dose of MMR if you were born in 1957 or later. You may also need a 2nd dose. For females of childbearing age, rubella immunity should be determined. If there is no evidence of immunity, females who are not pregnant should be vaccinated. If there is no evidence of immunity, females who are pregnant should delay immunization until after pregnancy.  Pneumococcal 13-valent conjugate (PCV13) vaccine.** / Consult your health care provider.  Pneumococcal polysaccharide (PPSV23) vaccine.** / 1 to 2 doses if you smoke cigarettes or if you have certain conditions.  Meningococcal vaccine.** / Consult your health care provider.  Hepatitis A vaccine.** / Consult your health care provider.  Hepatitis B vaccine.** / Consult your health care provider.  Haemophilus influenzae type b (Hib) vaccine.** / Consult your health care provider. Ages 65   years and over  Blood pressure check.** / Every 1 to 2 years.  Lipid and cholesterol check.** / Every 5 years beginning at age 20 years.  Lung cancer screening. / Every year if you are aged 55-80 years and have a 30-pack-year history of smoking and currently smoke or have quit within the past 15 years. Yearly screening is stopped once you have quit smoking for at least 15 years or develop a health problem that would prevent you from having lung cancer  treatment.  Clinical breast exam.** / Every year after age 40 years.  BRCA-related cancer risk assessment.** / For women who have family members with a BRCA-related cancer (breast, ovarian, tubal, or peritoneal cancers).  Mammogram.** / Every year beginning at age 40 years and continuing for as long as you are in good health. Consult with your health care provider.  Pap test.** / Every 3 years starting at age 30 years through age 65 or 70 years with 3 consecutive normal Pap tests. Testing can be stopped between 65 and 70 years with 3 consecutive normal Pap tests and no abnormal Pap or HPV tests in the past 10 years.  HPV screening.** / Every 3 years from ages 30 years through ages 65 or 70 years with a history of 3 consecutive normal Pap tests. Testing can be stopped between 65 and 70 years with 3 consecutive normal Pap tests and no abnormal Pap or HPV tests in the past 10 years.  Fecal occult blood test (FOBT) of stool. / Every year beginning at age 50 years and continuing until age 75 years. You may not need to do this test if you get a colonoscopy every 10 years.  Flexible sigmoidoscopy or colonoscopy.** / Every 5 years for a flexible sigmoidoscopy or every 10 years for a colonoscopy beginning at age 50 years and continuing until age 75 years.  Hepatitis C blood test.** / For all people born from 1945 through 1965 and any individual with known risks for hepatitis C.  Osteoporosis screening.** / A one-time screening for women ages 65 years and over and women at risk for fractures or osteoporosis.  Skin self-exam. / Monthly.  Influenza vaccine. / Every year.  Tetanus, diphtheria, and acellular pertussis (Tdap/Td) vaccine.** / 1 dose of Td every 10 years.  Varicella vaccine.** / Consult your health care provider.  Zoster vaccine.** / 1 dose for adults aged 60 years or older.  Pneumococcal 13-valent conjugate (PCV13) vaccine.** / Consult your health care provider.  Pneumococcal  polysaccharide (PPSV23) vaccine.** / 1 dose for all adults aged 65 years and older.  Meningococcal vaccine.** / Consult your health care provider.  Hepatitis A vaccine.** / Consult your health care provider.  Hepatitis B vaccine.** / Consult your health care provider.  Haemophilus influenzae type b (Hib) vaccine.** / Consult your health care provider.   Health Maintenance Adopting a healthy lifestyle and getting preventive care can go a long way to promote health and wellness. Talk with your health care provider about what schedule of regular examinations is right for you. This is a good chance for you to check in with your provider about disease prevention and staying healthy. In between checkups, there are plenty of things you can do on your own. Experts have done a lot of research about which lifestyle changes and preventive measures are most likely to keep you healthy. Ask your health care provider for more information. WEIGHT AND DIET  Eat a healthy diet Be sure to include plenty of vegetables,   fruits, low-fat dairy products, and lean protein. Do not eat a lot of foods high in solid fats, added sugars, or salt. Get regular exercise. This is one of the most important things you can do for your health. Most adults should exercise for at least 150 minutes each week. The exercise should increase your heart rate and make you sweat (moderate-intensity exercise). Most adults should also do strengthening exercises at least twice a week. This is in addition to the moderate-intensity exercise.  Maintain a healthy weight Body mass index (BMI) is a measurement that can be used to identify possible weight problems. It estimates body fat based on height and weight. Your health care provider can help determine your BMI and help you achieve or maintain a healthy weight. For females 20 years of age and older:  A BMI below 18.5 is considered underweight. A BMI of 18.5 to 24.9 is normal. A BMI of 25 to  29.9 is considered overweight. A BMI of 30 and above is considered obese.  Watch levels of cholesterol and blood lipids You should start having your blood tested for lipids and cholesterol at 81 years of age, then have this test every 5 years. You may need to have your cholesterol levels checked more often if: Your lipid or cholesterol levels are high. You are older than 81 years of age. You are at high risk for heart disease.  CANCER SCREENING   Lung Cancer Lung cancer screening is recommended for adults 55-80 years old who are at high risk for lung cancer because of a history of smoking. A yearly low-dose CT scan of the lungs is recommended for people who: Currently smoke. Have quit within the past 15 years. Have at least a 30-pack-year history of smoking. A pack year is smoking an average of one pack of cigarettes a day for 1 year. Yearly screening should continue until it has been 15 years since you quit. Yearly screening should stop if you develop a health problem that would prevent you from having lung cancer treatment.  Breast Cancer Practice breast self-awareness. This means understanding how your breasts normally appear and feel. It also means doing regular breast self-exams. Let your health care provider know about any changes, no matter how small. If you are in your 20s or 30s, you should have a clinical breast exam (CBE) by a health care provider every 1-3 years as part of a regular health exam. If you are 40 or older, have a CBE every year. Also consider having a breast X-ray (mammogram) every year. If you have a family history of breast cancer, talk to your health care provider about genetic screening. If you are at high risk for breast cancer, talk to your health care provider about having an MRI and a mammogram every year. Breast cancer gene (BRCA) assessment is recommended for women who have family members with BRCA-related cancers. BRCA-related cancers  include: Breast. Ovarian. Tubal. Peritoneal cancers. Results of the assessment will determine the need for genetic counseling and BRCA1 and BRCA2 testing. Cervical Cancer Routine pelvic examinations to screen for cervical cancer are no longer recommended for nonpregnant women who are considered low risk for cancer of the pelvic organs (ovaries, uterus, and vagina) and who do not have symptoms. A pelvic examination may be necessary if you have symptoms including those associated with pelvic infections. Ask your health care provider if a screening pelvic exam is right for you.  The Pap test is the screening test for cervical cancer for   women who are considered at risk. If you had a hysterectomy for a problem that was not cancer or a condition that could lead to cancer, then you no longer need Pap tests. If you are older than 65 years, and you have had normal Pap tests for the past 10 years, you no longer need to have Pap tests. If you have had past treatment for cervical cancer or a condition that could lead to cancer, you need Pap tests and screening for cancer for at least 20 years after your treatment. If you no longer get a Pap test, assess your risk factors if they change (such as having a new sexual partner). This can affect whether you should start being screened again. Some women have medical problems that increase their chance of getting cervical cancer. If this is the case for you, your health care provider may recommend more frequent screening and Pap tests. The human papillomavirus (HPV) test is another test that may be used for cervical cancer screening. The HPV test looks for the virus that can cause cell changes in the cervix. The cells collected during the Pap test can be tested for HPV. The HPV test can be used to screen women 72 years of age and older. Getting tested for HPV can extend the interval between normal Pap tests from three to five years. An HPV test also should be used to  screen women of any age who have unclear Pap test results. After 81 years of age, women should have HPV testing as often as Pap tests.  Colorectal Cancer This type of cancer can be detected and often prevented. Routine colorectal cancer screening usually begins at 81 years of age and continues through 81 years of age. Your health care provider may recommend screening at an earlier age if you have risk factors for colon cancer. Your health care provider may also recommend using home test kits to check for hidden blood in the stool. A small camera at the end of a tube can be used to examine your colon directly (sigmoidoscopy or colonoscopy). This is done to check for the earliest forms of colorectal cancer. Routine screening usually begins at age 58. Direct examination of the colon should be repeated every 5-10 years through 81 years of age. However, you may need to be screened more often if early forms of precancerous polyps or small growths are found. Skin Cancer Check your skin from head to toe regularly. Tell your health care provider about any new moles or changes in moles, especially if there is a change in a mole's shape or color. Also tell your health care provider if you have a mole that is larger than the size of a pencil eraser. Always use sunscreen. Apply sunscreen liberally and repeatedly throughout the day. Protect yourself by wearing long sleeves, pants, a wide-brimmed hat, and sunglasses whenever you are outside. HEART DISEASE, DIABETES, AND HIGH BLOOD PRESSURE  Have your blood pressure checked at least every 1-2 years. High blood pressure causes heart disease and increases the risk of stroke. If you are between 61 years and 77 years old, ask your health care provider if you should take aspirin to prevent strokes. Have regular diabetes screenings. This involves taking a blood sample to check your fasting blood sugar level. If you are at a normal weight and have a low risk for  diabetes, have this test once every three years after 81 years of age. If you are overweight and have a high risk  for diabetes, consider being tested at a younger age or more often. PREVENTING INFECTION  Hepatitis B If you have a higher risk for hepatitis B, you should be screened for this virus. You are considered at high risk for hepatitis B if: You were born in a country where hepatitis B is common. Ask your health care provider which countries are considered high risk. Your parents were born in a high-risk country, and you have not been immunized against hepatitis B (hepatitis B vaccine). You have HIV or AIDS. You use needles to inject street drugs. You live with someone who has hepatitis B. You have had sex with someone who has hepatitis B. You get hemodialysis treatment. You take certain medicines for conditions, including cancer, organ transplantation, and autoimmune conditions. Hepatitis C Blood testing is recommended for: Everyone born from 1945 through 1965. Anyone with known risk factors for hepatitis C. Sexually transmitted infections (STIs) You should be screened for sexually transmitted infections (STIs) including gonorrhea and chlamydia if: You are sexually active and are younger than 81 years of age. You are older than 81 years of age and your health care provider tells you that you are at risk for this type of infection. Your sexual activity has changed since you were last screened and you are at an increased risk for chlamydia or gonorrhea. Ask your health care provider if you are at risk. If you do not have HIV, but are at risk, it may be recommended that you take a prescription medicine daily to prevent HIV infection. This is called pre-exposure prophylaxis (PrEP). You are considered at risk if: You are sexually active and do not regularly use condoms or know the HIV status of your partner(s). You take drugs by injection. You are sexually active with a partner who has  HIV. Talk with your health care provider about whether you are at high risk of being infected with HIV. If you choose to begin PrEP, you should first be tested for HIV. You should then be tested every 3 months for as long as you are taking PrEP.  PREGNANCY  If you are premenopausal and you may become pregnant, ask your health care provider about preconception counseling. If you may become pregnant, take 400 to 800 micrograms (mcg) of folic acid every day. If you want to prevent pregnancy, talk to your health care provider about birth control (contraception). OSTEOPOROSIS AND MENOPAUSE  Osteoporosis is a disease in which the bones lose minerals and strength with aging. This can result in serious bone fractures. Your risk for osteoporosis can be identified using a bone density scan. If you are 65 years of age or older, or if you are at risk for osteoporosis and fractures, ask your health care provider if you should be screened. Ask your health care provider whether you should take a calcium or vitamin D supplement to lower your risk for osteoporosis. Menopause may have certain physical symptoms and risks. Hormone replacement therapy may reduce some of these symptoms and risks. Talk to your health care provider about whether hormone replacement therapy is right for you.  HOME CARE INSTRUCTIONS  Schedule regular health, dental, and eye exams. Stay current with your immunizations.  Do not use any tobacco products including cigarettes, chewing tobacco, or electronic cigarettes. If you are pregnant, do not drink alcohol. If you are breastfeeding, limit how much and how often you drink alcohol. Limit alcohol intake to no more than 1 drink per day for nonpregnant women. One drink equals 12   ounces of beer, 5 ounces of wine, or 1 ounces of hard liquor. Do not use street drugs. Do not share needles. Ask your health care provider for help if you need support or information about quitting drugs. Tell your  health care provider if you often feel depressed. Tell your health care provider if you have ever been abused or do not feel safe at home

## 2017-03-02 NOTE — Progress Notes (Signed)
[facility] Unk Pinto, M.D.      Uvaldo Bristle. Silverio Lay, P.A.-C [facility]                [place], [place] [address] Telephone [phone] Telefax [phone]  Annual Screening/Preventative Visit & Comprehensive Evaluation &  Examination     This very nice 83 y.o. [place] accompanied by her daughter presents for a Screening/Preventative Visit & comprehensive evaluation and management of multiple medical co-morbidities.  Patient has been followed for HTN, Prediabetes, Hyperlipidemia and Vitamin D Deficiency.      HTN predates many years. Patient's BP has been controlled at home and patient denies any cardiac symptoms as chest pain, palpitations, shortness of breath, dizziness or ankle swelling. In Sept 2014, she had a TIA and negative Head CT and MRI/MRA.  In May 2016 she had a PPM implanted for tachybrady syndrome by Dr [name]. In July 2016 she was started on Coumadin for pAfib and is followed at the [facility] clinic. Today's BP is sl elevated at 150/72.      Patient's hyperlipidemia is controlled with diet and medications. Patient denies myalgias or other medication SE's. Last lipids were at [facility] albeit elevated Trig's: Lab Results  Component Value Date   CHOL 147 02/03/2016   HDL 45 (L) 02/03/2016   LDLCALC 47 02/03/2016   TRIG 273 (H) 02/03/2016   CHOLHDL 3.3 02/03/2016      Patient has prediabetes (A1c 5.7% in 2012)   and patient denies reactive hypoglycemic symptoms, visual blurring, diabetic polys, or paresthesias. Last A1c was at goal: Lab Results  Component Value Date   HGBA1C 5.4 02/03/2016      Finally, patient has history of Vitamin D Deficiency ("18" in 2010) and last Vitamin D was sl low (goal 70-100): Lab Results  Component Value Date   VD25OH 51 02/03/2016   Current Outpatient Prescriptions on File Prior to Visit  Medication Sig  . acetaminophen (TYLENOL) 325 MG  tablet Take 325 mg by mouth every 6 (six) hours as needed for mild pain.   Marland Kitchen b complex vitamins tablet Take 1 tablet by mouth daily.  . Cholecalciferol (VITAMIN D PO) Take 3,000 Units by mouth daily.   Marland Kitchen diltiazem (CARDIZEM) 30 MG tablet Take 1 tablet (30 mg total) by mouth daily as needed. For Afib episodes  . FLUZONE HIGH-DOSE 0.5 ML SUSY TO BE ADMINISTERED BY PHARMACIST FOR IMMUNIZATION  . GLUCOSAMINE-CHONDROITIN PO Take 1 tablet by mouth daily.  Marland Kitchen MAGNESIUM OXIDE, ANTACID, PO Take 1 tablet by mouth daily.  . metoprolol tartrate (LOPRESSOR) 25 MG tablet Take 3 tablets (75 mg) by mouth twice daily  . OVER THE COUNTER MEDICATION Take 1 tablet by mouth daily. Preserve vision vitamin  . polyethylene glycol (MIRALAX / GLYCOLAX) packet Take 17 g by mouth daily as needed (constipation).  . warfarin (COUMADIN) 5 MG tablet Take 1 to 1.5 tablets by mouth daily as directed by Coumadin Clinic   Allergies  Allergen Reactions  . Other Other (See Comments)    News Print: (freshly printed newspapers) cause her eyes to water . Allergy type reaction. Medal: itching & rash   Past Medical History:  Diagnosis Date  . A-fib ([place])    a. Dx 06/2013;  b. 06/2013 Echo:  EF 55-60%, no reg wma, mild AI, mod dil LA.  Marland Kitchen Allergy   . GERD (  gastroesophageal reflux disease)   . Hyperlipidemia   . RA (rheumatoid arthritis) ([place])    " in my hands "  . Sinus bradycardia 07/11/2013  . TIA (transient ischemic attack)    a. with fall 05/2013 -> neg head CT and MRI/MRA;  b. Plavix started.  . Urinary incontinence   . Vitamin D deficiency    Health Maintenance  Topic Date Due  . DEXA SCAN  08/02/1989  . PNA vac Low Risk Adult (2 of 2 - PCV13) 11/25/2009  . INFLUENZA VACCINE  04/27/2017  . TETANUS/TDAP  12/12/2025   Immunization History  Administered Date(s) Administered  . Influenza, High Dose Seasonal PF 07/18/2014  . Influenza,inj,Quad PF,36+ Mos 07/26/2013, 07/07/2016  . Influenza-Unspecified 05/28/2012  .  Pneumococcal-Unspecified 11/25/2008  . Td 09/13/2006  . Tdap 12/13/2015   Past Surgical History:  Procedure Laterality Date  . APPENDECTOMY  1936  . EP IMPLANTABLE DEVICE N/A 02/12/2015   Procedure: Pacemaker Implant;  Surgeon: Deboraha Sprang, MD;  Location: [place] [facility];  Service: Cardiovascular;  Laterality: N/A;  . EYE SURGERY Right 1994   CE/IOL  . EYE SURGERY Left 1998   CE/IOL   Family History  Problem Relation Age of Onset  . Heart disease Mother   . Heart disease Sister   . Heart disease Brother   . Heart disease Brother   . CVA Sister   . Thyroid disease Sister    Social History  Substance Use Topics  . Smoking status: Never Smoker  . Smokeless tobacco: Never Used  . Alcohol use 0.0 oz/week     Comment: ocassional-rarely    ROS Constitutional: Denies fever, chills, weight loss/gain, headaches, insomnia,  night sweats, and change in appetite. Does c/o fatigue. Eyes: Denies redness, blurred vision, diplopia, discharge, itchy, watery eyes.  ENT: Denies discharge, congestion, post nasal drip, epistaxis, sore throat, earache, hearing loss, dental pain, Tinnitus, Vertigo, Sinus pain, snoring.  Cardio: Denies chest pain, palpitations, irregular heartbeat, syncope, dyspnea, diaphoresis, orthopnea, PND, claudication, edema Respiratory: denies cough, dyspnea, DOE, pleurisy, hoarseness, laryngitis, wheezing.  Gastrointestinal: Denies dysphagia, heartburn, reflux, water brash, pain, cramps, nausea, vomiting, bloating, diarrhea, constipation, hematemesis, melena, hematochezia, jaundice, hemorrhoids Genitourinary: Denies dysuria, frequency, urgency, nocturia, hesitancy, discharge, hematuria, flank pain Breast: Breast lumps, nipple discharge, bleeding.  Musculoskeletal: Denies arthralgia, myalgia, stiffness, Jt. Swelling, pain, limp, and strain/sprain. Denies falls. Skin: Denies puritis, rash, hives, warts, acne, eczema, changing in skin lesion Neuro: No weakness, tremor,  incoordination, spasms, paresthesia, pain Psychiatric: Denies confusion, memory loss, sensory loss. Denies Depression. Endocrine: Denies change in weight, skin, hair change, nocturia, and paresthesia, diabetic polys, visual blurring, hyper / hypo glycemic episodes.  Heme/Lymph: No excessive bleeding, bruising, enlarged lymph nodes.  Physical Exam  BP (!) 150/72   Pulse 60   Temp 97.4 F (36.3 C)   Resp 16   Ht 5\' 3"  (1.6 m)   Wt 147 lb 6.4 oz (66.9 kg)   BMI 26.11 kg/m   General Appearance: Well nourished, well groomed and in no apparent distress.  Eyes: PERRLA, EOMs, conjunctiva no swelling or erythema, normal fundi and vessels. Sinuses: No frontal/maxillary tenderness ENT/Mouth: EACs patent / TMs  nl. Nares clear without erythema, swelling, mucoid exudates. Oral hygiene is good. No erythema, swelling, or exudate. Tongue normal, non-obstructing. Tonsils not swollen or erythematous. Hearing normal.  Neck: Supple, thyroid normal. No bruits, nodes or JVD. Respiratory: Respiratory effort normal.  BS equal and clear bilateral without rales, rhonci, wheezing or stridor. Cardio: Heart sounds  are normal with regular rate and rhythm and no murmurs, rubs or gallops. Peripheral pulses are normal and equal bilaterally without edema. No aortic or femoral bruits. Chest: symmetric with normal excursions and percussion. Breasts: Symmetric, without lumps, nipple discharge, retractions, or fibrocystic changes.  Abdomen: Flat, soft with bowel sounds active. Nontender, no guarding, rebound, hernias, masses, or organomegaly.  Lymphatics: Non tender without lymphadenopathy.  Genitourinary:  Musculoskeletal: Full ROM all peripheral extremities, joint stability, 5/5 strength, and normal gait. Skin: Warm and dry without rashes, lesions, cyanosis, clubbing or  ecchymosis.  Neuro: Cranial nerves intact, reflexes equal bilaterally. Normal muscle tone, no cerebellar symptoms. Sensation intact.  Pysch: Alert and  oriented X 3, normal affect, Insight and Judgment concrete & limited..   Assessment and Plan  1. Annual Preventative Screening Examination   2. Essential hypertension  - EKG 12-Lead - Urinalysis, Routine w reflex microscopic - Microalbumin / creatinine urine ratio - CBC with Differential/Platelet - BASIC METABOLIC PANEL WITH GFR - Magnesium - TSH  3. Hyperlipidemia, mixed  - EKG 12-Lead - Hepatic function panel - Lipid panel - TSH  4. Prediabetes  - EKG 12-Lead - Hemoglobin A1c - Insulin, random  5. Vitamin D deficiency  - VITAMIN D 25 Hydroxy  6. Paroxysmal atrial fibrillation (HCC)  - EKG 12-Lead  7. Screening for ischemic heart disease   8. Medication management  - Urinalysis, Routine w reflex microscopic - Microalbumin / creatinine urine ratio - CBC with Differential/Platelet - BASIC METABOLIC PANEL WITH GFR - Hepatic function panel - Magnesium - Lipid panel - TSH - Hemoglobin A1c - Insulin, random - VITAMIN D 25 Hydroxy   9. Need for shingles vaccine  - Varicella zoster antibody, IgG  10. Need for prophylactic vaccination against Streptococcus pneumoniae (pneumococcus)  - Pneumococcal conjugate vaccine 13-valent       Patient was counseled in prudent diet to achieve/maintain BMI less than 25 for weight control, BP monitoring, regular exercise and medications. Discussed med's effects and SE's. Screening labs and tests as requested with regular follow-up as recommended. Over 40 minutes of exam, counseling, chart review and high complex critical decision making was performed.

## 2017-03-03 LAB — HEPATIC FUNCTION PANEL
ALBUMIN: 4.2 g/dL (ref 3.6–5.1)
ALT: 12 U/L (ref 6–29)
AST: 20 U/L (ref 10–35)
Alkaline Phosphatase: 50 U/L (ref 33–130)
BILIRUBIN TOTAL: 0.4 mg/dL (ref 0.2–1.2)
Bilirubin, Direct: 0.1 mg/dL (ref <=0.2)
Indirect Bilirubin: 0.3 mg/dL (ref 0.2–1.2)
Total Protein: 6.8 g/dL (ref 6.1–8.1)

## 2017-03-03 LAB — URINALYSIS, MICROSCOPIC ONLY
Bacteria, UA: NONE SEEN [HPF]
CRYSTALS: NONE SEEN [HPF]
Casts: NONE SEEN [LPF]
Yeast: NONE SEEN [HPF]

## 2017-03-03 LAB — BASIC METABOLIC PANEL WITH GFR
BUN: 15 mg/dL (ref 7–25)
CALCIUM: 9 mg/dL (ref 8.6–10.4)
CO2: 23 mmol/L (ref 20–31)
Chloride: 102 mmol/L (ref 98–110)
Creat: 1.31 mg/dL — ABNORMAL HIGH (ref 0.60–0.88)
GFR, EST AFRICAN AMERICAN: 41 mL/min — AB (ref >=60)
GFR, EST NON AFRICAN AMERICAN: 35 mL/min — AB (ref >=60)
Glucose, Bld: 88 mg/dL (ref 65–99)
Potassium: 4.3 mmol/L (ref 3.5–5.3)
Sodium: 137 mmol/L (ref 135–146)

## 2017-03-03 LAB — URINALYSIS, ROUTINE W REFLEX MICROSCOPIC
Bilirubin Urine: NEGATIVE
Glucose, UA: NEGATIVE
Hgb urine dipstick: NEGATIVE
Ketones, ur: NEGATIVE
Nitrite: NEGATIVE
PH: 6 (ref 5.0–8.0)
PROTEIN: NEGATIVE
SPECIFIC GRAVITY, URINE: 1.018 (ref 1.001–1.035)

## 2017-03-03 LAB — MICROALBUMIN / CREATININE URINE RATIO
Creatinine, Urine: 133 mg/dL (ref 20–320)
MICROALB/CREAT RATIO: 8 ug/mg{creat} (ref <30)
Microalb, Ur: 1 mg/dL

## 2017-03-03 LAB — HEMOGLOBIN A1C
Hgb A1c MFr Bld: 5.3 % (ref <5.7)
MEAN PLASMA GLUCOSE: 105 mg/dL

## 2017-03-03 LAB — MAGNESIUM: Magnesium: 1.9 mg/dL (ref 1.5–2.5)

## 2017-03-03 LAB — LIPID PANEL
CHOLESTEROL: 146 mg/dL (ref <200)
HDL: 39 mg/dL — AB (ref >50)
LDL CALC: 49 mg/dL (ref <100)
TRIGLYCERIDES: 290 mg/dL — AB (ref <150)
Total CHOL/HDL Ratio: 3.7 Ratio (ref <5.0)
VLDL: 58 mg/dL — ABNORMAL HIGH (ref <30)

## 2017-03-03 LAB — VARICELLA ZOSTER ANTIBODY, IGG: VARICELLA IGG: 984 {index} — AB (ref <135.00)

## 2017-03-03 LAB — TSH: TSH: 0.69 mIU/L

## 2017-03-03 LAB — VITAMIN D 25 HYDROXY (VIT D DEFICIENCY, FRACTURES): VIT D 25 HYDROXY: 50 ng/mL (ref 30–100)

## 2017-03-03 LAB — INSULIN, RANDOM: INSULIN: 6.3 u[IU]/mL (ref 2.0–19.6)

## 2017-03-08 ENCOUNTER — Other Ambulatory Visit: Payer: Self-pay | Admitting: Cardiology

## 2017-03-08 DIAGNOSIS — I480 Paroxysmal atrial fibrillation: Secondary | ICD-10-CM

## 2017-03-25 ENCOUNTER — Ambulatory Visit (INDEPENDENT_AMBULATORY_CARE_PROVIDER_SITE_OTHER): Payer: Medicare HMO

## 2017-03-25 DIAGNOSIS — G459 Transient cerebral ischemic attack, unspecified: Secondary | ICD-10-CM | POA: Diagnosis not present

## 2017-03-25 DIAGNOSIS — Z79899 Other long term (current) drug therapy: Secondary | ICD-10-CM | POA: Diagnosis not present

## 2017-03-25 DIAGNOSIS — I480 Paroxysmal atrial fibrillation: Secondary | ICD-10-CM

## 2017-03-25 DIAGNOSIS — Z7901 Long term (current) use of anticoagulants: Secondary | ICD-10-CM | POA: Diagnosis not present

## 2017-03-25 LAB — POCT INR: INR: 3

## 2017-04-06 ENCOUNTER — Other Ambulatory Visit: Payer: Self-pay | Admitting: *Deleted

## 2017-04-06 ENCOUNTER — Ambulatory Visit (INDEPENDENT_AMBULATORY_CARE_PROVIDER_SITE_OTHER): Payer: Medicare HMO | Admitting: *Deleted

## 2017-04-06 DIAGNOSIS — Z1211 Encounter for screening for malignant neoplasm of colon: Secondary | ICD-10-CM

## 2017-04-06 DIAGNOSIS — I495 Sick sinus syndrome: Secondary | ICD-10-CM

## 2017-04-06 LAB — POC HEMOCCULT BLD/STL (HOME/3-CARD/SCREEN)
Card #3 Fecal Occult Blood, POC: NEGATIVE
FECAL OCCULT BLD: NEGATIVE
Fecal Occult Blood, POC: NEGATIVE

## 2017-04-06 NOTE — Progress Notes (Signed)
Remote pacemaker transmission.   

## 2017-04-07 LAB — CUP PACEART REMOTE DEVICE CHECK
Brady Statistic AP VP Percent: 1 %
Brady Statistic AP VS Percent: 96 %
Brady Statistic AS VP Percent: 1 %
Brady Statistic AS VS Percent: 3.5 %
Brady Statistic RV Percent Paced: 1 %
Implantable Lead Implant Date: 20160518
Implantable Lead Location: 753859
Implantable Lead Model: 1944
Lead Channel Impedance Value: 690 Ohm
Lead Channel Pacing Threshold Amplitude: 0.75 V
Lead Channel Pacing Threshold Pulse Width: 0.4 ms
Lead Channel Sensing Intrinsic Amplitude: 0.4 mV
Lead Channel Sensing Intrinsic Amplitude: 9.1 mV
Lead Channel Setting Pacing Amplitude: 1 V
Lead Channel Setting Pacing Amplitude: 2 V
Lead Channel Setting Pacing Pulse Width: 0.4 ms
Lead Channel Setting Sensing Sensitivity: 2 mV
MDC IDC LEAD IMPLANT DT: 20160518
MDC IDC LEAD LOCATION: 753860
MDC IDC MSMT BATTERY REMAINING LONGEVITY: 130 mo
MDC IDC MSMT BATTERY REMAINING PERCENTAGE: 95.5 %
MDC IDC MSMT BATTERY VOLTAGE: 3.01 V
MDC IDC MSMT LEADCHNL RA IMPEDANCE VALUE: 550 Ohm
MDC IDC MSMT LEADCHNL RA PACING THRESHOLD AMPLITUDE: 0.5 V
MDC IDC MSMT LEADCHNL RA PACING THRESHOLD PULSEWIDTH: 0.4 ms
MDC IDC PG IMPLANT DT: 20160518
MDC IDC SESS DTM: 20180711060020
MDC IDC STAT BRADY RA PERCENT PACED: 95 %
Pulse Gen Serial Number: 7759914

## 2017-04-12 ENCOUNTER — Encounter: Payer: Self-pay | Admitting: Cardiology

## 2017-04-25 ENCOUNTER — Ambulatory Visit
Admission: RE | Admit: 2017-04-25 | Discharge: 2017-04-25 | Disposition: A | Discharge status: Home / Self Care | Payer: Medicare HMO | Source: Ambulatory Visit | Attending: Internal Medicine | Admitting: Internal Medicine

## 2017-04-25 ENCOUNTER — Other Ambulatory Visit: Payer: Self-pay | Admitting: Internal Medicine

## 2017-04-25 DIAGNOSIS — Z1231 Encounter for screening mammogram for malignant neoplasm of breast: Secondary | ICD-10-CM

## 2017-04-26 ENCOUNTER — Encounter: Payer: Self-pay | Admitting: Cardiology

## 2017-04-29 ENCOUNTER — Ambulatory Visit (INDEPENDENT_AMBULATORY_CARE_PROVIDER_SITE_OTHER): Payer: Medicare HMO | Admitting: Pharmacist

## 2017-04-29 DIAGNOSIS — Z79899 Other long term (current) drug therapy: Secondary | ICD-10-CM | POA: Diagnosis not present

## 2017-04-29 DIAGNOSIS — I480 Paroxysmal atrial fibrillation: Secondary | ICD-10-CM | POA: Diagnosis not present

## 2017-04-29 DIAGNOSIS — Z7901 Long term (current) use of anticoagulants: Secondary | ICD-10-CM

## 2017-04-29 DIAGNOSIS — G459 Transient cerebral ischemic attack, unspecified: Secondary | ICD-10-CM

## 2017-04-29 LAB — POCT INR: INR: 3.5

## 2017-05-05 IMAGING — CR DG CHEST 2V
2 series · 2 of 2 positions shown · non-contrast
Comparison: Portable chest x-ray February 12, 2015

CLINICAL DATA: Status post pacemaker placement, weakness.

EXAM:
CHEST  2 VIEW

[chest lat]
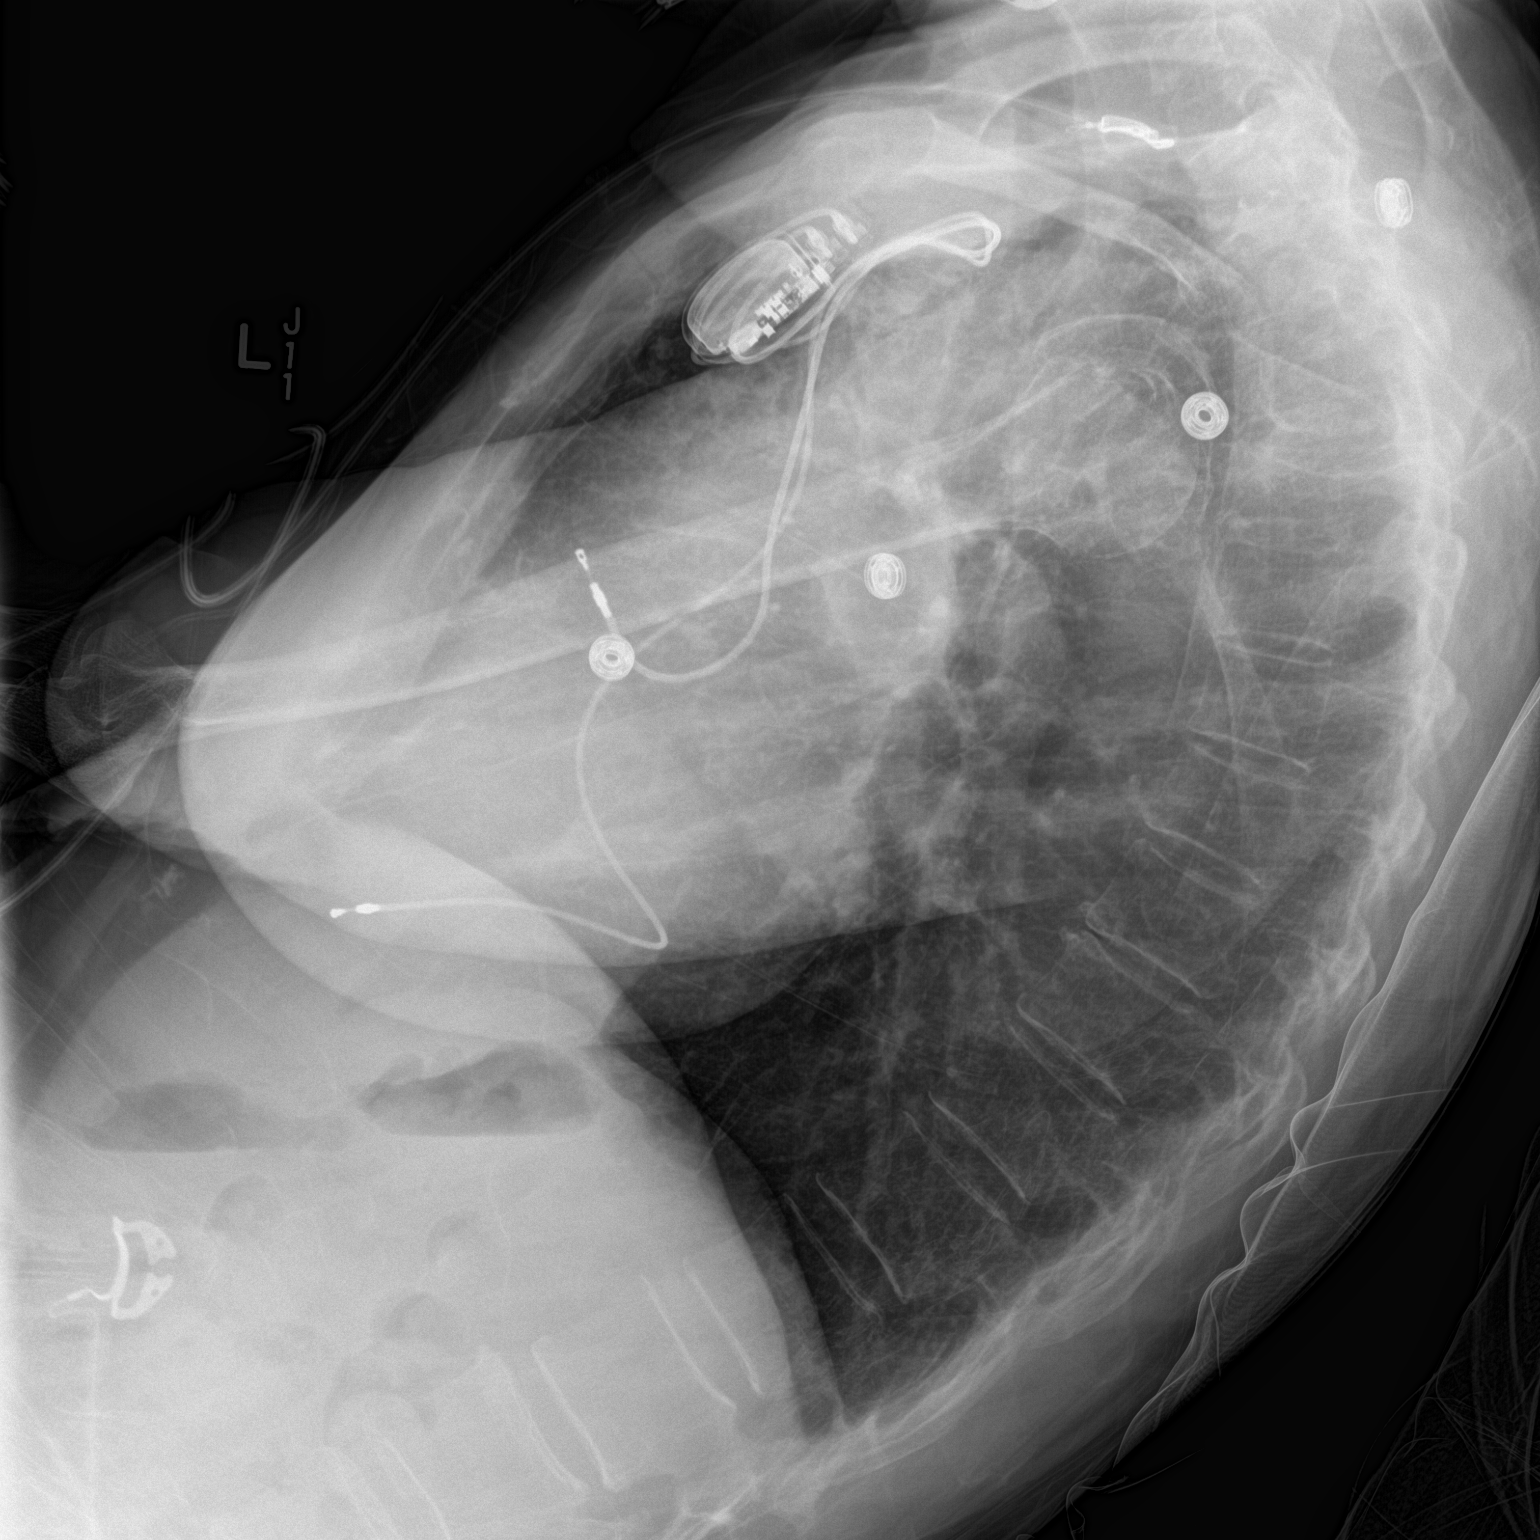

[chest ap]
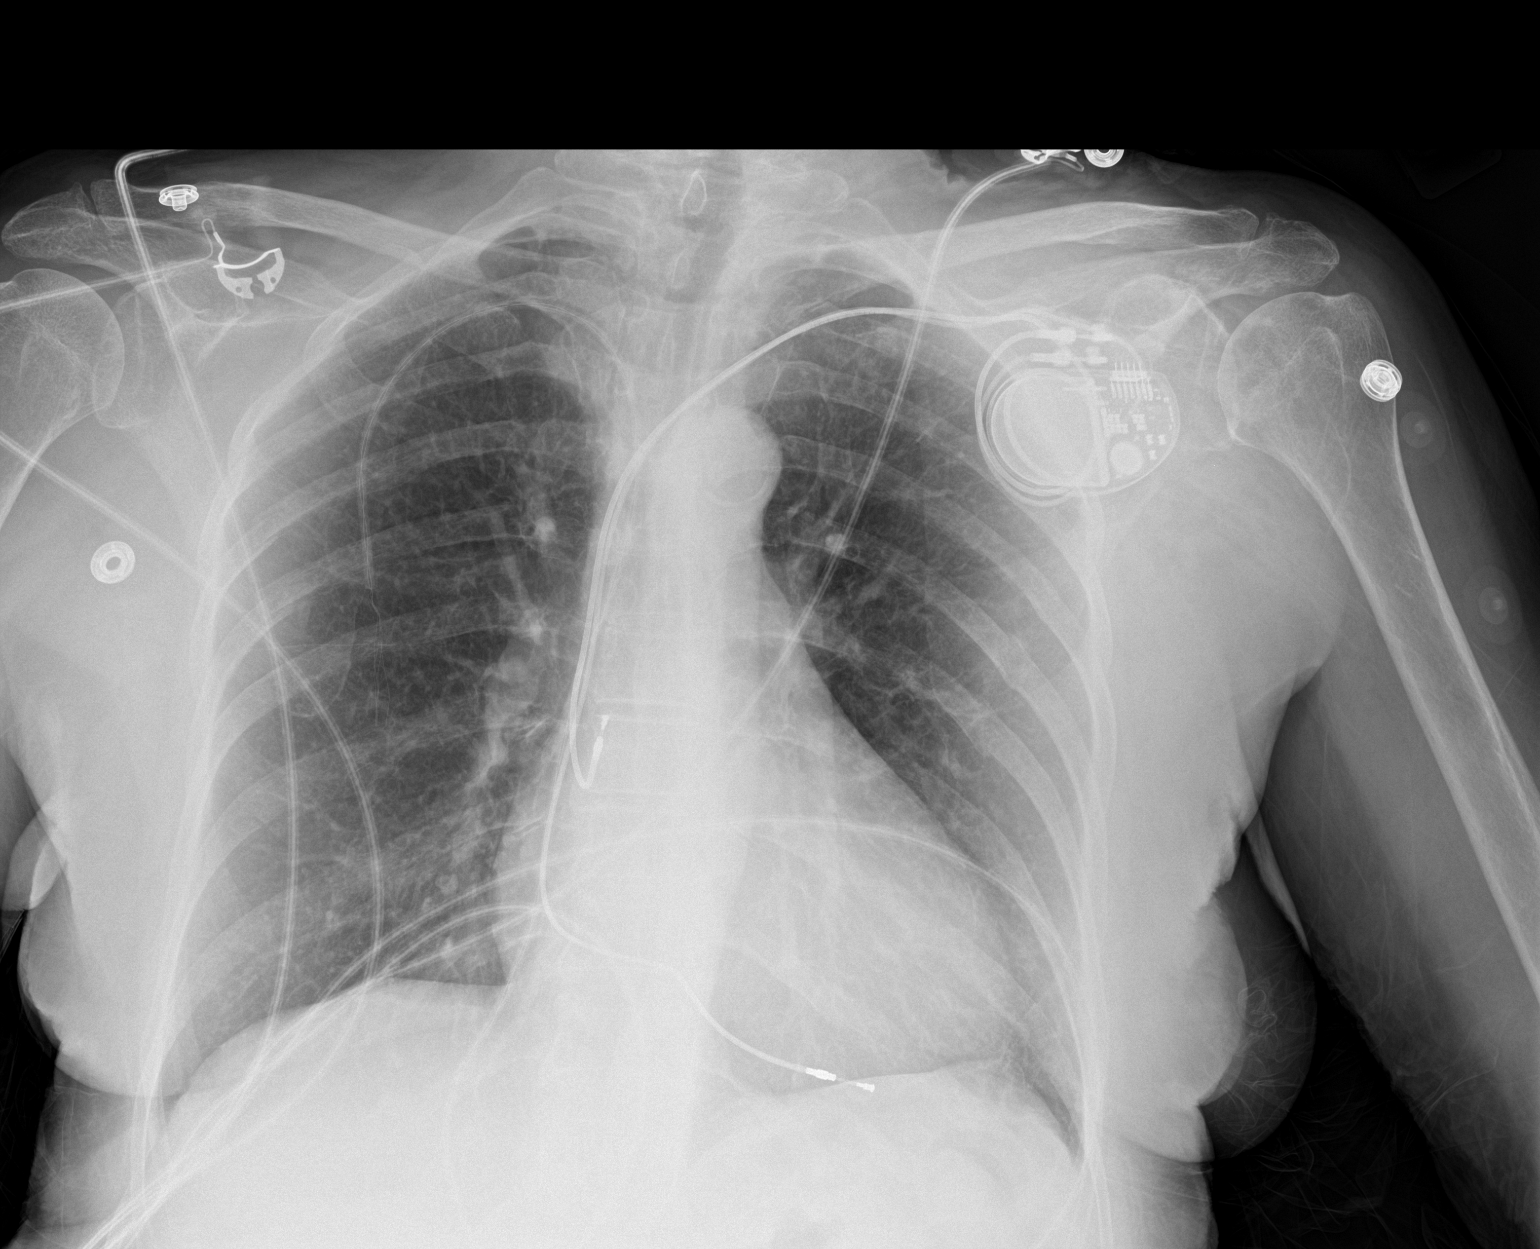

[2 of 2 positions shown; findings below may reference images not displayed]

FINDINGS: The lungs are well-expanded and clear. There is no pneumothorax or
pleural effusion. The cardiac silhouette is normal in size. The
pulmonary vascularity is not engorged. There has been interval
placement of a right subclavian venous catheter whose tip projects
over the midportion of the SVC. Fractures of the posterior lateral
aspects of the right fifth and sixth ribs are again demonstrated.
The permanent pacemaker is in reasonable position.
IMPRESSION: No active cardiopulmonary disease. The two right posterior lateral
rib fractures are unchanged. There is no pneumothorax.

## 2017-05-14 ENCOUNTER — Other Ambulatory Visit: Payer: Self-pay | Admitting: Cardiology

## 2017-05-14 DIAGNOSIS — I480 Paroxysmal atrial fibrillation: Secondary | ICD-10-CM

## 2017-05-31 ENCOUNTER — Ambulatory Visit (INDEPENDENT_AMBULATORY_CARE_PROVIDER_SITE_OTHER): Payer: Medicare HMO | Admitting: *Deleted

## 2017-05-31 DIAGNOSIS — G459 Transient cerebral ischemic attack, unspecified: Secondary | ICD-10-CM | POA: Diagnosis not present

## 2017-05-31 DIAGNOSIS — I480 Paroxysmal atrial fibrillation: Secondary | ICD-10-CM | POA: Diagnosis not present

## 2017-05-31 DIAGNOSIS — Z7901 Long term (current) use of anticoagulants: Secondary | ICD-10-CM

## 2017-05-31 DIAGNOSIS — Z79899 Other long term (current) drug therapy: Secondary | ICD-10-CM

## 2017-05-31 LAB — POCT INR: INR: 2.7

## 2017-07-06 ENCOUNTER — Ambulatory Visit (INDEPENDENT_AMBULATORY_CARE_PROVIDER_SITE_OTHER): Payer: Medicare HMO | Admitting: *Deleted

## 2017-07-06 DIAGNOSIS — I495 Sick sinus syndrome: Secondary | ICD-10-CM

## 2017-07-06 NOTE — Progress Notes (Signed)
Remote pacemaker transmission.   

## 2017-07-08 ENCOUNTER — Encounter: Payer: Self-pay | Admitting: Cardiology

## 2017-07-15 ENCOUNTER — Other Ambulatory Visit: Payer: Self-pay | Admitting: Cardiology

## 2017-07-15 DIAGNOSIS — I480 Paroxysmal atrial fibrillation: Secondary | ICD-10-CM

## 2017-07-21 ENCOUNTER — Ambulatory Visit (INDEPENDENT_AMBULATORY_CARE_PROVIDER_SITE_OTHER): Payer: Medicare HMO | Admitting: *Deleted

## 2017-07-21 DIAGNOSIS — I480 Paroxysmal atrial fibrillation: Secondary | ICD-10-CM

## 2017-07-21 DIAGNOSIS — Z7901 Long term (current) use of anticoagulants: Secondary | ICD-10-CM

## 2017-07-21 DIAGNOSIS — Z5181 Encounter for therapeutic drug level monitoring: Secondary | ICD-10-CM | POA: Diagnosis not present

## 2017-07-21 DIAGNOSIS — G459 Transient cerebral ischemic attack, unspecified: Secondary | ICD-10-CM | POA: Diagnosis not present

## 2017-07-21 DIAGNOSIS — Z79899 Other long term (current) drug therapy: Secondary | ICD-10-CM

## 2017-07-21 LAB — POCT INR: INR: 4.3

## 2017-07-22 ENCOUNTER — Encounter: Payer: Self-pay | Admitting: Cardiology

## 2017-07-26 LAB — CUP PACEART REMOTE DEVICE CHECK
Battery Remaining Percentage: 95.5 %
Brady Statistic AP VP Percent: 1 %
Brady Statistic AP VS Percent: 96 %
Brady Statistic AS VS Percent: 3.2 %
Brady Statistic RV Percent Paced: 1 %
Date Time Interrogation Session: 20181010061544
Implantable Lead Implant Date: 20160518
Implantable Lead Implant Date: 20160518
Implantable Lead Location: 753859
Implantable Lead Model: 1944
Lead Channel Impedance Value: 580 Ohm
Lead Channel Impedance Value: 690 Ohm
Lead Channel Pacing Threshold Amplitude: 0.625 V
Lead Channel Pacing Threshold Pulse Width: 0.4 ms
Lead Channel Sensing Intrinsic Amplitude: 0.4 mV
Lead Channel Sensing Intrinsic Amplitude: 9.3 mV
Lead Channel Setting Pacing Amplitude: 0.875
Lead Channel Setting Pacing Amplitude: 2 V
Lead Channel Setting Pacing Pulse Width: 0.4 ms
Lead Channel Setting Sensing Sensitivity: 2 mV
MDC IDC LEAD LOCATION: 753860
MDC IDC MSMT BATTERY REMAINING LONGEVITY: 131 mo
MDC IDC MSMT BATTERY VOLTAGE: 3.01 V
MDC IDC MSMT LEADCHNL RA PACING THRESHOLD AMPLITUDE: 0.5 V
MDC IDC MSMT LEADCHNL RA PACING THRESHOLD PULSEWIDTH: 0.4 ms
MDC IDC PG IMPLANT DT: 20160518
MDC IDC STAT BRADY AS VP PERCENT: 1 %
MDC IDC STAT BRADY RA PERCENT PACED: 95 %
Pulse Gen Model: 2240
Pulse Gen Serial Number: 7759914

## 2017-08-04 ENCOUNTER — Ambulatory Visit (INDEPENDENT_AMBULATORY_CARE_PROVIDER_SITE_OTHER): Payer: Medicare HMO | Admitting: Pharmacist

## 2017-08-04 DIAGNOSIS — I480 Paroxysmal atrial fibrillation: Secondary | ICD-10-CM | POA: Diagnosis not present

## 2017-08-04 DIAGNOSIS — Z7901 Long term (current) use of anticoagulants: Secondary | ICD-10-CM

## 2017-08-04 DIAGNOSIS — Z79899 Other long term (current) drug therapy: Secondary | ICD-10-CM

## 2017-08-04 DIAGNOSIS — G459 Transient cerebral ischemic attack, unspecified: Secondary | ICD-10-CM | POA: Diagnosis not present

## 2017-08-04 LAB — POCT INR: INR: 3.2

## 2017-08-08 ENCOUNTER — Other Ambulatory Visit: Payer: Self-pay | Admitting: Cardiology

## 2017-08-08 DIAGNOSIS — I480 Paroxysmal atrial fibrillation: Secondary | ICD-10-CM

## 2017-08-31 DIAGNOSIS — H6123 Impacted cerumen, bilateral: Secondary | ICD-10-CM | POA: Diagnosis not present

## 2017-08-31 DIAGNOSIS — Z974 Presence of external hearing-aid: Secondary | ICD-10-CM | POA: Diagnosis not present

## 2017-09-14 DIAGNOSIS — M069 Rheumatoid arthritis, unspecified: Secondary | ICD-10-CM | POA: Insufficient documentation

## 2017-09-14 DIAGNOSIS — Z9109 Other allergy status, other than to drugs and biological substances: Secondary | ICD-10-CM | POA: Insufficient documentation

## 2017-09-19 ENCOUNTER — Other Ambulatory Visit: Payer: Self-pay | Admitting: Internal Medicine

## 2017-09-19 MED ORDER — METOPROLOL TARTRATE 25 MG PO TABS: ORAL_TABLET | ORAL | 0 refills | Status: DC

## 2017-09-29 ENCOUNTER — Ambulatory Visit (INDEPENDENT_AMBULATORY_CARE_PROVIDER_SITE_OTHER): Payer: Medicare HMO | Admitting: Internal Medicine

## 2017-09-29 ENCOUNTER — Ambulatory Visit (INDEPENDENT_AMBULATORY_CARE_PROVIDER_SITE_OTHER): Payer: Medicare HMO

## 2017-09-29 ENCOUNTER — Encounter: Payer: Self-pay | Admitting: Internal Medicine

## 2017-09-29 VITALS — BP 126/62 | HR 60 | Ht 63.0 in | Wt 148.0 lb

## 2017-09-29 DIAGNOSIS — Z79899 Other long term (current) drug therapy: Secondary | ICD-10-CM

## 2017-09-29 DIAGNOSIS — I495 Sick sinus syndrome: Secondary | ICD-10-CM | POA: Diagnosis not present

## 2017-09-29 DIAGNOSIS — I480 Paroxysmal atrial fibrillation: Secondary | ICD-10-CM | POA: Diagnosis not present

## 2017-09-29 DIAGNOSIS — G459 Transient cerebral ischemic attack, unspecified: Secondary | ICD-10-CM

## 2017-09-29 DIAGNOSIS — Z950 Presence of cardiac pacemaker: Secondary | ICD-10-CM

## 2017-09-29 LAB — POCT INR: INR: 2.3

## 2017-09-29 NOTE — Progress Notes (Signed)
Patient Care Team: Unk Pinto, MD as PCP - General (Internal Medicine) Stanford Breed Denice Bors, MD as Consulting Physician (Cardiology) Clent Jacks, MD as Consulting Physician (Ophthalmology)   HPI  Erica Carroll is a 84 y.o. female Seen following a recent hospitalization for tachybradycardia syndrome. She underwent pacing. We initiated beta blockers to try to protect against tachyarrhythmia with limitations of up titration related to blood pressure.   Hx of atrial fibrillation with a rapid rate. was associated with chest pain and lightheadedness and stopped concurrent with by mouth Cardizem.     No symptomatic atrial fibrillation  No edema, chest pain   No obvious bleeding on coumadin; no falls     Date Cr K Hgb   6/18 1.3 4.3 12.3            Past Medical History:  Diagnosis Date  . A-fib ([place])    a. Dx 06/2013;  b. 06/2013 Echo:  EF 55-60%, no reg wma, mild AI, mod dil LA.  Marland Kitchen Allergy   . GERD (gastroesophageal reflux disease)   . Hyperlipidemia   . RA (rheumatoid arthritis) ([place])    " in my hands "  . Sinus bradycardia 07/11/2013  . TIA (transient ischemic attack)    a. with fall 05/2013 -> neg head CT and MRI/MRA;  b. Plavix started.  . Urinary incontinence   . Vitamin D deficiency     Past Surgical History:  Procedure Laterality Date  . APPENDECTOMY  1936  . EP IMPLANTABLE DEVICE N/A 02/12/2015   Procedure: Pacemaker Implant;  Surgeon: Deboraha Sprang, MD;  Location: [place] [facility];  Service: Cardiovascular;  Laterality: N/A;  . EYE SURGERY Right 1994   CE/IOL  . EYE SURGERY Left 1998   CE/IOL    Current Outpatient Medications  Medication Sig Dispense Refill  . acetaminophen (TYLENOL) 325 MG tablet Take 325 mg by mouth every 6 (six) hours as needed for mild pain.     Marland Kitchen b complex vitamins tablet Take 1 tablet by mouth daily.    . Cholecalciferol (VITAMIN D PO) Take 3,000 Units by mouth daily.     Marland Kitchen diltiazem (CARDIZEM) 30 MG tablet Take 1  tablet (30 mg total) by mouth daily as needed. For Afib episodes 30 tablet 10  . FLUZONE HIGH-DOSE 0.5 ML SUSY TO BE ADMINISTERED BY PHARMACIST FOR IMMUNIZATION  0  . GLUCOSAMINE-CHONDROITIN PO Take 1 tablet by mouth daily.    Marland Kitchen MAGNESIUM OXIDE, ANTACID, PO Take 1 tablet by mouth daily.    . metoprolol tartrate (LOPRESSOR) 25 MG tablet Take 3 tablets (75 mg) by mouth twice daily. Please keep upcoming appt for future refills. Thank you 180 tablet 0  . OVER THE COUNTER MEDICATION Take 1 tablet by mouth daily. Preserve vision vitamin    . polyethylene glycol (MIRALAX / GLYCOLAX) packet Take 17 g by mouth daily as needed (constipation).    . warfarin (COUMADIN) 5 MG tablet Take 1 by mouth daily or as directed by Coumadin Clinic 30 tablet 2   No current facility-administered medications for this visit.     Allergies  Allergen Reactions  . Other Other (See Comments)    News Print: (freshly printed newspapers) cause her eyes to water . Allergy type reaction. Medal: itching & rash    Review of Systems negative except from HPI and PMH  Physical Exam BP 126/62   Pulse 60   Ht 5\' 3"  (1.6 m)   Wt 148 lb (  67.1 kg)   BMI 26.22 kg/m  Well developed and nourished in no acute distress HENT normal Neck supple with JVP-flat Clear Regular rate and rhythm, no murmurs or gallops Abd-soft with active BS No Clubbing cyanosis edema Skin-warm and dry A & Oriented  Grossly normal sensory and motor function    ECG  A pacing@ 60 16/13/43 RBBB T wave inversions 2,3,f,V3-v6 Unchanged from 10/17 Personally reviewed    Assessment and  Plan  Paroxysmal atrial fibrillation with a very rapid rate  Junctional tach  Sinus bradycardia  Hypertension  Dizziness  Pacemaker-[employer]    BP well controlled  Recurrent atrial arrhythmias but without symptoms  On Anticoagulation;  No bleeding issues   No dizziness  HR excursion reasonable  Will check CBC and INR today

## 2017-09-29 NOTE — Patient Instructions (Signed)
Description   Continue on same dosage  5mg  (1 tablet) daily. Recheck INR 4 weeks.  Call if placed on any new medications #[phone]- [phone] Do good serving of greens today then keep intake of greens consistent

## 2017-09-29 NOTE — Patient Instructions (Addendum)
Medication Instructions: Your physician recommends that you continue on your current medications as directed. Please refer to the Current Medication list given to you today.  Labwork: Your physician has recommended that you have lab work today: CBC and PT/INR   Procedures/Testing: None Ordered  Follow-Up: Your physician wants you to follow-up in: 1 YEAR with Tommye Standard, PA-C . You will receive a reminder letter in the mail two months in advance. If you don't receive a letter, please call our office to schedule the follow-up appointment.  Remote monitoring is used to monitor your Pacemaker from home. This monitoring reduces the number of office visits required to check your device to one time per year. It allows [place] to keep an eye on the functioning of your device to ensure it is working properly. You are scheduled for a device check from home on 10/05/17. You may send your transmission at any time that day. If you have a wireless device, the transmission will be sent automatically. After your physician reviews your transmission, you will receive a postcard with your next transmission date.   If you need a refill on your cardiac medications before your next appointment, please call your pharmacy.

## 2017-09-30 LAB — CBC WITH DIFFERENTIAL/PLATELET
BASOS ABS: 0 10*3/uL (ref 0.0–0.2)
BASOS: 0 %
EOS (ABSOLUTE): 0.4 10*3/uL (ref 0.0–0.4)
Eos: 5 %
Hematocrit: 35.4 % (ref 34.0–46.6)
Hemoglobin: 11.6 g/dL (ref 11.1–15.9)
IMMATURE GRANS (ABS): 0 10*3/uL (ref 0.0–0.1)
Immature Granulocytes: 0 %
LYMPHS: 31 %
Lymphocytes Absolute: 2.3 10*3/uL (ref 0.7–3.1)
MCH: 30.2 pg (ref 26.6–33.0)
MCHC: 32.8 g/dL (ref 31.5–35.7)
MCV: 92 fL (ref 79–97)
MONOS ABS: 0.8 10*3/uL (ref 0.1–0.9)
Monocytes: 10 %
Neutrophils Absolute: 4 10*3/uL (ref 1.4–7.0)
Neutrophils: 54 %
PLATELETS: 199 10*3/uL (ref 150–379)
RBC: 3.84 x10E6/uL (ref 3.77–5.28)
RDW: 13.3 % (ref 12.3–15.4)
WBC: 7.4 10*3/uL (ref 3.4–10.8)

## 2017-10-05 ENCOUNTER — Ambulatory Visit (INDEPENDENT_AMBULATORY_CARE_PROVIDER_SITE_OTHER): Payer: Medicare HMO | Admitting: *Deleted

## 2017-10-05 DIAGNOSIS — I495 Sick sinus syndrome: Secondary | ICD-10-CM

## 2017-10-05 NOTE — Progress Notes (Signed)
Remote pacemaker transmission.   

## 2017-10-06 ENCOUNTER — Encounter: Payer: Self-pay | Admitting: Cardiology

## 2017-10-10 LAB — CUP PACEART REMOTE DEVICE CHECK
Battery Voltage: 3.01 V
Brady Statistic AP VP Percent: 2.4 %
Brady Statistic AP VS Percent: 95 %
Brady Statistic AS VP Percent: 1 %
Brady Statistic AS VS Percent: 2.1 %
Brady Statistic RV Percent Paced: 2.4 %
Date Time Interrogation Session: 20190109085906
Implantable Lead Implant Date: 20160518
Implantable Lead Implant Date: 20160518
Implantable Lead Location: 753859
Implantable Lead Model: 1948
Implantable Pulse Generator Implant Date: 20160518
Lead Channel Impedance Value: 660 Ohm
Lead Channel Pacing Threshold Amplitude: 0.5 V
Lead Channel Pacing Threshold Amplitude: 0.625 V
Lead Channel Pacing Threshold Pulse Width: 0.4 ms
Lead Channel Setting Pacing Amplitude: 0.875
Lead Channel Setting Pacing Pulse Width: 0.4 ms
MDC IDC LEAD LOCATION: 753860
MDC IDC MSMT BATTERY REMAINING LONGEVITY: 127 mo
MDC IDC MSMT BATTERY REMAINING PERCENTAGE: 95.5 %
MDC IDC MSMT LEADCHNL RA IMPEDANCE VALUE: 510 Ohm
MDC IDC MSMT LEADCHNL RA PACING THRESHOLD PULSEWIDTH: 0.4 ms
MDC IDC MSMT LEADCHNL RA SENSING INTR AMPL: 0.6 mV
MDC IDC MSMT LEADCHNL RV SENSING INTR AMPL: 9.1 mV
MDC IDC PG SERIAL: 7759914
MDC IDC SET LEADCHNL RA PACING AMPLITUDE: 2 V
MDC IDC SET LEADCHNL RV SENSING SENSITIVITY: 2 mV
MDC IDC STAT BRADY RA PERCENT PACED: 96 %

## 2017-11-02 LAB — CUP PACEART INCLINIC DEVICE CHECK
Battery Remaining Longevity: 127 mo
Date Time Interrogation Session: 20190103202209
Implantable Lead Implant Date: 20160518
Implantable Lead Location: 753859
Implantable Lead Location: 753860
Implantable Lead Model: 1944
Implantable Pulse Generator Implant Date: 20160518
Lead Channel Pacing Threshold Amplitude: 0.5 V
Lead Channel Pacing Threshold Amplitude: 0.625 V
Lead Channel Pacing Threshold Pulse Width: 0.4 ms
Lead Channel Sensing Intrinsic Amplitude: 9 mV
Lead Channel Setting Pacing Amplitude: 0.875
Lead Channel Setting Pacing Amplitude: 2 V
Lead Channel Setting Pacing Pulse Width: 0.4 ms
Lead Channel Setting Sensing Sensitivity: 2 mV
MDC IDC LEAD IMPLANT DT: 20160518
MDC IDC MSMT BATTERY VOLTAGE: 3.01 V
MDC IDC MSMT LEADCHNL RA IMPEDANCE VALUE: 512.5 Ohm
MDC IDC MSMT LEADCHNL RA PACING THRESHOLD PULSEWIDTH: 0.4 ms
MDC IDC MSMT LEADCHNL RA SENSING INTR AMPL: 0.5 mV
MDC IDC MSMT LEADCHNL RV IMPEDANCE VALUE: 650 Ohm
MDC IDC PG SERIAL: 7759914
MDC IDC STAT BRADY RA PERCENT PACED: 95 %
MDC IDC STAT BRADY RV PERCENT PACED: 1.2 %
Pulse Gen Model: 2240

## 2017-11-16 ENCOUNTER — Other Ambulatory Visit: Payer: Self-pay | Admitting: Internal Medicine

## 2017-11-16 ENCOUNTER — Ambulatory Visit (INDEPENDENT_AMBULATORY_CARE_PROVIDER_SITE_OTHER): Payer: Medicare HMO | Admitting: *Deleted

## 2017-11-16 DIAGNOSIS — I480 Paroxysmal atrial fibrillation: Secondary | ICD-10-CM

## 2017-11-16 DIAGNOSIS — Z5181 Encounter for therapeutic drug level monitoring: Secondary | ICD-10-CM

## 2017-11-16 DIAGNOSIS — Z79899 Other long term (current) drug therapy: Secondary | ICD-10-CM

## 2017-11-16 DIAGNOSIS — G459 Transient cerebral ischemic attack, unspecified: Secondary | ICD-10-CM | POA: Diagnosis not present

## 2017-11-16 LAB — POCT INR: INR: 3.1

## 2017-11-16 MED ORDER — METOPROLOL TARTRATE 25 MG PO TABS: ORAL_TABLET | ORAL | 3 refills | Status: DC

## 2017-11-16 NOTE — Telephone Encounter (Signed)
New message     *STAT* If patient is at the pharmacy, call can be transferred to refill team.   1. Which medications need to be refilled? (please list name of each medication and dose if known) metoprolol tartrate (LOPRESSOR) 25 MG tablet  2. Which pharmacy/location (including street and city if local pharmacy) is medication to be sent to?[place], [place] - [address]  3. Do they need a 30 day or 90 day supply? [facility]

## 2017-11-16 NOTE — Patient Instructions (Signed)
Description   Today only take 2.5mg  (1/2 tablet) then Continue on same dosage  5mg  (1 tablet) daily. Recheck INR 4 weeks.  Call if placed on any new medications #[phone]- [phone]. keep intake of greens consistent

## 2017-11-16 NOTE — Telephone Encounter (Signed)
Pt's medication was sent to pt's pharmacy as requested. Confirmation received.  °

## 2017-12-13 ENCOUNTER — Other Ambulatory Visit: Payer: Self-pay | Admitting: Internal Medicine

## 2017-12-13 DIAGNOSIS — I480 Paroxysmal atrial fibrillation: Secondary | ICD-10-CM

## 2018-01-04 ENCOUNTER — Ambulatory Visit (INDEPENDENT_AMBULATORY_CARE_PROVIDER_SITE_OTHER): Payer: Medicare HMO | Admitting: *Deleted

## 2018-01-04 DIAGNOSIS — I495 Sick sinus syndrome: Secondary | ICD-10-CM | POA: Diagnosis not present

## 2018-01-04 NOTE — Progress Notes (Signed)
Remote pacemaker transmission.   

## 2018-01-05 ENCOUNTER — Ambulatory Visit (INDEPENDENT_AMBULATORY_CARE_PROVIDER_SITE_OTHER): Payer: Medicare HMO | Admitting: *Deleted

## 2018-01-05 ENCOUNTER — Encounter: Payer: Self-pay | Admitting: Cardiology

## 2018-01-05 DIAGNOSIS — I480 Paroxysmal atrial fibrillation: Secondary | ICD-10-CM

## 2018-01-05 DIAGNOSIS — Z79899 Other long term (current) drug therapy: Secondary | ICD-10-CM

## 2018-01-05 DIAGNOSIS — Z5181 Encounter for therapeutic drug level monitoring: Secondary | ICD-10-CM

## 2018-01-05 DIAGNOSIS — G459 Transient cerebral ischemic attack, unspecified: Secondary | ICD-10-CM | POA: Diagnosis not present

## 2018-01-05 LAB — POCT INR: INR: 2.8

## 2018-01-05 NOTE — Patient Instructions (Signed)
Description   Continue on same dosage 5mg  (1 tablet) daily.  Recheck INR 4 weeks.  Call if placed on any new medications #[phone]- [phone]. keep intake of greens consistent

## 2018-02-03 ENCOUNTER — Ambulatory Visit (INDEPENDENT_AMBULATORY_CARE_PROVIDER_SITE_OTHER): Payer: Medicare HMO

## 2018-02-03 DIAGNOSIS — Z79899 Other long term (current) drug therapy: Secondary | ICD-10-CM | POA: Diagnosis not present

## 2018-02-03 DIAGNOSIS — G459 Transient cerebral ischemic attack, unspecified: Secondary | ICD-10-CM | POA: Diagnosis not present

## 2018-02-03 DIAGNOSIS — I480 Paroxysmal atrial fibrillation: Secondary | ICD-10-CM

## 2018-02-03 LAB — CUP PACEART REMOTE DEVICE CHECK
Battery Remaining Percentage: 95.5 %
Brady Statistic AP VP Percent: 1.3 %
Brady Statistic AP VS Percent: 95 %
Brady Statistic AS VP Percent: 1 %
Brady Statistic AS VS Percent: 3.3 %
Brady Statistic RV Percent Paced: 1.3 %
Implantable Lead Implant Date: 20160518
Implantable Lead Location: 753859
Implantable Lead Location: 753860
Implantable Lead Model: 1944
Lead Channel Impedance Value: 650 Ohm
Lead Channel Pacing Threshold Amplitude: 0.625 V
Lead Channel Pacing Threshold Pulse Width: 0.4 ms
Lead Channel Sensing Intrinsic Amplitude: 0.4 mV
Lead Channel Sensing Intrinsic Amplitude: 8.7 mV
Lead Channel Setting Pacing Amplitude: 0.875
Lead Channel Setting Pacing Amplitude: 2 V
Lead Channel Setting Pacing Pulse Width: 0.4 ms
Lead Channel Setting Sensing Sensitivity: 2 mV
MDC IDC LEAD IMPLANT DT: 20160518
MDC IDC MSMT BATTERY REMAINING LONGEVITY: 127 mo
MDC IDC MSMT BATTERY VOLTAGE: 3.01 V
MDC IDC MSMT LEADCHNL RA IMPEDANCE VALUE: 450 Ohm
MDC IDC MSMT LEADCHNL RA PACING THRESHOLD AMPLITUDE: 0.5 V
MDC IDC MSMT LEADCHNL RA PACING THRESHOLD PULSEWIDTH: 0.4 ms
MDC IDC PG IMPLANT DT: 20160518
MDC IDC SESS DTM: 20190410074214
MDC IDC STAT BRADY RA PERCENT PACED: 94 %
Pulse Gen Serial Number: 7759914

## 2018-02-03 LAB — POCT INR: INR: 4

## 2018-02-03 NOTE — Patient Instructions (Signed)
Description   Skip today's dosage of Coumadin, then resume same dosage 5mg  (1 tablet) daily.  Recheck INR 2 weeks.  Call if placed on any new medications #[phone]- [phone]. keep intake of greens consistent

## 2018-02-16 ENCOUNTER — Ambulatory Visit: Payer: Medicare HMO

## 2018-02-16 DIAGNOSIS — G459 Transient cerebral ischemic attack, unspecified: Secondary | ICD-10-CM | POA: Diagnosis not present

## 2018-02-16 DIAGNOSIS — Z79899 Other long term (current) drug therapy: Secondary | ICD-10-CM

## 2018-02-16 DIAGNOSIS — I480 Paroxysmal atrial fibrillation: Secondary | ICD-10-CM

## 2018-02-16 LAB — POCT INR: INR: 1.6 — AB (ref 2.0–3.0)

## 2018-02-16 NOTE — Patient Instructions (Signed)
Description   Take 1.5 tablets today, then resume same dosage 5mg  (1 tablet) daily.  Recheck INR 2 weeks.  Call if placed on any new medications #[phone]. keep intake of greens consistent

## 2018-03-24 ENCOUNTER — Ambulatory Visit: Payer: Medicare HMO | Admitting: *Deleted

## 2018-03-24 DIAGNOSIS — I480 Paroxysmal atrial fibrillation: Secondary | ICD-10-CM

## 2018-03-24 DIAGNOSIS — Z5181 Encounter for therapeutic drug level monitoring: Secondary | ICD-10-CM

## 2018-03-24 DIAGNOSIS — G459 Transient cerebral ischemic attack, unspecified: Secondary | ICD-10-CM | POA: Diagnosis not present

## 2018-03-24 DIAGNOSIS — Z79899 Other long term (current) drug therapy: Secondary | ICD-10-CM

## 2018-03-24 LAB — POCT INR: INR: 3.6 — AB (ref 2.0–3.0)

## 2018-03-24 NOTE — Patient Instructions (Signed)
Description   Hold today's dose then resume same dosage 5mg  (1 tablet) daily.  Recheck INR 2 weeks.  Call if placed on any new medications #[phone]- [phone]. keep intake of greens consistent

## 2018-03-28 ENCOUNTER — Encounter: Payer: Self-pay | Admitting: Internal Medicine

## 2018-03-28 ENCOUNTER — Ambulatory Visit: Payer: Medicare HMO | Admitting: Internal Medicine

## 2018-03-28 VITALS — BP 118/62 | HR 68 | Temp 97.0°F | Resp 16 | Ht 63.25 in | Wt 148.6 lb

## 2018-03-28 DIAGNOSIS — E782 Mixed hyperlipidemia: Secondary | ICD-10-CM

## 2018-03-28 DIAGNOSIS — Z1212 Encounter for screening for malignant neoplasm of rectum: Secondary | ICD-10-CM

## 2018-03-28 DIAGNOSIS — Z0000 Encounter for general adult medical examination without abnormal findings: Secondary | ICD-10-CM | POA: Diagnosis not present

## 2018-03-28 DIAGNOSIS — R7309 Other abnormal glucose: Secondary | ICD-10-CM

## 2018-03-28 DIAGNOSIS — R7303 Prediabetes: Secondary | ICD-10-CM

## 2018-03-28 DIAGNOSIS — Z1211 Encounter for screening for malignant neoplasm of colon: Secondary | ICD-10-CM

## 2018-03-28 DIAGNOSIS — I2510 Atherosclerotic heart disease of native coronary artery without angina pectoris: Secondary | ICD-10-CM

## 2018-03-28 DIAGNOSIS — Z136 Encounter for screening for cardiovascular disorders: Secondary | ICD-10-CM | POA: Diagnosis not present

## 2018-03-28 DIAGNOSIS — I480 Paroxysmal atrial fibrillation: Secondary | ICD-10-CM

## 2018-03-28 DIAGNOSIS — I10 Essential (primary) hypertension: Secondary | ICD-10-CM | POA: Diagnosis not present

## 2018-03-28 DIAGNOSIS — E559 Vitamin D deficiency, unspecified: Secondary | ICD-10-CM | POA: Diagnosis not present

## 2018-03-28 DIAGNOSIS — Z79899 Other long term (current) drug therapy: Secondary | ICD-10-CM

## 2018-03-28 DIAGNOSIS — Z0001 Encounter for general adult medical examination with abnormal findings: Secondary | ICD-10-CM

## 2018-03-28 DIAGNOSIS — Z950 Presence of cardiac pacemaker: Secondary | ICD-10-CM

## 2018-03-28 NOTE — Progress Notes (Signed)
[facility] Unk Pinto, M.D.     Uvaldo Bristle. Silverio Lay, P.A.-C Liane Comber, [place], [place] [address] Telephone [phone] Telefax [phone] Annual Screening/Preventative Visit & Comprehensive Evaluation &  Examination     This very nice 84 y.o. WWF presents for a Screening /Preventative Visit & comprehensive evaluation and management of multiple medical co-morbidities.  Patient has been followed for HTN, ASHD, HLD, Prediabetes  and Vitamin D Deficiency.     Labile  HTN predates since the 1960's monitored expectantly, but treatment only started in Sept 2014 when she presented with rapid Afib. She had a TIA at that time & was started on Plavix and after a 2sd TIA was switched to Coumadin. In 2016, she had a [employer] Jude PPM implanted by Dr [name] for SSS w/ Tachy/Brady Syn.  Patient's BP has been controlled at home and patient denies any cardiac symptoms as chest pain, palpitations, shortness of breath, dizziness or ankle swelling. Today's BP is at goal - 118/62.      Patient's hyperlipidemia is controlled with diet and medications. Patient denies myalgias or other medication SE's. Last lipids were  Lab Results  Component Value Date   CHOL 146 03/02/2017   HDL 39 (L) 03/02/2017   LDLCALC 49 03/02/2017   TRIG 290 (H) 03/02/2017   CHOLHDL 3.7 03/02/2017      Patient has prediabetes (A1c 5.5%/2012 & 2014)  and patient denies reactive hypoglycemic symptoms, visual blurring, diabetic polys, or paresthesias. Last A1c was at goal: Lab Results  Component Value Date   HGBA1C 5.3 03/02/2017      Finally, patient has history of Vitamin D Deficiency ("18"/2010) and last Vitamin D was sl low (goal 70-100): Lab Results  Component Value Date   VD25OH 50 03/02/2017   Current Outpatient Medications on File Prior to Visit  Medication Sig  . acetaminophen (TYLENOL) 325 MG tablet Take 325 mg by mouth  every 6 (six) hours as needed for mild pain.   Marland Kitchen b complex vitamins tablet Take 1 tablet by mouth daily.  . Cholecalciferol (VITAMIN D PO) Take 3,000 Units by mouth daily.   Marland Kitchen diltiazem (CARDIZEM) 30 MG tablet Take 1 tablet (30 mg total) by mouth daily as needed. For Afib episodes  . FLUZONE HIGH-DOSE 0.5 ML SUSY TO BE ADMINISTERED BY PHARMACIST FOR IMMUNIZATION  . GLUCOSAMINE-CHONDROITIN PO Take 1 tablet by mouth daily.  Marland Kitchen MAGNESIUM OXIDE, ANTACID, PO Take 1 tablet by mouth daily.  . metoprolol tartrate (LOPRESSOR) 25 MG tablet Take 3 tablets (75 mg) by mouth twice daily.  Marland Kitchen OVER THE COUNTER MEDICATION Take 1 tablet by mouth daily. Preserve vision vitamin  . polyethylene glycol (MIRALAX / GLYCOLAX) packet Take 17 g by mouth daily as needed (constipation).  . warfarin (COUMADIN) 5 MG tablet Take 1 by mouth daily or as directed by Coumadin Clinic   No current facility-administered medications on file prior to visit.    Allergies  Allergen Reactions  . Other Other (See Comments)    News Print: (freshly printed newspapers) cause her eyes to water . Allergy type reaction. Medal: itching & rash   Past Medical History:  Diagnosis Date  . A-fib ([place])    a. Dx 06/2013;  b. 06/2013 Echo:  EF 55-60%, no reg wma, mild AI, mod dil LA.  Marland Kitchen Allergy   . GERD (gastroesophageal reflux disease)   . Hyperlipidemia   . RA (rheumatoid arthritis) ([place])    "  in my hands "  . Sinus bradycardia 07/11/2013  . TIA (transient ischemic attack)    a. with fall 05/2013 -> neg head CT and MRI/MRA;  b. Plavix started.  . Urinary incontinence   . Vitamin D deficiency    Health Maintenance  Topic Date Due  . DEXA SCAN  08/02/1989  . INFLUENZA VACCINE  04/27/2018  . TETANUS/TDAP  12/12/2025  . PNA vac Low Risk Adult  Completed   Immunization History  Administered Date(s) Administered  . Influenza, High Dose Seasonal PF 07/18/2014  . Influenza,inj,Quad PF,6+ Mos 07/26/2013, 07/07/2016  . Influenza-Unspecified  05/28/2012  . Pneumococcal Conjugate-13 03/02/2017  . Pneumococcal-Unspecified 11/25/2008  . Td 09/13/2006  . Tdap 12/13/2015   Last Colon - never - always has declined  Last MGM - 04/25/2017 & daughter to schedule.   Past Surgical History:  Procedure Laterality Date  . APPENDECTOMY  1936  . EP IMPLANTABLE DEVICE N/A 02/12/2015   Procedure: Pacemaker Implant;  Surgeon: Deboraha Sprang, MD;  Location: [facility];  Service: Cardiovascular;  Laterality: N/A;  . EYE SURGERY Right 1994   CE/IOL  . EYE SURGERY Left 1998   CE/IOL   Family History  Problem Relation Age of Onset  . Heart disease Mother   . Heart disease Sister   . Heart disease Brother   . Heart disease Brother   . CVA Sister   . Thyroid disease Sister    Social History   Tobacco Use  . Smoking status: Never Smoker  . Smokeless tobacco: Never Used  Substance Use Topics  . Alcohol use: Yes    Alcohol/week: 0.0 oz    Comment: ocassional-rarely  . Drug use: No    ROS Constitutional: Denies fever, chills, weight loss/gain, headaches, insomnia,  night sweats, and change in appetite. Does c/o fatigue. Eyes: Denies redness, blurred vision, diplopia, discharge, itchy, watery eyes.  ENT: Denies discharge, congestion, post nasal drip, epistaxis, sore throat, earache, hearing loss, dental pain, Tinnitus, Vertigo, Sinus pain, snoring.  Cardio: Denies chest pain, palpitations, irregular heartbeat, syncope, dyspnea, diaphoresis, orthopnea, PND, claudication, edema Respiratory: denies cough, dyspnea, DOE, pleurisy, hoarseness, laryngitis, wheezing.  Gastrointestinal: Denies dysphagia, heartburn, reflux, water brash, pain, cramps, nausea, vomiting, bloating, diarrhea, constipation, hematemesis, melena, hematochezia, jaundice, hemorrhoids Genitourinary: Denies dysuria, frequency, urgency, nocturia, hesitancy, discharge, hematuria, flank pain Breast: Breast lumps, nipple discharge, bleeding.  Musculoskeletal: Denies  arthralgia, myalgia, stiffness, Jt. Swelling, pain, limp, and strain/sprain. Denies falls. Skin: Denies puritis, rash, hives, warts, acne, eczema, changing in skin lesion Neuro: No weakness, tremor, incoordination, spasms, paresthesia, pain Psychiatric: Denies confusion, memory loss, sensory loss. Denies Depression. Endocrine: Denies change in weight, skin, hair change, nocturia, and paresthesia, diabetic polys, visual blurring, hyper / hypo glycemic episodes.  Heme/Lymph: No excessive bleeding, bruising, enlarged lymph nodes.  Physical Exam  BP 118/62   Pulse 68   Temp (!) 97 F (36.1 C)   Resp 16   Ht 5' 3.25" (1.607 m)   Wt 148 lb 9.6 oz (67.4 kg)   BMI 26.12 kg/m   General Appearance: Well nourished, well groomed and in no apparent distress.  Eyes: PERRLA, EOMs, conjunctiva no swelling or erythema, normal fundi and vessels. Sinuses: No frontal/maxillary tenderness ENT/Mouth: EACs patent / TMs  nl. Nares clear without erythema, swelling, mucoid exudates. Oral hygiene is good. No erythema, swelling, or exudate. Tongue normal, non-obstructing. Tonsils not swollen or erythematous. Hearing normal.  Neck: Supple, thyroid not palpable. No bruits, nodes or JVD. Respiratory: Respiratory effort  normal.  BS equal and clear bilateral without rales, rhonci, wheezing or stridor. Cardio: Heart sounds are normal with regular rate and rhythm and no murmurs, rubs or gallops. Peripheral pulses are normal and equal bilaterally without edema. No aortic or femoral bruits. Chest: symmetric with normal excursions and percussion. Breasts: Symmetric, without lumps, nipple discharge, retractions, or fibrocystic changes.  Abdomen: Flat, soft with bowel sounds active. Nontender, no guarding, rebound, hernias, masses, or organomegaly.  Lymphatics: Non tender without lymphadenopathy.  Musculoskeletal: Full ROM all peripheral extremities, joint stability, 5/5 strength, and normal gait. Skin: Warm and dry without  rashes, lesions, cyanosis, clubbing or  ecchymosis.  Neuro: Cranial nerves intact, reflexes equal bilaterally. Normal muscle tone, no cerebellar symptoms. Sensation intact.  Pysch: Alert and oriented X 3, normal affect, Insight and Judgment appropriate.   Assessment and Plan  1. Annual Preventative Screening Examination  2. Essential hypertension  - EKG 12-Lead - Urinalysis, Routine w reflex microscopic - Microalbumin / creatinine urine ratio - CBC with Differential/Platelet - COMPLETE METABOLIC PANEL WITH GFR - Magnesium - TSH  3. Hyperlipidemia, mixed  - EKG 12-Lead - Lipid panel - TSH  4. Abnormal glucose  - EKG 12-Lead - Hemoglobin A1c - Insulin, random  5. Vitamin D deficiency  - VITAMIN D 25 Hydroxyl 6. Prediabetes  - EKG 12-Lead - Hemoglobin A1c - Insulin, random  7. ASHD (arteriosclerotic heart disease)  - EKG 12-Lead - Lipid panel  8. Cardiac pacemaker  - EKG 12-Lead  9. Paroxysmal atrial fibrillation (HCC)  - EKG 12-Lead - TSH  10. Screening for ischemic heart disease  - EKG 12-Lead  11. Encounter for colorectal cancer screening  - POC Hemoccult Bld/Stl  12. Medication management  - Urinalysis, Routine w reflex microscopic - Microalbumin / creatinine urine ratio - CBC with Differential/Platelet - COMPLETE METABOLIC PANEL WITH GFR - Magnesium - Lipid panel - TSH - Hemoglobin A1c - Insulin, random - VITAMIN D 25 Hydroxyl       Patient was counseled in prudent diet to achieve/maintain BMI less than 25 for weight control, BP monitoring, regular exercise and medications. Discussed med's effects and SE's. Screening labs and tests as requested with regular follow-up as recommended. Over 40 minutes of exam, counseling, chart review and high complex critical decision making was performed.

## 2018-03-28 NOTE — Patient Instructions (Signed)

## 2018-03-29 ENCOUNTER — Other Ambulatory Visit: Payer: Self-pay | Admitting: Internal Medicine

## 2018-03-29 LAB — COMPLETE METABOLIC PANEL WITH GFR
AG RATIO: 1.6 (calc) (ref 1.0–2.5)
ALT: 9 U/L (ref 6–29)
AST: 18 U/L (ref 10–35)
Albumin: 4.3 g/dL (ref 3.6–5.1)
Alkaline phosphatase (APISO): 48 U/L (ref 33–130)
BUN/Creatinine Ratio: 10 (calc) (ref 6–22)
BUN: 13 mg/dL (ref 7–25)
CALCIUM: 9.2 mg/dL (ref 8.6–10.4)
CO2: 25 mmol/L (ref 20–32)
CREATININE: 1.24 mg/dL — AB (ref 0.60–0.88)
Chloride: 100 mmol/L (ref 98–110)
GFR, Est African American: 43 mL/min/{1.73_m2} — ABNORMAL LOW (ref >=60)
GFR, Est Non African American: 37 mL/min/{1.73_m2} — ABNORMAL LOW (ref >=60)
Globulin: 2.7 g/dL (calc) (ref 1.9–3.7)
Glucose, Bld: 94 mg/dL (ref 65–99)
Potassium: 4 mmol/L (ref 3.5–5.3)
Sodium: 135 mmol/L (ref 135–146)
TOTAL PROTEIN: 7 g/dL (ref 6.1–8.1)
Total Bilirubin: 0.6 mg/dL (ref 0.2–1.2)

## 2018-03-29 LAB — HEMOGLOBIN A1C
Hgb A1c MFr Bld: 5.4 % of total Hgb (ref <5.7)
MEAN PLASMA GLUCOSE: 108 (calc)
eAG (mmol/L): 6 (calc)

## 2018-03-29 LAB — LIPID PANEL
CHOLESTEROL: 145 mg/dL (ref <200)
HDL: 47 mg/dL — AB (ref >50)
LDL CHOLESTEROL (CALC): 77 mg/dL
Non-HDL Cholesterol (Calc): 98 mg/dL (calc) (ref <130)
TRIGLYCERIDES: 124 mg/dL (ref <150)
Total CHOL/HDL Ratio: 3.1 (calc) (ref <5.0)

## 2018-03-29 LAB — CBC WITH DIFFERENTIAL/PLATELET
Basophils Absolute: 30 cells/uL (ref 0–200)
Basophils Relative: 0.5 %
EOS ABS: 342 {cells}/uL (ref 15–500)
Eosinophils Relative: 5.7 %
HEMATOCRIT: 34.9 % — AB (ref 35.0–45.0)
Hemoglobin: 11.8 g/dL (ref 11.7–15.5)
LYMPHS ABS: 1788 {cells}/uL (ref 850–3900)
MCH: 30.4 pg (ref 27.0–33.0)
MCHC: 33.8 g/dL (ref 32.0–36.0)
MCV: 89.9 fL (ref 80.0–100.0)
MPV: 10 fL (ref 7.5–12.5)
Monocytes Relative: 10.2 %
Neutro Abs: 3228 cells/uL (ref 1500–7800)
Neutrophils Relative %: 53.8 %
PLATELETS: 181 10*3/uL (ref 140–400)
RBC: 3.88 10*6/uL (ref 3.80–5.10)
RDW: 12.5 % (ref 11.0–15.0)
Total Lymphocyte: 29.8 %
WBC: 6 10*3/uL (ref 3.8–10.8)
WBCMIX: 612 {cells}/uL (ref 200–950)

## 2018-03-29 LAB — URINALYSIS, ROUTINE W REFLEX MICROSCOPIC
Bilirubin Urine: NEGATIVE
GLUCOSE, UA: NEGATIVE
HGB URINE DIPSTICK: NEGATIVE
Hyaline Cast: NONE SEEN /LPF
Ketones, ur: NEGATIVE
NITRITE: NEGATIVE
PH: 7.5 (ref 5.0–8.0)
Protein, ur: NEGATIVE
Specific Gravity, Urine: 1.011 (ref 1.001–1.03)
Squamous Epithelial / LPF: NONE SEEN /HPF (ref <=5)

## 2018-03-29 LAB — MAGNESIUM: Magnesium: 2.1 mg/dL (ref 1.5–2.5)

## 2018-03-29 LAB — MICROALBUMIN / CREATININE URINE RATIO
Creatinine, Urine: 57 mg/dL (ref 20–275)
MICROALB/CREAT RATIO: 11 ug/mg{creat} (ref <30)
Microalb, Ur: 0.6 mg/dL

## 2018-03-29 LAB — INSULIN, RANDOM: Insulin: 7.1 u[IU]/mL (ref 2.0–19.6)

## 2018-03-29 LAB — VITAMIN D 25 HYDROXY (VIT D DEFICIENCY, FRACTURES): VIT D 25 HYDROXY: 54 ng/mL (ref 30–100)

## 2018-03-29 LAB — TSH: TSH: 0.9 mIU/L (ref 0.40–4.50)

## 2018-03-29 MED ORDER — CIPROFLOXACIN HCL 250 MG PO TABS: ORAL_TABLET | ORAL | 0 refills | Status: DC

## 2018-04-11 ENCOUNTER — Ambulatory Visit (INDEPENDENT_AMBULATORY_CARE_PROVIDER_SITE_OTHER): Payer: Medicare HMO | Admitting: *Deleted

## 2018-04-11 DIAGNOSIS — I495 Sick sinus syndrome: Secondary | ICD-10-CM

## 2018-04-11 NOTE — Progress Notes (Signed)
Remote pacemaker transmission.   

## 2018-04-12 ENCOUNTER — Encounter: Payer: Self-pay | Admitting: Cardiology

## 2018-04-22 LAB — CUP PACEART REMOTE DEVICE CHECK
Battery Remaining Longevity: 128 mo
Brady Statistic AP VP Percent: 1.2 %
Brady Statistic AP VS Percent: 93 %
Brady Statistic AS VP Percent: 1 %
Brady Statistic AS VS Percent: 5.8 %
Brady Statistic RV Percent Paced: 1.2 %
Date Time Interrogation Session: 20190716062312
Implantable Lead Implant Date: 20160518
Implantable Lead Location: 753859
Implantable Pulse Generator Implant Date: 20160518
Lead Channel Impedance Value: 530 Ohm
Lead Channel Pacing Threshold Amplitude: 0.5 V
Lead Channel Pacing Threshold Amplitude: 0.625 V
Lead Channel Pacing Threshold Pulse Width: 0.4 ms
Lead Channel Sensing Intrinsic Amplitude: 0.2 mV
Lead Channel Setting Pacing Amplitude: 0.875
MDC IDC LEAD IMPLANT DT: 20160518
MDC IDC LEAD LOCATION: 753860
MDC IDC MSMT BATTERY REMAINING PERCENTAGE: 95.5 %
MDC IDC MSMT BATTERY VOLTAGE: 3.01 V
MDC IDC MSMT LEADCHNL RA PACING THRESHOLD PULSEWIDTH: 0.4 ms
MDC IDC MSMT LEADCHNL RV IMPEDANCE VALUE: 650 Ohm
MDC IDC MSMT LEADCHNL RV SENSING INTR AMPL: 9.5 mV
MDC IDC PG SERIAL: 7759914
MDC IDC SET LEADCHNL RA PACING AMPLITUDE: 2 V
MDC IDC SET LEADCHNL RV PACING PULSEWIDTH: 0.4 ms
MDC IDC SET LEADCHNL RV SENSING SENSITIVITY: 2 mV
MDC IDC STAT BRADY RA PERCENT PACED: 87 %

## 2018-04-26 ENCOUNTER — Ambulatory Visit (INDEPENDENT_AMBULATORY_CARE_PROVIDER_SITE_OTHER): Payer: Medicare HMO | Admitting: *Deleted

## 2018-04-26 DIAGNOSIS — Z79899 Other long term (current) drug therapy: Secondary | ICD-10-CM

## 2018-04-26 DIAGNOSIS — G459 Transient cerebral ischemic attack, unspecified: Secondary | ICD-10-CM

## 2018-04-26 DIAGNOSIS — I480 Paroxysmal atrial fibrillation: Secondary | ICD-10-CM

## 2018-04-26 LAB — POCT INR: INR: 3.3 — AB (ref 2.0–3.0)

## 2018-04-26 NOTE — Patient Instructions (Signed)
Description   Hold today's dose then start taking 1 tablet daily except 1/2 tablet on Sundays.  Recheck INR 2 weeks.  Call if placed on any new medications #[phone]. keep intake of greens consistent

## 2018-05-11 ENCOUNTER — Other Ambulatory Visit: Payer: Self-pay | Admitting: Cardiology

## 2018-05-11 DIAGNOSIS — I480 Paroxysmal atrial fibrillation: Secondary | ICD-10-CM

## 2018-05-12 NOTE — Telephone Encounter (Signed)
LMOM to schedule f/u appt as is overdue

## 2018-05-15 NOTE — Telephone Encounter (Signed)
Looks like she cancelled her last appt with you.

## 2018-05-25 NOTE — Progress Notes (Signed)
Assessment and Plan:  Postnasal drip Exam benign; suggests allergic rhinitis/postnasal drip - Allegra or claritin OTC, nasal sprays as needed, increase H20, allergy hygiene explained. Follow up for any progressive symptoms  Paroxysmal atrial fibrillation (HCC) Follow up as scheduled with coumadin clinic -     Protime-INR -     warfarin (COUMADIN) 5 MG tablet; Take one tablet daily by mouth or as directed.  Further disposition pending results of labs. Discussed med's effects and SE's.   Over 15 minutes of exam, counseling, chart review, and critical decision making was performed.   Future Appointments  Date Time Provider [employer]  05/30/2018  3:00 PM [facility]  07/11/2018  8:30 AM [facility] DEVICE REMOTES [facility]  10/02/2018 11:15 AM Liane Comber, NP [facility] None  04/04/2019  2:00 PM Unk Pinto, MD [facility] None    ------------------------------------------------------------------------------------------------------------------   HPI BP 130/74   Pulse 63   Temp (!) 97.3 F (36.3 C)   Ht 5' 3.25" (1.607 m)   Wt 147 lb (66.7 kg)   SpO2 99%   BMI 25.83 kg/m   84 y.o.female presents accompanied by her daughter for evaluation of an intermittent audible "wheezing" that is primarily noted by family members, patient with dementia and unaware of the problem. Daughter shares she notes whistling sounds intermittently, doesn't seem associated with eating/exertion, denies patient getting choked or coughing with eating/drinking or pills getting stuck. She denies wheezing or dyspnea. She does endorse new allergy symptoms this year, and when sitting outdoors has had postnasal drip and watery eyes. She is somewhat hoarse today. She does not currently take allergy medication.   She is typically followed by coumadin clinic for a. Fib; patient's daughter requests we check PT/INR today as they missed last appointment and  next appointment not for another week; some concern of missed/duplicated doses this past week and would like this checked today.   Past Medical History:  Diagnosis Date  . A-fib ([place])    a. Dx 06/2013;  b. 06/2013 Echo:  EF 55-60%, no reg wma, mild AI, mod dil LA.  Marland Kitchen Allergy   . GERD (gastroesophageal reflux disease)   . Hyperlipidemia   . RA (rheumatoid arthritis) ([place])    " in my hands "  . Sinus bradycardia 07/11/2013  . TIA (transient ischemic attack)    a. with fall 05/2013 -> neg head CT and MRI/MRA;  b. Plavix started.  . Urinary incontinence   . Vitamin D deficiency      Allergies  Allergen Reactions  . Other Other (See Comments)    News Print: (freshly printed newspapers) cause her eyes to water . Allergy type reaction. Medal: itching & rash    Current Outpatient Medications on File Prior to Visit  Medication Sig  . acetaminophen (TYLENOL) 325 MG tablet Take 325 mg by mouth every 6 (six) hours as needed for mild pain.   Marland Kitchen b complex vitamins tablet Take 1 tablet by mouth daily.  . Cholecalciferol (VITAMIN D PO) Take 3,000 Units by mouth daily.   . ciprofloxacin (CIPRO) 250 MG tablet Take 1 tablet 2 x/ day with food for UTI - decrease coumadin dose in 1/2 while taking this Antibiotic.  Marland Kitchen diltiazem (CARDIZEM) 30 MG tablet Take 1 tablet (30 mg total) by mouth daily as needed. For Afib episodes  . FLUZONE HIGH-DOSE 0.5 ML SUSY TO BE ADMINISTERED BY PHARMACIST FOR IMMUNIZATION  . GLUCOSAMINE-CHONDROITIN PO Take 1 tablet by mouth daily.  Marland Kitchen MAGNESIUM OXIDE,  ANTACID, PO Take 1 tablet by mouth daily.  . metoprolol tartrate (LOPRESSOR) 25 MG tablet Take 3 tablets (75 mg) by mouth twice daily.  Marland Kitchen OVER THE COUNTER MEDICATION Take 1 tablet by mouth daily. Preserve vision vitamin  . polyethylene glycol (MIRALAX / GLYCOLAX) packet Take 17 g by mouth daily as needed (constipation).  . warfarin (COUMADIN) 5 MG tablet Take 1 by mouth daily or as directed by Coumadin Clinic   No current  facility-administered medications on file prior to visit.     ROS: all negative except above.   Physical Exam:  BP 130/74   Pulse 63   Temp (!) 97.3 F (36.3 C)   Ht 5' 3.25" (1.607 m)   Wt 147 lb (66.7 kg)   SpO2 99%   BMI 25.83 kg/m   General Appearance: Well nourished, in no apparent distress. Eyes: PERRLA, EOMs, conjunctiva no swelling or erythema Sinuses: No Frontal/maxillary tenderness ENT/Mouth: Ext aud canals clear, TMs without erythema, bulging. No erythema, swelling, or exudate on post pharynx.  Tonsils not swollen or erythematous. HOH with hearing aids.  Neck: Supple, thyroid normal, no palpable masses, asymmetry, edema.   Respiratory: Respiratory effort normal, BS equal bilaterally without rales, rhonchi, wheezing or stridor.  Cardio: RRR with no MRGs. Brisk peripheral pulses without edema.  Abdomen: Soft, + BS.  Non tender. Lymphatics: Non tender without lymphadenopathy.  Musculoskeletal: Slow gait with cane  Skin: Warm, dry without rashes, lesions, ecchymosis.  Neuro: Cranial nerves intact. Normal muscle tone, no cerebellar symptoms. Sensation intact.  Psych: Awake and oriented X 2, normal affect, Insight and Judgment poor.   Izora Ribas, NP 12:20 PM [facility]

## 2018-05-26 ENCOUNTER — Encounter: Payer: Self-pay | Admitting: Adult Health

## 2018-05-26 ENCOUNTER — Ambulatory Visit: Payer: Medicare HMO | Admitting: Adult Health

## 2018-05-26 VITALS — BP 130/74 | HR 63 | Temp 97.3°F | Ht 63.25 in | Wt 147.0 lb

## 2018-05-26 DIAGNOSIS — R0982 Postnasal drip: Secondary | ICD-10-CM | POA: Diagnosis not present

## 2018-05-26 DIAGNOSIS — I480 Paroxysmal atrial fibrillation: Secondary | ICD-10-CM | POA: Diagnosis not present

## 2018-05-26 MED ORDER — WARFARIN SODIUM 5 MG PO TABS: ORAL_TABLET | ORAL | 0 refills | Status: DC

## 2018-05-26 NOTE — Patient Instructions (Signed)
Consider adding claritin or allegra for allergy symptoms; if complaining of runny nose, can try nasal antihistamine spray (azelastine spray, etc)- if congested, try flonase (fluticasone)   Postnasal Drip Postnasal drip is the feeling of mucus going down the back of your throat. Mucus is a slimy substance that moistens and cleans your nose and throat, as well as the air pockets in face bones near your forehead and cheeks (sinuses). Small amounts of mucus pass from your nose and sinuses down the back of your throat all the time. This is normal. When you produce too much mucus or the mucus gets too thick, you can feel it. Some common causes of postnasal drip include:  Having more mucus because of: ? A cold or the flu. ? Allergies. ? Cold air. ? Certain medicines.  Having more mucus that is thicker because of: ? A sinus or nasal infection. ? Dry air. ? A food allergy.  Follow these instructions at home: Relieving discomfort  Gargle with a salt-water mixture 3-4 times a day or as needed. To make a salt-water mixture, completely dissolve -1 tsp of salt in 1 cup of warm water.  If the air in your home is dry, use a humidifier to add moisture to the air.  Use a saline spray or container (neti pot) to flush out the nose (nasal irrigation). These methods can help clear away mucus and keep the nasal passages moist. General instructions  Take over-the-counter and prescription medicines only as told by your health care provider.  Follow instructions from your health care provider about eating or drinking restrictions. You may need to avoid caffeine.  Avoid things that you know you are allergic to (allergens), like dust, mold, pollen, pets, or certain foods.  Drink enough fluid to keep your urine pale yellow.  Keep all follow-up visits as told by your health care provider. This is important. Contact a health care provider if:  You have a fever.  You have a sore throat.  You have  difficulty swallowing.  You have headache.  You have sinus pain.  You have a cough that does not go away.  The mucus from your nose becomes thick and is green or yellow in color.  You have cold or flu symptoms that last more than 10 days. Summary  Postnasal drip is the feeling of mucus going down the back of your throat.  If your health care provider approves, use nasal irrigation or a nasal spray 2?4 times a day.  Avoid things that you know you are allergic to (allergens), like dust, mold, pollen, pets, or certain foods. This information is not intended to replace advice given to you by your health care provider. Make sure you discuss any questions you have with your health care provider. Document Released: 12/27/2016 Document Revised: 12/27/2016 Document Reviewed: 12/27/2016 Elsevier Interactive Patient Education  [employer].

## 2018-05-27 LAB — PROTIME-INR
INR: 2.2 — AB
Prothrombin Time: 23 s — ABNORMAL HIGH (ref 9.0–11.5)

## 2018-05-30 ENCOUNTER — Ambulatory Visit (INDEPENDENT_AMBULATORY_CARE_PROVIDER_SITE_OTHER): Payer: Medicare HMO | Admitting: Cardiology

## 2018-05-30 DIAGNOSIS — Z5181 Encounter for therapeutic drug level monitoring: Secondary | ICD-10-CM | POA: Diagnosis not present

## 2018-05-30 DIAGNOSIS — G459 Transient cerebral ischemic attack, unspecified: Secondary | ICD-10-CM | POA: Diagnosis not present

## 2018-05-30 DIAGNOSIS — I480 Paroxysmal atrial fibrillation: Secondary | ICD-10-CM | POA: Diagnosis not present

## 2018-05-30 DIAGNOSIS — Z79899 Other long term (current) drug therapy: Secondary | ICD-10-CM

## 2018-06-17 ENCOUNTER — Other Ambulatory Visit: Payer: Self-pay | Admitting: Adult Health

## 2018-06-17 DIAGNOSIS — I480 Paroxysmal atrial fibrillation: Secondary | ICD-10-CM

## 2018-06-21 ENCOUNTER — Other Ambulatory Visit: Payer: Self-pay | Admitting: Adult Health

## 2018-06-21 DIAGNOSIS — I480 Paroxysmal atrial fibrillation: Secondary | ICD-10-CM

## 2018-06-23 ENCOUNTER — Other Ambulatory Visit: Payer: Self-pay | Admitting: *Deleted

## 2018-06-23 DIAGNOSIS — I480 Paroxysmal atrial fibrillation: Secondary | ICD-10-CM

## 2018-06-23 MED ORDER — WARFARIN SODIUM 5 MG PO TABS: ORAL_TABLET | ORAL | 0 refills | Status: DC

## 2018-06-23 NOTE — Telephone Encounter (Signed)
Pt's dtr called and stated the pt need a refill until her appt on 06/28/18. Pt missed her appt 10 days ago due to being out of town, advised that I will send in a refill at his time & pt needs to adhere to appts.

## 2018-06-28 ENCOUNTER — Ambulatory Visit (INDEPENDENT_AMBULATORY_CARE_PROVIDER_SITE_OTHER): Payer: Medicare HMO | Admitting: *Deleted

## 2018-06-28 DIAGNOSIS — Z79899 Other long term (current) drug therapy: Secondary | ICD-10-CM | POA: Diagnosis not present

## 2018-06-28 DIAGNOSIS — Z5181 Encounter for therapeutic drug level monitoring: Secondary | ICD-10-CM

## 2018-06-28 DIAGNOSIS — I480 Paroxysmal atrial fibrillation: Secondary | ICD-10-CM

## 2018-06-28 DIAGNOSIS — G459 Transient cerebral ischemic attack, unspecified: Secondary | ICD-10-CM | POA: Diagnosis not present

## 2018-06-28 LAB — POCT INR: INR: 2.6 (ref 2.0–3.0)

## 2018-06-28 NOTE — Patient Instructions (Signed)
Description   Continue taking 1 tablet daily except 1/2 tablet on Sundays.  Recheck INR 4 weeks.  Call if placed on any new medications #[phone]. keep intake of greens consistent

## 2018-07-11 ENCOUNTER — Ambulatory Visit (INDEPENDENT_AMBULATORY_CARE_PROVIDER_SITE_OTHER): Payer: Medicare HMO | Admitting: *Deleted

## 2018-07-11 DIAGNOSIS — I495 Sick sinus syndrome: Secondary | ICD-10-CM

## 2018-07-12 NOTE — Progress Notes (Signed)
Remote pacemaker transmission.   

## 2018-07-15 ENCOUNTER — Other Ambulatory Visit: Payer: Self-pay | Admitting: Internal Medicine

## 2018-07-15 DIAGNOSIS — I480 Paroxysmal atrial fibrillation: Secondary | ICD-10-CM

## 2018-07-27 DIAGNOSIS — H6123 Impacted cerumen, bilateral: Secondary | ICD-10-CM | POA: Diagnosis not present

## 2018-08-03 ENCOUNTER — Other Ambulatory Visit: Payer: Self-pay | Admitting: Internal Medicine

## 2018-08-03 DIAGNOSIS — I480 Paroxysmal atrial fibrillation: Secondary | ICD-10-CM

## 2018-09-05 ENCOUNTER — Ambulatory Visit: Payer: Medicare HMO

## 2018-09-05 DIAGNOSIS — G459 Transient cerebral ischemic attack, unspecified: Secondary | ICD-10-CM | POA: Diagnosis not present

## 2018-09-05 DIAGNOSIS — Z79899 Other long term (current) drug therapy: Secondary | ICD-10-CM | POA: Diagnosis not present

## 2018-09-05 DIAGNOSIS — I480 Paroxysmal atrial fibrillation: Secondary | ICD-10-CM

## 2018-09-05 LAB — POCT INR: INR: 3.8 — AB (ref 2.0–3.0)

## 2018-09-05 NOTE — Patient Instructions (Signed)
Description   Skip today's dosage of Coumadin, then resume same dosage 1 tablet daily except 1/2 tablet on Sundays.  Recheck INR 3 weeks.  Call if placed on any new medications #[phone]. keep intake of greens consistent

## 2018-09-16 LAB — CUP PACEART REMOTE DEVICE CHECK
Brady Statistic AP VP Percent: 1.3 %
Brady Statistic AS VP Percent: 1 %
Brady Statistic RA Percent Paced: 84 %
Brady Statistic RV Percent Paced: 1.3 %
Implantable Lead Implant Date: 20160518
Implantable Lead Location: 753860
Implantable Lead Model: 1944
Implantable Lead Model: 1948
Lead Channel Impedance Value: 650 Ohm
Lead Channel Pacing Threshold Amplitude: 0.5 V
Lead Channel Pacing Threshold Pulse Width: 0.4 ms
Lead Channel Sensing Intrinsic Amplitude: 8.8 mV
Lead Channel Setting Pacing Amplitude: 2 V
Lead Channel Setting Sensing Sensitivity: 2 mV
MDC IDC LEAD IMPLANT DT: 20160518
MDC IDC LEAD LOCATION: 753859
MDC IDC MSMT BATTERY REMAINING LONGEVITY: 128 mo
MDC IDC MSMT BATTERY REMAINING PERCENTAGE: 95.5 %
MDC IDC MSMT BATTERY VOLTAGE: 2.99 V
MDC IDC MSMT LEADCHNL RA IMPEDANCE VALUE: 540 Ohm
MDC IDC MSMT LEADCHNL RA SENSING INTR AMPL: 0.5 mV
MDC IDC MSMT LEADCHNL RV PACING THRESHOLD AMPLITUDE: 0.625 V
MDC IDC MSMT LEADCHNL RV PACING THRESHOLD PULSEWIDTH: 0.4 ms
MDC IDC PG IMPLANT DT: 20160518
MDC IDC SESS DTM: 20191015060018
MDC IDC SET LEADCHNL RV PACING AMPLITUDE: 0.875
MDC IDC SET LEADCHNL RV PACING PULSEWIDTH: 0.4 ms
MDC IDC STAT BRADY AP VS PERCENT: 89 %
MDC IDC STAT BRADY AS VS PERCENT: 9.1 %
Pulse Gen Serial Number: 7759914

## 2018-09-29 NOTE — Progress Notes (Signed)
MEDICARE ANNUAL WELLNESS VISIT AND FOLLOW UP  Assessment:    Encounter for Medicare annual wellness exam  Tachy-brady syndrome ([place]) Controlled, s/p pacemaker, followed by cardiology  Sinus bradycardia S/p pacemaker  Paroxysmal atrial fibrillation ([place]) Rate controlled, on coumadin followed by cardiology/coumadin clinic Discussed if patient falls to immediately contact office or go to ER. Discussed foods that can increase or decrease Coumadin levels. Patient understands to call the office before starting a new medication.  History of TIA (transient ischemic attack) Control blood pressure, cholesterol, glucose, increase exercise.   Essential hypertension Continue medication Monitor blood pressure at home; call if consistently over 130/80 Continue DASH diet.   Reminder to go to the ER if any CP, SOB, nausea, dizziness, severe HA, changes vision/speech, left arm numbness and tingling and jaw pain. -     Magnesium  Gastroesophageal reflux disease, esophagitis presence not specified Well managed at this time off of medications Discussed diet, avoiding triggers and other lifestyle changes  CKD stage G3b/A1, GFR 30-44 and albumin creatinine ratio <30 mg/g (HCC) Increase fluids, avoid NSAIDS, monitor sugars, will monitor -     COMPLETE METABOLIC PANEL WITH GFR  Vitamin D deficiency Continue supplementation Check vitamin D level -     VITAMIN D 25 Hydroxy (Vit-D Deficiency, Fractures)  Other abnormal glucose Recent A1Cs at goal Discussed diet/exercise, weight management  Defer A1C; check CMP -     COMPLETE METABOLIC PANEL WITH GFR  Medication management -     CBC with Differential/Platelet -     COMPLETE METABOLIC PANEL WITH GFR -     Magnesium  Long term current use of anticoagulant therapy Followed by coumadin clinic  Hyperlipidemia, unspecified hyperlipidemia type At goal by lifestyle modification Continue low cholesterol diet and exercise.  Check lipid panel  annually at CPE -     TSH  Need for immunization against influenza -     Flu vaccine HIGH DOSE PF  Fatigue, unspecified type -     CBC with Differential/Platelet -     TSH -     Vitamin B12  Estrogen deficiency -     DG Bone Density; Future  Mild dementia ([place]) Son is living with her, helps with meds, housework No falls, family and patient feels she is doing well without need for additional resources   Over 40 minutes of exam, counseling, chart review and critical decision making was performed Future Appointments  Date Time Provider [place]  10/04/2018  1:45 PM [facility]  10/10/2018  9:00 AM [facility] DEVICE REMOTES [facility]  04/04/2019  2:00 PM Unk Pinto, MD [facility] None     Plan:   During the course of the visit the patient was educated and counseled about appropriate screening and preventive services including:    Pneumococcal vaccine   Prevnar 13  Influenza vaccine  Td vaccine  Screening electrocardiogram  Bone densitometry screening  Colorectal cancer screening  Diabetes screening  Glaucoma screening  Nutrition counseling   Advanced directives: requested   Subjective:  Erica Carroll is a 85 y.o. female who presents for Medicare Annual Wellness Visit and 6 month follow up. In Sept 2014 when she presented with rapid Afib. She had a TIA at that time & was started on Plavix and after a 2sd TIA was switched to Coumadin. In 2016, she had a St Jude PPM implanted by Dr [name] for SSS w/ Tachy/Brady Syn.   She has some mild memory loss, forgetting how to cook,  recipes, etc. She lives at home, but her son has moved in with her and helps with housework, cooking, etc and checks on her throughout the day. Her daughter drives her to Dr's appointments.  She has not fallen this past year, reports steady with current 4 point cane. She denies incontinence.   Today she presents accompanied by  her daughter, some fatigue, and does have intermittent tingling in fingers, wonders about B12 levels as this has been low in the past but hasn't had checked in recent years. Denies neck pain, strength intact.   She follows with a. Fib clinic for coumadin checks. Patient's last INR is  Lab Results  Component Value Date   INR 3.8 (A) 09/05/2018   INR 2.6 06/28/2018   INR 2.2 (H) 05/26/2018   Dose was changed and has follow up sheduled 10/04/2018.   BMI is Body mass index is 26.19 kg/m., she has not been working on diet and exercise. Wt Readings from Last 3 Encounters:  10/02/18 149 lb (67.6 kg)  05/26/18 147 lb (66.7 kg)  03/28/18 148 lb 9.6 oz (67.4 kg)    Her blood pressure has been controlled at home, today their BP is BP: 108/64 She does workout. She denies chest pain, shortness of breath, dizziness.   She is not on cholesterol medication and denies myalgias. Her cholesterol is at goal. The cholesterol last visit was:   Lab Results  Component Value Date   CHOL 145 03/28/2018   HDL 47 (L) 03/28/2018   LDLCALC 77 03/28/2018   TRIG 124 03/28/2018   CHOLHDL 3.1 03/28/2018    She has been working on diet and exercise for glucose management, and denies increased appetite, nausea, paresthesia of the feet, polydipsia, polyuria and visual disturbances. Last A1C in the office was:  Lab Results  Component Value Date   HGBA1C 5.4 03/28/2018   Last GFR: Lab Results  Component Value Date   GFRNONAA 37 (L) 03/28/2018   Patient is on Vitamin D supplement.   Lab Results  Component Value Date   VD25OH 54 03/28/2018       Medication Review: Current Outpatient Medications on File Prior to Visit  Medication Sig Dispense Refill  . acetaminophen (TYLENOL) 325 MG tablet Take 325 mg by mouth every 6 (six) hours as needed for mild pain.     Marland Kitchen b complex vitamins tablet Take 1 tablet by mouth daily.    . Cholecalciferol (VITAMIN D PO) Take 3,000 Units by mouth daily.     Marland Kitchen diltiazem  (CARDIZEM) 30 MG tablet Take 1 tablet (30 mg total) by mouth daily as needed. For Afib episodes 30 tablet 10  . GLUCOSAMINE-CHONDROITIN PO Take 1 tablet by mouth daily.    Marland Kitchen MAGNESIUM OXIDE, ANTACID, PO Take 1 tablet by mouth daily.    . metoprolol tartrate (LOPRESSOR) 25 MG tablet Take 3 tablets (75 mg) by mouth twice daily. 540 tablet 3  . OVER THE COUNTER MEDICATION Take 1 tablet by mouth daily. Preserve vision vitamin    . polyethylene glycol (MIRALAX / GLYCOLAX) packet Take 17 g by mouth daily as needed (constipation).    . warfarin (COUMADIN) 5 MG tablet TAKE ONE TABLET DAILY BY MOUTH OR AS DIRECTED. 35 tablet 3   No current facility-administered medications on file prior to visit.     Allergies  Allergen Reactions  . Other Other (See Comments)    News Print: (freshly printed newspapers) cause her eyes to water . Allergy type reaction. Medal: itching &  rash    Current Problems (verified) Patient Active Problem List   Diagnosis Date Noted  . Mild dementia ([place]) 10/02/2018  . Urinary incontinence   . Environmental allergies   . Tachy-brady syndrome ([place]) 02/12/2015  . CKD stage G3b/A1, GFR 30-44 and albumin creatinine ratio <30 mg/g (HCC) 01/28/2015  . Essential hypertension 01/03/2014  . Other abnormal glucose 01/03/2014  . Medication management 10/31/2013  . Hyperlipidemia   . Vitamin D deficiency   . Long term current use of anticoagulant therapy 07/16/2013  . Sinus bradycardia 07/11/2013  . Paroxysmal atrial fibrillation ([place]) 07/10/2013  . GERD (gastroesophageal reflux disease) 07/10/2013  . History of TIA (transient ischemic attack) 06/25/2013    Screening Tests Immunization History  Administered Date(s) Administered  . Influenza, High Dose Seasonal PF 07/18/2014, 10/02/2018  . Influenza,inj,Quad PF,6+ Mos 07/26/2013, 07/07/2016  . Influenza-Unspecified 05/28/2012  . Pneumococcal Conjugate-13 03/02/2017  . Pneumococcal-Unspecified 11/25/2008  . Td 09/13/2006   . Tdap 12/13/2015    Preventative care: Last colonoscopy: patient declined persistently, now deferred due to age Last mammogram: 03/2017 Last pap smear/pelvic exam: remote   DEXA: never - will order   Prior vaccinations: TD or Tdap: 2017  Influenza: Today 09/2018  Pneumococcal: 2010 Prevnar13: 2018 Shingles/Zostavax: Declines   Names of Other Physician/Practitioners you currently use: 1. [facility] here for primary care 2. Dr. Katy Fitch, eye doctor, last visit 2017?  3. Dr. ? , dentist, last visit 2018  Patient Care Team: Unk Pinto, MD as PCP - General (Internal Medicine) Stanford Breed Denice Bors, MD as Consulting Physician (Cardiology) Clent Jacks, MD as Consulting Physician (Ophthalmology)  SURGICAL HISTORY She  has a past surgical history that includes Eye surgery (Right, 1994); Eye surgery (Left, 1998); Appendectomy (1936); and Cardiac catheterization (N/A, 02/12/2015). FAMILY HISTORY Her family history includes CVA in her sister; Heart disease in her brother, brother, mother, and sister; Thyroid disease in her sister. SOCIAL HISTORY She  reports that she has never smoked. She has never used smokeless tobacco. She reports current alcohol use. She reports that she does not use drugs.   MEDICARE WELLNESS OBJECTIVES: Physical activity: Current Exercise Habits: Home exercise routine, Type of exercise: walking, Time (Minutes): 10, Frequency (Times/Week): 7, Weekly Exercise (Minutes/Week): 70, Intensity: Mild, Exercise limited by: neurologic condition(s) Cardiac risk factors: Cardiac Risk Factors include: advanced age (>42men, >20 women);dyslipidemia;hypertension;sedentary lifestyle Depression/mood screen:   Depression screen [facility] 2/9 10/02/2018  Decreased Interest 0  Down, Depressed, Hopeless 0  PHQ - 2 Score 0    ADLs:  In your present state of health, do you have any difficulty performing the following activities: 10/02/2018 03/28/2018   Hearing? Y N  Comment has bilateral hearing aids -  Vision? N N  Difficulty concentrating or making decisions? Y N  Comment forgeting how to cook, son is staying with her, helps with meds, cleaning cooking -  Walking or climbing stairs? Y N  Comment walks with cane, limits stairs, ok in home -  Dressing or bathing? N N  Doing errands, shopping? Y N  Comment driven by family  -  Conservation officer, nature and eating ? Y -  Comment family helps -  Using the Toilet? N -  In the past six months, have you accidently leaked urine? N -  Do you have problems with loss of bowel control? N -  Managing your Medications? Y -  Comment family helps -  Managing your Finances? Y -  Comment has POA, family pays bills -  Housekeeping or managing your Housekeeping? Y -  Comment family helps -  Some recent data might be hidden     Cognitive Testing  Alert? Yes  Normal Appearance?Yes  Oriented to person? Yes  Place? Yes   Time? Yes  Recall of three objects?  Yes 3/3 - after repitition  Can perform simple calculations? Yes  Displays appropriate judgment?Yes  Can read the correct time from a watch face?Yes  EOL planning: Does Patient Have a Medical Advance Directive?: Yes Type of Advance Directive: Healthcare Power of Attorney, Living will Does patient want to make changes to medical advance directive?: Yes (MAU/Ambulatory/Procedural Areas - Information given) Copy of [place] in Chart?: No - copy requested  Review of Systems  Constitutional: Positive for malaise/fatigue. Negative for weight loss.  HENT: Negative for hearing loss and tinnitus.   Eyes: Negative for blurred vision and double vision.  Respiratory: Negative for cough, sputum production, shortness of breath and wheezing.   Cardiovascular: Negative for chest pain, palpitations, orthopnea, claudication, leg swelling and PND.  Gastrointestinal: Negative for abdominal pain, blood in stool, constipation, diarrhea, heartburn,  melena, nausea and vomiting.  Genitourinary: Negative.   Musculoskeletal: Negative for falls, joint pain and myalgias.  Skin: Negative for rash.  Neurological: Negative for dizziness, tingling, sensory change, weakness and headaches.  Endo/Heme/Allergies: Negative for polydipsia.  Psychiatric/Behavioral: Positive for memory loss (forgetting recipes, how to cook, meds without help). Negative for depression, substance abuse and suicidal ideas. The patient is not nervous/anxious and does not have insomnia.   All other systems reviewed and are negative.    Objective:     Today's Vitals   10/02/18 1112  BP: 108/64  Pulse: 74  Temp: (!) 97.3 F (36.3 C)  SpO2: 94%  Weight: 149 lb (67.6 kg)  Height: 5' 3.25" (1.607 m)   Body mass index is 26.19 kg/m.  General appearance: alert, no distress, WD/WN, female HEENT: normocephalic, sclerae anicteric, TMs pearly, nares patent, no discharge or erythema, pharynx normal, very HOH with bilateral hearing aids Oral cavity: MMM, no lesions Neck: supple, no lymphadenopathy, no thyromegaly, no masses Heart: RRR, normal S1, S2, no murmurs Lungs: CTA bilaterally, no wheezes, rhonchi, or rales Abdomen: +bs, soft, non tender, non distended, no masses, no hepatomegaly, no splenomegaly Musculoskeletal: nontender, no swelling, no obvious deformity Extremities: no edema, no cyanosis, no clubbing Pulses: 2+ symmetric, upper and lower extremities, normal cap refill Neurological: alert, oriented x 3, CN2-12 intact, strength normal upper extremities and lower extremities, sensation normal throughout, DTRs 2+ throughout, no cerebellar signs, gait slow but steady with 4 point cane, slowed though process Psychiatric: normal affect, behavior normal, pleasant   Medicare Attestation I have personally reviewed: The patient's medical and social history Their use of alcohol, tobacco or illicit drugs Their current medications and supplements The patient's functional  ability including ADLs,fall risks, home safety risks, cognitive, and hearing and visual impairment Diet and physical activities Evidence for depression or mood disorders  The patient's weight, height, BMI, and visual acuity have been recorded in the chart.  I have made referrals, counseling, and provided education to the patient based on review of the above and I have provided the patient with a written personalized care plan for preventive services.     Izora Ribas, NP   10/02/2018

## 2018-10-02 ENCOUNTER — Encounter: Payer: Self-pay | Admitting: Adult Health

## 2018-10-02 ENCOUNTER — Ambulatory Visit (INDEPENDENT_AMBULATORY_CARE_PROVIDER_SITE_OTHER): Payer: Medicare HMO | Admitting: Adult Health

## 2018-10-02 VITALS — BP 108/64 | HR 74 | Temp 97.3°F | Ht 63.25 in | Wt 149.0 lb

## 2018-10-02 DIAGNOSIS — I480 Paroxysmal atrial fibrillation: Secondary | ICD-10-CM

## 2018-10-02 DIAGNOSIS — E785 Hyperlipidemia, unspecified: Secondary | ICD-10-CM | POA: Diagnosis not present

## 2018-10-02 DIAGNOSIS — F0390 Unspecified dementia without behavioral disturbance: Secondary | ICD-10-CM

## 2018-10-02 DIAGNOSIS — E559 Vitamin D deficiency, unspecified: Secondary | ICD-10-CM | POA: Diagnosis not present

## 2018-10-02 DIAGNOSIS — Z23 Encounter for immunization: Secondary | ICD-10-CM | POA: Diagnosis not present

## 2018-10-02 DIAGNOSIS — R32 Unspecified urinary incontinence: Secondary | ICD-10-CM | POA: Diagnosis not present

## 2018-10-02 DIAGNOSIS — Z0001 Encounter for general adult medical examination with abnormal findings: Secondary | ICD-10-CM

## 2018-10-02 DIAGNOSIS — N183 Chronic kidney disease, stage 3 (moderate): Secondary | ICD-10-CM | POA: Diagnosis not present

## 2018-10-02 DIAGNOSIS — Z79899 Other long term (current) drug therapy: Secondary | ICD-10-CM

## 2018-10-02 DIAGNOSIS — R5383 Other fatigue: Secondary | ICD-10-CM

## 2018-10-02 DIAGNOSIS — R001 Bradycardia, unspecified: Secondary | ICD-10-CM | POA: Diagnosis not present

## 2018-10-02 DIAGNOSIS — Z8673 Personal history of transient ischemic attack (TIA), and cerebral infarction without residual deficits: Secondary | ICD-10-CM | POA: Diagnosis not present

## 2018-10-02 DIAGNOSIS — M069 Rheumatoid arthritis, unspecified: Secondary | ICD-10-CM | POA: Diagnosis not present

## 2018-10-02 DIAGNOSIS — R6889 Other general symptoms and signs: Secondary | ICD-10-CM

## 2018-10-02 DIAGNOSIS — I10 Essential (primary) hypertension: Secondary | ICD-10-CM | POA: Diagnosis not present

## 2018-10-02 DIAGNOSIS — Z7901 Long term (current) use of anticoagulants: Secondary | ICD-10-CM

## 2018-10-02 DIAGNOSIS — I495 Sick sinus syndrome: Secondary | ICD-10-CM

## 2018-10-02 DIAGNOSIS — N1832 Chronic kidney disease, stage 3b: Secondary | ICD-10-CM

## 2018-10-02 DIAGNOSIS — F03A0 Unspecified dementia, mild, without behavioral disturbance, psychotic disturbance, mood disturbance, and anxiety: Secondary | ICD-10-CM

## 2018-10-02 DIAGNOSIS — E2839 Other primary ovarian failure: Secondary | ICD-10-CM

## 2018-10-02 DIAGNOSIS — R7309 Other abnormal glucose: Secondary | ICD-10-CM | POA: Diagnosis not present

## 2018-10-02 DIAGNOSIS — K219 Gastro-esophageal reflux disease without esophagitis: Secondary | ICD-10-CM

## 2018-10-02 DIAGNOSIS — Z0000 Encounter for general adult medical examination without abnormal findings: Secondary | ICD-10-CM

## 2018-10-02 NOTE — Patient Instructions (Addendum)
Erica Carroll , Thank you for taking time to come for your Medicare Wellness Visit. I appreciate your ongoing commitment to your health goals. Please review the following plan we discussed and let me know if I can assist you in the future.   These are the goals we discussed: Goals    . DIET - INCREASE WATER INTAKE     65 fluid ounces    . exercise 20-30 min daily        This is a list of the screening recommended for you and due dates:  Health Maintenance  Topic Date Due  . DEXA scan (bone density measurement)  08/02/1989  . Flu Shot  04/27/2018  . Tetanus Vaccine  12/12/2025  . Pneumonia vaccines  Completed   Schedule a dentist and vision appointment  Schedule mammogram with bone density exam   HOW TO SCHEDULE A MAMMOGRAM  [facility]  7 a.m.-6:30 p.m., Monday 7 a.m.-5 p.m., Tuesday-Friday Schedule an appointment by calling [phone].  [facility] Schedule an appointment by calling [phone].    Chronic Kidney Disease, Adult Chronic kidney disease (CKD) happens when the kidneys are damaged over a long period of time. The kidneys are two organs that help with:  Getting rid of waste and extra fluid from the blood.  Making hormones that maintain the amount of fluid in your tissues and blood vessels.  Making sure that the body has the right amount of fluids and chemicals. Most of the time, CKD does not go away, but it can usually be controlled. Steps must be taken to slow down the kidney damage or to stop it from getting worse. If this is not done, the kidneys may stop working. Follow these instructions at home: Medicines  Take over-the-counter and prescription medicines only as told by your doctor. You may need to change the amount of medicines you take.  Do not take any new medicines unless your doctor says it is okay. Many medicines can make your kidney damage worse.  Do not take any vitamin and supplements unless your  doctor says it is okay. Many vitamins and supplements can make your kidney damage worse. General instructions  Follow a diet as told by your doctor. You may need to stay away from: ? Alcohol. ? Salty foods. ? Foods that are high in:  Potassium.  Calcium.  Protein.  Do not use any products that contain nicotine or tobacco, such as cigarettes and e-cigarettes. If you need help quitting, ask your doctor.  Keep track of your blood pressure at home. Tell your doctor about any changes.  If you have diabetes, keep track of your blood sugar as told by your doctor.  Try to stay at a healthy weight. If you need help, ask your doctor.  Exercise at least 30 minutes a day, 5 days a week.  Stay up-to-date with your shots (immunizations) as told by your doctor.  Keep all follow-up visits as told by your doctor. This is important. Contact a doctor if:  Your symptoms get worse.  You have new symptoms. Get help right away if:  You have symptoms of end-stage kidney disease. These may include: ? Headaches. ? Numbness in your hands or feet. ? Easy bruising. ? Having hiccups often. ? Chest pain. ? Shortness of breath. ? Stopping of menstrual periods in women.  You have a fever.  You have very little pee (urine).  You have pain or bleeding when you pee. Summary  Chronic kidney disease (CKD) happens when the kidneys are damaged over a long period of time.  Most of the time, this condition does not go away, but it can usually be controlled. Steps must be taken to slow down the kidney damage or to stop it from getting worse.  Treatment may include a combination of medicines and lifestyle changes. This information is not intended to replace advice given to you by your health care provider. Make sure you discuss any questions you have with your health care provider. Document Released: 12/08/2009 Document Revised: 10/18/2016 Document Reviewed: 10/18/2016 Elsevier Interactive Patient  Education  2019 [employer].

## 2018-10-03 ENCOUNTER — Other Ambulatory Visit: Payer: Self-pay | Admitting: Adult Health

## 2018-10-03 LAB — CBC WITH DIFFERENTIAL/PLATELET
ABSOLUTE MONOCYTES: 540 {cells}/uL (ref 200–950)
BASOS PCT: 0.5 %
Basophils Absolute: 42 cells/uL (ref 0–200)
EOS ABS: 149 {cells}/uL (ref 15–500)
Eosinophils Relative: 1.8 %
HCT: 36.9 % (ref 35.0–45.0)
Hemoglobin: 12.4 g/dL (ref 11.7–15.5)
Lymphs Abs: 1718 cells/uL (ref 850–3900)
MCH: 30.8 pg (ref 27.0–33.0)
MCHC: 33.6 g/dL (ref 32.0–36.0)
MCV: 91.8 fL (ref 80.0–100.0)
MPV: 10.3 fL (ref 7.5–12.5)
Monocytes Relative: 6.5 %
NEUTROS PCT: 70.5 %
Neutro Abs: 5852 cells/uL (ref 1500–7800)
PLATELETS: 216 10*3/uL (ref 140–400)
RBC: 4.02 10*6/uL (ref 3.80–5.10)
RDW: 12.4 % (ref 11.0–15.0)
TOTAL LYMPHOCYTE: 20.7 %
WBC: 8.3 10*3/uL (ref 3.8–10.8)

## 2018-10-03 LAB — VITAMIN B12: Vitamin B-12: 496 pg/mL (ref 200–1100)

## 2018-10-03 LAB — COMPLETE METABOLIC PANEL WITH GFR
AG Ratio: 1.7 (calc) (ref 1.0–2.5)
ALT: 9 U/L (ref 6–29)
AST: 16 U/L (ref 10–35)
Albumin: 4.2 g/dL (ref 3.6–5.1)
Alkaline phosphatase (APISO): 47 U/L (ref 33–130)
BUN/Creatinine Ratio: 11 (calc) (ref 6–22)
BUN: 15 mg/dL (ref 7–25)
CO2: 28 mmol/L (ref 20–32)
CREATININE: 1.41 mg/dL — AB (ref 0.60–0.88)
Calcium: 9.4 mg/dL (ref 8.6–10.4)
Chloride: 105 mmol/L (ref 98–110)
GFR, EST NON AFRICAN AMERICAN: 32 mL/min/{1.73_m2} — AB (ref >=60)
GFR, Est African American: 37 mL/min/{1.73_m2} — ABNORMAL LOW (ref >=60)
GLOBULIN: 2.5 g/dL (ref 1.9–3.7)
GLUCOSE: 90 mg/dL (ref 65–99)
Potassium: 5 mmol/L (ref 3.5–5.3)
Sodium: 141 mmol/L (ref 135–146)
Total Bilirubin: 0.5 mg/dL (ref 0.2–1.2)
Total Protein: 6.7 g/dL (ref 6.1–8.1)

## 2018-10-03 LAB — TSH: TSH: 1.06 mIU/L (ref 0.40–4.50)

## 2018-10-03 LAB — MAGNESIUM: Magnesium: 2.1 mg/dL (ref 1.5–2.5)

## 2018-10-03 LAB — VITAMIN D 25 HYDROXY (VIT D DEFICIENCY, FRACTURES): VIT D 25 HYDROXY: 43 ng/mL (ref 30–100)

## 2018-10-04 ENCOUNTER — Ambulatory Visit: Payer: Medicare HMO | Admitting: Pharmacist

## 2018-10-04 DIAGNOSIS — I480 Paroxysmal atrial fibrillation: Secondary | ICD-10-CM

## 2018-10-04 DIAGNOSIS — Z8673 Personal history of transient ischemic attack (TIA), and cerebral infarction without residual deficits: Secondary | ICD-10-CM | POA: Diagnosis not present

## 2018-10-04 DIAGNOSIS — Z79899 Other long term (current) drug therapy: Secondary | ICD-10-CM

## 2018-10-04 LAB — POCT INR: INR: 3 (ref 2.0–3.0)

## 2018-10-04 NOTE — Patient Instructions (Signed)
Continue same dosage 1 tablet daily except 1/2 tablet on Sundays.  Recheck INR 3 weeks.  Call if placed on any new medications #[phone]. keep intake of greens consistent

## 2018-10-10 ENCOUNTER — Ambulatory Visit (INDEPENDENT_AMBULATORY_CARE_PROVIDER_SITE_OTHER): Payer: Medicare HMO

## 2018-10-10 DIAGNOSIS — I495 Sick sinus syndrome: Secondary | ICD-10-CM | POA: Diagnosis not present

## 2018-10-11 NOTE — Progress Notes (Signed)
Remote pacemaker transmission.   

## 2018-10-14 LAB — CUP PACEART REMOTE DEVICE CHECK
Battery Remaining Percentage: 95.5 %
Battery Voltage: 2.99 V
Brady Statistic AP VP Percent: 1.3 %
Brady Statistic AP VS Percent: 87 %
Brady Statistic AS VS Percent: 11 %
Brady Statistic RA Percent Paced: 81 %
Brady Statistic RV Percent Paced: 1.3 %
Date Time Interrogation Session: 20200114114927
Implantable Lead Implant Date: 20160518
Implantable Lead Implant Date: 20160518
Implantable Lead Location: 753859
Implantable Lead Location: 753860
Implantable Lead Model: 1944
Implantable Lead Model: 1948
Implantable Pulse Generator Implant Date: 20160518
Lead Channel Impedance Value: 510 Ohm
Lead Channel Impedance Value: 640 Ohm
Lead Channel Pacing Threshold Amplitude: 0.5 V
Lead Channel Pacing Threshold Amplitude: 0.625 V
Lead Channel Pacing Threshold Pulse Width: 0.4 ms
Lead Channel Pacing Threshold Pulse Width: 0.4 ms
Lead Channel Sensing Intrinsic Amplitude: 0.5 mV
Lead Channel Setting Pacing Amplitude: 0.875
Lead Channel Setting Pacing Amplitude: 2 V
Lead Channel Setting Sensing Sensitivity: 2 mV
MDC IDC MSMT BATTERY REMAINING LONGEVITY: 128 mo
MDC IDC MSMT LEADCHNL RV SENSING INTR AMPL: 8.3 mV
MDC IDC SET LEADCHNL RV PACING PULSEWIDTH: 0.4 ms
MDC IDC STAT BRADY AS VP PERCENT: 1 %
Pulse Gen Model: 2240
Pulse Gen Serial Number: 7759914

## 2018-10-20 ENCOUNTER — Encounter: Payer: Medicare HMO | Admitting: Nurse Practitioner

## 2018-11-09 ENCOUNTER — Telehealth: Payer: Self-pay | Admitting: Nurse Practitioner

## 2018-11-09 ENCOUNTER — Other Ambulatory Visit: Payer: Self-pay | Admitting: *Deleted

## 2018-11-09 MED ORDER — METOPROLOL TARTRATE 25 MG PO TABS: ORAL_TABLET | ORAL | 0 refills | Status: DC

## 2018-11-09 NOTE — Telephone Encounter (Signed)
New message    *STAT* If patient is at the pharmacy, call can be transferred to refill team.   1. Which medications need to be refilled? (please list name of each medication and dose if known) Metoprolol 25mg   2. Which pharmacy/location (including street and city if local pharmacy) is medication to be sent to? [facility]   3. Do they need a 30 day or 90 day supply? [place]

## 2018-11-23 ENCOUNTER — Ambulatory Visit: Payer: Medicare HMO | Admitting: *Deleted

## 2018-11-23 DIAGNOSIS — I480 Paroxysmal atrial fibrillation: Secondary | ICD-10-CM | POA: Diagnosis not present

## 2018-11-23 DIAGNOSIS — Z79899 Other long term (current) drug therapy: Secondary | ICD-10-CM | POA: Diagnosis not present

## 2018-11-23 DIAGNOSIS — Z8673 Personal history of transient ischemic attack (TIA), and cerebral infarction without residual deficits: Secondary | ICD-10-CM

## 2018-11-23 LAB — POCT INR: INR: 3.1 — AB (ref 2.0–3.0)

## 2018-11-23 NOTE — Patient Instructions (Signed)
Description   Today take 1/2 tablet then change dosage to 1 tablet daily except 1/2 tablet on Sundays and Thursdays.  Recheck INR 3 weeks.  Call if placed on any new medications #[phone]- [phone]. keep intake of greens consistent

## 2018-12-07 ENCOUNTER — Encounter: Payer: Self-pay | Admitting: Internal Medicine

## 2018-12-12 ENCOUNTER — Telehealth: Payer: Self-pay

## 2018-12-12 NOTE — Telephone Encounter (Signed)
Spoke with daughter, [name], regarding potential transition to apixaban. Reviewed differences between DOACs and warfarin (no diet interaction, less medication interactions, less frequent follow-up). Daughter is interested in transitioning her mother, but cost may be prohibitive. Provided her the number for [employer]'s Extra Help With Medicare Prescriptions. She will call them and see if she is eligible and call us back if they wish to transition.

## 2018-12-12 NOTE — Telephone Encounter (Signed)
Called pt's daughter regarding her upcoming routine appointment. Daughter states her mother is doing well and does not need anything at this time. She would like to cancel her mother's appt on 3/23 out of concern for COVID-19. However, she would like to speak with a pharmacist regarding pt's anticoagulation and upcoming coumadin checks. She is interested in switching to a NOAC or home health visits to reduce time out of the house.  A message has been sent to anticoagulation clinic. Pt's OV with Dr [name] has been cancelled. She understands we will reschedule when situation is safer.

## 2018-12-18 ENCOUNTER — Encounter: Payer: Medicare HMO | Admitting: Internal Medicine

## 2019-01-08 ENCOUNTER — Other Ambulatory Visit: Payer: Self-pay | Admitting: Pharmacist

## 2019-01-08 DIAGNOSIS — I480 Paroxysmal atrial fibrillation: Secondary | ICD-10-CM

## 2019-01-08 MED ORDER — WARFARIN SODIUM 5 MG PO TABS: ORAL_TABLET | ORAL | 0 refills | Status: DC

## 2019-01-08 NOTE — Telephone Encounter (Signed)
Received request for warfarin refill. Pt is overdue to follow up. Looks as though were discussing NOAC daughter declined change to NOAC at this time.

## 2019-01-09 ENCOUNTER — Other Ambulatory Visit: Payer: Self-pay

## 2019-01-09 ENCOUNTER — Ambulatory Visit (INDEPENDENT_AMBULATORY_CARE_PROVIDER_SITE_OTHER): Payer: Medicare HMO | Admitting: *Deleted

## 2019-01-09 DIAGNOSIS — I495 Sick sinus syndrome: Secondary | ICD-10-CM | POA: Diagnosis not present

## 2019-01-09 LAB — CUP PACEART REMOTE DEVICE CHECK
Battery Remaining Longevity: 130 mo
Battery Remaining Percentage: 95.5 %
Battery Voltage: 2.99 V
Brady Statistic AP VP Percent: 1.3 %
Brady Statistic AP VS Percent: 86 %
Brady Statistic AS VP Percent: 1 %
Brady Statistic AS VS Percent: 12 %
Brady Statistic RA Percent Paced: 80 %
Brady Statistic RV Percent Paced: 1.3 %
Date Time Interrogation Session: 20200414063247
Implantable Lead Implant Date: 20160518
Implantable Lead Implant Date: 20160518
Implantable Lead Location: 753859
Implantable Lead Location: 753860
Implantable Lead Model: 1944
Implantable Lead Model: 1948
Implantable Pulse Generator Implant Date: 20160518
Lead Channel Impedance Value: 540 Ohm
Lead Channel Impedance Value: 650 Ohm
Lead Channel Pacing Threshold Amplitude: 0.5 V
Lead Channel Pacing Threshold Amplitude: 0.625 V
Lead Channel Pacing Threshold Pulse Width: 0.4 ms
Lead Channel Pacing Threshold Pulse Width: 0.4 ms
Lead Channel Sensing Intrinsic Amplitude: 0.5 mV
Lead Channel Sensing Intrinsic Amplitude: 10 mV
Lead Channel Setting Pacing Amplitude: 0.875
Lead Channel Setting Pacing Amplitude: 2 V
Lead Channel Setting Pacing Pulse Width: 0.4 ms
Lead Channel Setting Sensing Sensitivity: 2 mV
Pulse Gen Model: 2240
Pulse Gen Serial Number: 7759914

## 2019-01-10 ENCOUNTER — Telehealth: Payer: Self-pay

## 2019-01-10 NOTE — Telephone Encounter (Signed)

## 2019-01-11 ENCOUNTER — Ambulatory Visit (INDEPENDENT_AMBULATORY_CARE_PROVIDER_SITE_OTHER): Payer: Medicare HMO | Admitting: Pharmacist

## 2019-01-11 ENCOUNTER — Other Ambulatory Visit: Payer: Self-pay

## 2019-01-11 ENCOUNTER — Other Ambulatory Visit: Payer: Self-pay | Admitting: Internal Medicine

## 2019-01-11 DIAGNOSIS — Z79899 Other long term (current) drug therapy: Secondary | ICD-10-CM | POA: Diagnosis not present

## 2019-01-11 DIAGNOSIS — I480 Paroxysmal atrial fibrillation: Secondary | ICD-10-CM | POA: Diagnosis not present

## 2019-01-11 DIAGNOSIS — Z8673 Personal history of transient ischemic attack (TIA), and cerebral infarction without residual deficits: Secondary | ICD-10-CM

## 2019-01-11 LAB — POCT INR: INR: 2.5 (ref 2.0–3.0)

## 2019-01-11 MED ORDER — WARFARIN SODIUM 5 MG PO TABS: ORAL_TABLET | ORAL | 0 refills | Status: DC

## 2019-01-16 ENCOUNTER — Encounter: Payer: Self-pay | Admitting: Cardiology

## 2019-01-16 NOTE — Progress Notes (Signed)
Remote pacemaker transmission.   

## 2019-02-05 ENCOUNTER — Other Ambulatory Visit: Payer: Self-pay | Admitting: Pharmacist

## 2019-02-05 DIAGNOSIS — I480 Paroxysmal atrial fibrillation: Secondary | ICD-10-CM

## 2019-02-05 MED ORDER — WARFARIN SODIUM 5 MG PO TABS: ORAL_TABLET | ORAL | 1 refills | Status: DC

## 2019-02-06 ENCOUNTER — Telehealth: Payer: Self-pay

## 2019-02-06 DIAGNOSIS — I480 Paroxysmal atrial fibrillation: Secondary | ICD-10-CM

## 2019-02-06 MED ORDER — WARFARIN SODIUM 5 MG PO TABS: ORAL_TABLET | ORAL | 0 refills | Status: DC

## 2019-02-06 NOTE — Telephone Encounter (Signed)
rx sent

## 2019-02-14 ENCOUNTER — Telehealth: Payer: Self-pay

## 2019-02-14 NOTE — Telephone Encounter (Signed)
lmom for prescreen  

## 2019-02-21 ENCOUNTER — Emergency Department (HOSPITAL_COMMUNITY): Payer: Medicare HMO

## 2019-02-21 ENCOUNTER — Telehealth: Payer: Self-pay

## 2019-02-21 ENCOUNTER — Other Ambulatory Visit: Payer: Self-pay

## 2019-02-21 ENCOUNTER — Inpatient Hospital Stay (HOSPITAL_COMMUNITY)
Admission: EM | Admit: 2019-02-21 | Discharge: 2019-02-28 | DRG: 536 | Disposition: A | Discharge status: Home Health | Payer: Medicare HMO | Attending: Family Medicine | Admitting: Family Medicine

## 2019-02-21 ENCOUNTER — Encounter (HOSPITAL_COMMUNITY): Payer: Self-pay | Admitting: Emergency Medicine

## 2019-02-21 DIAGNOSIS — I129 Hypertensive chronic kidney disease with stage 1 through stage 4 chronic kidney disease, or unspecified chronic kidney disease: Secondary | ICD-10-CM | POA: Diagnosis present

## 2019-02-21 DIAGNOSIS — Z03818 Encounter for observation for suspected exposure to other biological agents ruled out: Secondary | ICD-10-CM | POA: Diagnosis not present

## 2019-02-21 DIAGNOSIS — I10 Essential (primary) hypertension: Secondary | ICD-10-CM | POA: Diagnosis not present

## 2019-02-21 DIAGNOSIS — S329XXA Fracture of unspecified parts of lumbosacral spine and pelvis, initial encounter for closed fracture: Secondary | ICD-10-CM

## 2019-02-21 DIAGNOSIS — W19XXXA Unspecified fall, initial encounter: Secondary | ICD-10-CM

## 2019-02-21 DIAGNOSIS — W010XXA Fall on same level from slipping, tripping and stumbling without subsequent striking against object, initial encounter: Secondary | ICD-10-CM | POA: Diagnosis present

## 2019-02-21 DIAGNOSIS — Z823 Family history of stroke: Secondary | ICD-10-CM

## 2019-02-21 DIAGNOSIS — E559 Vitamin D deficiency, unspecified: Secondary | ICD-10-CM | POA: Diagnosis present

## 2019-02-21 DIAGNOSIS — M545 Low back pain: Secondary | ICD-10-CM | POA: Diagnosis not present

## 2019-02-21 DIAGNOSIS — Z7901 Long term (current) use of anticoagulants: Secondary | ICD-10-CM | POA: Diagnosis not present

## 2019-02-21 DIAGNOSIS — Z8673 Personal history of transient ischemic attack (TIA), and cerebral infarction without residual deficits: Secondary | ICD-10-CM

## 2019-02-21 DIAGNOSIS — R001 Bradycardia, unspecified: Secondary | ICD-10-CM | POA: Diagnosis present

## 2019-02-21 DIAGNOSIS — S199XXA Unspecified injury of neck, initial encounter: Secondary | ICD-10-CM | POA: Diagnosis not present

## 2019-02-21 DIAGNOSIS — Z66 Do not resuscitate: Secondary | ICD-10-CM | POA: Diagnosis present

## 2019-02-21 DIAGNOSIS — Z8349 Family history of other endocrine, nutritional and metabolic diseases: Secondary | ICD-10-CM

## 2019-02-21 DIAGNOSIS — Z79899 Other long term (current) drug therapy: Secondary | ICD-10-CM

## 2019-02-21 DIAGNOSIS — D631 Anemia in chronic kidney disease: Secondary | ICD-10-CM | POA: Diagnosis present

## 2019-02-21 DIAGNOSIS — N1832 Chronic kidney disease, stage 3b: Secondary | ICD-10-CM | POA: Diagnosis present

## 2019-02-21 DIAGNOSIS — I480 Paroxysmal atrial fibrillation: Secondary | ICD-10-CM | POA: Diagnosis present

## 2019-02-21 DIAGNOSIS — Y92019 Unspecified place in single-family (private) house as the place of occurrence of the external cause: Secondary | ICD-10-CM

## 2019-02-21 DIAGNOSIS — S32511A Fracture of superior rim of right pubis, initial encounter for closed fracture: Secondary | ICD-10-CM | POA: Diagnosis not present

## 2019-02-21 DIAGNOSIS — S32592A Other specified fracture of left pubis, initial encounter for closed fracture: Secondary | ICD-10-CM | POA: Diagnosis present

## 2019-02-21 DIAGNOSIS — Z8249 Family history of ischemic heart disease and other diseases of the circulatory system: Secondary | ICD-10-CM

## 2019-02-21 DIAGNOSIS — Y9301 Activity, walking, marching and hiking: Secondary | ICD-10-CM | POA: Diagnosis present

## 2019-02-21 DIAGNOSIS — R52 Pain, unspecified: Secondary | ICD-10-CM | POA: Diagnosis not present

## 2019-02-21 DIAGNOSIS — Z1159 Encounter for screening for other viral diseases: Secondary | ICD-10-CM

## 2019-02-21 DIAGNOSIS — F03A0 Unspecified dementia, mild, without behavioral disturbance, psychotic disturbance, mood disturbance, and anxiety: Secondary | ICD-10-CM | POA: Diagnosis present

## 2019-02-21 DIAGNOSIS — E871 Hypo-osmolality and hyponatremia: Secondary | ICD-10-CM | POA: Diagnosis present

## 2019-02-21 DIAGNOSIS — S32591A Other specified fracture of right pubis, initial encounter for closed fracture: Secondary | ICD-10-CM

## 2019-02-21 DIAGNOSIS — R32 Unspecified urinary incontinence: Secondary | ICD-10-CM | POA: Diagnosis present

## 2019-02-21 DIAGNOSIS — Z950 Presence of cardiac pacemaker: Secondary | ICD-10-CM

## 2019-02-21 DIAGNOSIS — F0390 Unspecified dementia without behavioral disturbance: Secondary | ICD-10-CM | POA: Diagnosis present

## 2019-02-21 DIAGNOSIS — S0990XA Unspecified injury of head, initial encounter: Secondary | ICD-10-CM | POA: Diagnosis not present

## 2019-02-21 DIAGNOSIS — D649 Anemia, unspecified: Secondary | ICD-10-CM | POA: Diagnosis present

## 2019-02-21 DIAGNOSIS — S299XXA Unspecified injury of thorax, initial encounter: Secondary | ICD-10-CM | POA: Diagnosis not present

## 2019-02-21 DIAGNOSIS — S3992XA Unspecified injury of lower back, initial encounter: Secondary | ICD-10-CM | POA: Diagnosis not present

## 2019-02-21 DIAGNOSIS — N183 Chronic kidney disease, stage 3 (moderate): Secondary | ICD-10-CM | POA: Diagnosis present

## 2019-02-21 DIAGNOSIS — K219 Gastro-esophageal reflux disease without esophagitis: Secondary | ICD-10-CM | POA: Diagnosis present

## 2019-02-21 DIAGNOSIS — S3993XA Unspecified injury of pelvis, initial encounter: Secondary | ICD-10-CM | POA: Diagnosis not present

## 2019-02-21 MED ORDER — FENTANYL CITRATE (PF) 100 MCG/2ML IJ SOLN
50.0000 ug | Freq: Once | INTRAMUSCULAR | Status: AC
  Administered 2019-02-22: 50 ug via INTRAVENOUS
  Filled 2019-02-21: qty 2

## 2019-02-21 NOTE — ED Triage Notes (Signed)
Pt. BIB EMS from home after fall while trying to reach for something. Per EMS no external rotation on scene. Pt. Fell on right side Denies hitting head/LOC. Pt states right arm sore and pain down right leg. Pt. Able to move leg with pain. Skin intact.

## 2019-02-21 NOTE — ED Provider Notes (Signed)
[facility] EMERGENCY DEPARTMENT Provider Note   CSN: [id number] Arrival date & time: 02/21/19  2206    History   Chief Complaint Chief Complaint  Patient presents with  . Fall    HPI Erica Carroll is a 85 y.o. female with a hx of a-fib (on coumadin), pacemaker, GERD, TIA presents to the Emergency Department after acute fall approx 1 hour PTA.  Pt reports her son was home with her but he did not witness the fall.  She states she thinks she lost her balance reaching for her coffee, but cannot remember the details of the fall.  She does not know if she was sitting or standing at the time.  Unsure of LOC.  Pt reports compliance with medications including metoprolol and warfarin.  Pt reports low back pain and "groin" pain worse with movement of her legs.  No treatment PTA.  Pt denies numbness or weakness in her legs. Pt denies loss of bowel or bladder control in association with the fall.  She does report baseline urinary incontinence.    Additional hx provided by Pt's daughter via telephone.  Daughter reports patient is ambulatory with a cane, but without additional assistance. She reports Pt lives with her brother, is compliant with medications and normally oriented to person, place and time.  Daughter reports some increasing situational confusion over the last 6 mos, worse at night, but no diagnosis of dementia.          The history is provided by the patient, medical records and a relative. History limited by: mild dementia. No language interpreter was used.    Past Medical History:  Diagnosis Date  . A-fib ([place])    a. Dx 06/2013;  b. 06/2013 Echo:  EF 55-60%, no reg wma, mild AI, mod dil LA.  Marland Kitchen Allergy   . GERD (gastroesophageal reflux disease)   . Hyperlipidemia   . RA (rheumatoid arthritis) ([place])    " in my hands "  . Sinus bradycardia 07/11/2013  . TIA (transient ischemic attack)    a. with fall 05/2013 -> neg head CT and MRI/MRA;  b. Plavix started.  .  Urinary incontinence   . Vitamin D deficiency     Patient Active Problem List   Diagnosis Date Noted  . Mild dementia ([place]) 10/02/2018  . Environmental allergies   . Tachy-brady syndrome ([place]) 02/12/2015  . CKD stage G3b/A1, GFR 30-44 and albumin creatinine ratio <30 mg/g ([facility]) 01/28/2015  . Essential hypertension 01/03/2014  . Other abnormal glucose 01/03/2014  . Medication management 10/31/2013  . Hyperlipidemia   . Vitamin D deficiency   . Long term current use of anticoagulant therapy 07/16/2013  . Sinus bradycardia 07/11/2013  . Paroxysmal atrial fibrillation ([place]) 07/10/2013  . GERD (gastroesophageal reflux disease) 07/10/2013  . History of TIA (transient ischemic attack) 06/25/2013    Past Surgical History:  Procedure Laterality Date  . APPENDECTOMY  1936  . EP IMPLANTABLE DEVICE N/A 02/12/2015   Procedure: Pacemaker Implant;  Surgeon: Deboraha Sprang, MD;  Location: [facility];  Service: Cardiovascular;  Laterality: N/A;  . EYE SURGERY Right 1994   CE/IOL  . EYE SURGERY Left 1998   CE/IOL     OB History   No obstetric history on file.      Home Medications    Prior to Admission medications   Medication Sig Start Date End Date Taking? Authorizing Provider  acetaminophen (TYLENOL) 325 MG tablet Take 325 mg by mouth  every 6 (six) hours as needed for mild pain.     [provider]  b complex vitamins tablet Take 1 tablet by mouth daily.    [provider]  Cholecalciferol (VITAMIN D PO) Take 3,000 Units by mouth daily.     [provider]  diltiazem (CARDIZEM) 30 MG tablet Take 1 tablet (30 mg total) by mouth daily as needed. For Afib episodes 09/01/16   Deboraha Sprang, MD  GLUCOSAMINE-CHONDROITIN PO Take 1 tablet by mouth daily.    [provider]  MAGNESIUM OXIDE, ANTACID, PO Take 1 tablet by mouth daily.    [provider]  metoprolol tartrate (LOPRESSOR) 25 MG tablet Take 3 tablets (75 mg) by mouth twice  daily. Please schedule an appt for further refills 01/11/19   Deboraha Sprang, MD  OVER THE COUNTER MEDICATION Take 1 tablet by mouth daily. Preserve vision vitamin    [provider]  polyethylene glycol (MIRALAX / GLYCOLAX) packet Take 17 g by mouth daily as needed (constipation).    [provider]  warfarin (COUMADIN) 5 MG tablet Take 1/2 to 1 tablet daily as directed by coumadin clinic. 02/06/19   Deboraha Sprang, MD    Family History Family History  Problem Relation Age of Onset  . Heart disease Mother   . Heart disease Sister   . Heart disease Brother   . Heart disease Brother   . CVA Sister   . Thyroid disease Sister     Social History Social History   Tobacco Use  . Smoking status: Never Smoker  . Smokeless tobacco: Never Used  Substance Use Topics  . Alcohol use: Yes    Alcohol/week: 0.0 standard drinks    Comment: ocassional-rarely  . Drug use: No     Allergies   Other   Review of Systems Review of Systems  Constitutional: Negative for appetite change, diaphoresis, fatigue, fever and unexpected weight change.  HENT: Negative for mouth sores.   Eyes: Negative for visual disturbance.  Respiratory: Negative for cough, chest tightness, shortness of breath and wheezing.   Cardiovascular: Negative for chest pain.  Gastrointestinal: Negative for abdominal pain, constipation, diarrhea, nausea and vomiting.  Endocrine: Negative for polydipsia, polyphagia and polyuria.  Genitourinary: Positive for pelvic pain. Negative for dysuria, frequency, hematuria and urgency.  Musculoskeletal: Positive for back pain and gait problem. Negative for neck stiffness.  Skin: Negative for rash.  Allergic/Immunologic: Negative for immunocompromised state.  Neurological: Negative for syncope, light-headedness and headaches.  Hematological: Does not bruise/bleed easily.  Psychiatric/Behavioral: Negative for sleep disturbance. The patient is not nervous/anxious.       Physical Exam Updated Vital Signs BP (!) 157/67 (BP Location: Right Arm)   Pulse 70   Temp 98.1 F (36.7 C) (Oral)   Resp 18   SpO2 100%   Physical Exam Vitals signs and nursing note reviewed.  Constitutional:      General: She is not in acute distress.    Appearance: She is not diaphoretic.  HENT:     Head: Normocephalic and atraumatic.     Comments: No ecchymosis or contusion noted Eyes:     General: No scleral icterus.    Conjunctiva/sclera: Conjunctivae normal.     Pupils: Pupils are equal, round, and reactive to light.  Neck:     Musculoskeletal: Normal range of motion. No spinous process tenderness or muscular tenderness.  Cardiovascular:     Rate and Rhythm: Normal rate and regular rhythm.  Pulses: Normal pulses.          Radial pulses are 2+ on the right side and 2+ on the left side.  Pulmonary:     Effort: No tachypnea, accessory muscle usage, prolonged expiration, respiratory distress or retractions.     Breath sounds: No stridor.     Comments: Equal chest rise. No increased work of breathing. Chest:     Chest wall: No deformity, swelling or tenderness.     Comments: No flail segment, contusion, abrasion or tenderness to palpation Abdominal:     General: There is no distension.     Palpations: Abdomen is soft.     Tenderness: There is no abdominal tenderness. There is no guarding or rebound.  Musculoskeletal:     Right hip: She exhibits tenderness. She exhibits no swelling, no crepitus and no deformity.     Left hip: She exhibits tenderness. She exhibits no swelling, no crepitus and no deformity.     Comments: Moves upper extremities equally and without difficulty or pain.  Able to bend bilateral knees and hips to approx 130 degrees before significant pain in the pelvis, groin and low back.  Full ROM of bilateral ankles and toes.  Skin:    General: Skin is warm and dry.     Capillary Refill: Capillary refill takes less than 2 seconds.  Neurological:      Mental Status: She is alert.     GCS: GCS eye subscore is 4. GCS verbal subscore is 5. GCS motor subscore is 6.     Comments: Speech is clear and goal oriented. Pt answers orientation questions correctly but is some confused about the events surrounding the fall.  Strength 5/5 in the BUE including strong grip strength.  BLE 5/5 with dorsiflexion and plantarflexion; no testing of strength at the knees or hips due to pain.  Sensation grossly intact to normal touch in the BLE.    Psychiatric:        Mood and Affect: Mood normal.      ED Treatments / Results   EKG EKG Interpretation  Date/Time:  Wednesday Feb 21 2019 23:47:31 EDT Ventricular Rate:  73 PR Interval:    QRS Duration: 137 QT Interval:  417 QTC Calculation: 460 R Axis:   -59 Text Interpretation:  Sinus rhythm Short PR interval RBBB and LAFB Abnrm T, consider ischemia, anterolateral lds No significant change since last tracing Confirmed by Orpah Greek [phone]) on 02/21/2019 11:52:00 PM     Radiology Dg Lumbar Spine Complete  Result Date: 02/21/2019 CLINICAL DATA:  Status post fall, back pain EXAM: LUMBAR SPINE - COMPLETE 4+ VIEW COMPARISON:  None. FINDINGS: There are 5 nonrib bearing lumbar-type vertebral bodies. There is generalized osteopenia. The vertebral body heights are maintained. There is grade 1 anterolisthesis of L4 on L5 and L5 on S1. There is 3 mm retrolisthesis of L3 on L4. There is no spondylolysis. There is no acute fracture. There is degenerative disc disease with disc height loss at L3-4, L4-5 and L5-S1. There is degenerative disc disease with disc height loss at L1-2. The SI joints are unremarkable. IMPRESSION: No acute osseous injury of the lumbar spine. Electronically Signed   By: Kathreen Devoid   On: 02/21/2019 23:36   Ct Head Wo Contrast  Result Date: 02/21/2019 CLINICAL DATA:  Recent fall while on blood thinners EXAM: CT HEAD WITHOUT CONTRAST CT CERVICAL SPINE WITHOUT CONTRAST TECHNIQUE:  Multidetector CT imaging of the head and cervical spine was performed following  the standard protocol without intravenous contrast. Multiplanar CT image reconstructions of the cervical spine were also generated. COMPARISON:  01/28/2015 FINDINGS: CT HEAD FINDINGS Brain: No evidence of acute infarction, hemorrhage, hydrocephalus, extra-axial collection or mass lesion/mass effect. Mild atrophic changes are noted. Vascular: No hyperdense vessel or unexpected calcification. Skull: Normal. Negative for fracture or focal lesion. Sinuses/Orbits: No acute finding. Other: None. CT CERVICAL SPINE FINDINGS Alignment: Within normal limits. Skull base and vertebrae: 7 cervical segments are well visualized. Vertebral body height is well maintained. Facet hypertrophic changes are noted at multiple levels. No acute fracture or acute facet abnormality is noted. Soft tissues and spinal canal: Surrounding soft tissue structures are within normal limits. Upper chest: Visualized lung apices are unremarkable. Other: None IMPRESSION: CT of the head: Atrophic changes without acute abnormality. CT of cervical spine: Multilevel degenerative change without acute abnormality. Electronically Signed   By: Inez Catalina M.D.   On: 02/21/2019 23:11   Ct Cervical Spine Wo Contrast  Result Date: 02/21/2019 CLINICAL DATA:  Recent fall while on blood thinners EXAM: CT HEAD WITHOUT CONTRAST CT CERVICAL SPINE WITHOUT CONTRAST TECHNIQUE: Multidetector CT imaging of the head and cervical spine was performed following the standard protocol without intravenous contrast. Multiplanar CT image reconstructions of the cervical spine were also generated. COMPARISON:  01/28/2015 FINDINGS: CT HEAD FINDINGS Brain: No evidence of acute infarction, hemorrhage, hydrocephalus, extra-axial collection or mass lesion/mass effect. Mild atrophic changes are noted. Vascular: No hyperdense vessel or unexpected calcification. Skull: Normal. Negative for fracture or focal  lesion. Sinuses/Orbits: No acute finding. Other: None. CT CERVICAL SPINE FINDINGS Alignment: Within normal limits. Skull base and vertebrae: 7 cervical segments are well visualized. Vertebral body height is well maintained. Facet hypertrophic changes are noted at multiple levels. No acute fracture or acute facet abnormality is noted. Soft tissues and spinal canal: Surrounding soft tissue structures are within normal limits. Upper chest: Visualized lung apices are unremarkable. Other: None IMPRESSION: CT of the head: Atrophic changes without acute abnormality. CT of cervical spine: Multilevel degenerative change without acute abnormality. Electronically Signed   By: Inez Catalina M.D.   On: 02/21/2019 23:11   Dg Pelvis Portable  Result Date: 02/21/2019 CLINICAL DATA:  Fall, question hip fracture EXAM: PORTABLE PELVIS 1-2 VIEWS COMPARISON:  None. FINDINGS: Coarse calcifications in the pelvis. Pubic symphysis is intact. Both femoral heads project in joint. There are mild degenerative changes. Possible fracture lucencies at the right superior and inferior pubic rami. IMPRESSION: Possible acute fractures involving the right superior and inferior pubic rami. Electronically Signed   By: Donavan Foil M.D.   On: 02/21/2019 23:01   Dg Chest Port 1 View  Result Date: 02/21/2019 CLINICAL DATA:  Fall EXAM: PORTABLE CHEST 1 VIEW COMPARISON:  04/25/2015 FINDINGS: Left-sided pacing device with leads over right atrium and right ventricle. Mild cardiomegaly with aortic atherosclerosis. No acute opacity, pleural effusion or pneumothorax. Old right-sided rib fractures. IMPRESSION: No active disease.  Mild cardiomegaly Electronically Signed   By: Donavan Foil M.D.   On: 02/21/2019 22:59    Procedures Procedures (including critical care time)  Medications Ordered in ED Medications  fentaNYL (SUBLIMAZE) injection 50 mcg (has no administration in time range)     Initial Impression / Assessment and Plan / ED Course  I  have reviewed the triage vital signs and the nursing notes.  Pertinent labs & imaging results that were available during my care of the patient were reviewed by me and considered in my medical decision making (see  chart for details).  Clinical Course as of Feb 22 52  Thu Feb 22, 2019  0002 Discussed with Dr. Hal Hope who will admit   [HM]    Clinical Course User Index [HM] Shaynah Hund, [place], [place] was evaluated in Emergency Department on 02/21/2019 for the symptoms described in the history of present illness. She was evaluated in the context of the global COVID-19 pandemic, which necessitated consideration that the patient might be at risk for infection with the SARS-CoV-2 virus that causes COVID-19. Institutional protocols and algorithms that pertain to the evaluation of patients at risk for COVID-19 are in a state of rapid change based on information released by regulatory bodies including the CDC and federal and state organizations. These policies and algorithms were followed during the patient's care in the ED.   Pt presents with fall, suspect mechanical but will w/u for possible syncope as pt cannot confirm details of fall.  No CP, SOB or lightheadedness prior to the fall or at this time.  Pain in groin and low back with movement of bilateral hips, suspect pelvic fracture.  Pt also c/o low back pain, unable to assess on initial exam due to pain with movement and no assistance in the room.  Will assess for possible lumbar fracture.  Pain control given. No COVID symptoms, hx of fever, cough or SOB.    11:42 PM CT head without acute abnormality or evidence of ICH.  L spine without fracture.  Pelvis with R pubic rami fractures as suspected.  Pt unable to ambulate due to pain.  Will admit for pain control and physical therapy consult.   The patient was discussed with and seen by Dr. Betsey Holiday who agrees with the treatment plan.  12:05 AM Labs still pending. ECG  unchanged from previous.  Pt continues to deny cardiac symptoms. Discussed with Dr. Hal Hope who will admit and follow-up on labs and pacemaker interrogation.     Final Clinical Impressions(s) / ED Diagnoses   Final diagnoses:  Fall, initial encounter  Closed fracture of multiple pubic rami, right, initial encounter [facility])  Current use of long term anticoagulation    ED Discharge Orders    None       Loni Muse Gwenlyn Perking 02/22/19 0055    Orpah Greek, MD 02/22/19 [phone]

## 2019-02-21 NOTE — Telephone Encounter (Signed)

## 2019-02-22 ENCOUNTER — Observation Stay (HOSPITAL_COMMUNITY): Payer: Medicare HMO

## 2019-02-22 ENCOUNTER — Encounter (HOSPITAL_COMMUNITY): Payer: Self-pay | Admitting: Internal Medicine

## 2019-02-22 DIAGNOSIS — S32592D Other specified fracture of left pubis, subsequent encounter for fracture with routine healing: Secondary | ICD-10-CM

## 2019-02-22 DIAGNOSIS — I480 Paroxysmal atrial fibrillation: Secondary | ICD-10-CM | POA: Diagnosis not present

## 2019-02-22 DIAGNOSIS — S32591D Other specified fracture of right pubis, subsequent encounter for fracture with routine healing: Secondary | ICD-10-CM | POA: Diagnosis not present

## 2019-02-22 DIAGNOSIS — Z7901 Long term (current) use of anticoagulants: Secondary | ICD-10-CM | POA: Diagnosis not present

## 2019-02-22 DIAGNOSIS — Z8673 Personal history of transient ischemic attack (TIA), and cerebral infarction without residual deficits: Secondary | ICD-10-CM | POA: Diagnosis not present

## 2019-02-22 DIAGNOSIS — S329XXA Fracture of unspecified parts of lumbosacral spine and pelvis, initial encounter for closed fracture: Secondary | ICD-10-CM | POA: Diagnosis not present

## 2019-02-22 DIAGNOSIS — Z66 Do not resuscitate: Secondary | ICD-10-CM | POA: Diagnosis present

## 2019-02-22 DIAGNOSIS — F0390 Unspecified dementia without behavioral disturbance: Secondary | ICD-10-CM

## 2019-02-22 DIAGNOSIS — N183 Chronic kidney disease, stage 3 (moderate): Secondary | ICD-10-CM

## 2019-02-22 DIAGNOSIS — Z950 Presence of cardiac pacemaker: Secondary | ICD-10-CM | POA: Diagnosis not present

## 2019-02-22 DIAGNOSIS — Y92019 Unspecified place in single-family (private) house as the place of occurrence of the external cause: Secondary | ICD-10-CM | POA: Diagnosis not present

## 2019-02-22 DIAGNOSIS — R001 Bradycardia, unspecified: Secondary | ICD-10-CM | POA: Diagnosis not present

## 2019-02-22 DIAGNOSIS — Z79899 Other long term (current) drug therapy: Secondary | ICD-10-CM | POA: Diagnosis not present

## 2019-02-22 DIAGNOSIS — E559 Vitamin D deficiency, unspecified: Secondary | ICD-10-CM | POA: Diagnosis present

## 2019-02-22 DIAGNOSIS — S32591A Other specified fracture of right pubis, initial encounter for closed fracture: Secondary | ICD-10-CM | POA: Diagnosis not present

## 2019-02-22 DIAGNOSIS — R69 Illness, unspecified: Secondary | ICD-10-CM | POA: Diagnosis not present

## 2019-02-22 DIAGNOSIS — S32511A Fracture of superior rim of right pubis, initial encounter for closed fracture: Secondary | ICD-10-CM | POA: Diagnosis not present

## 2019-02-22 DIAGNOSIS — Y9301 Activity, walking, marching and hiking: Secondary | ICD-10-CM | POA: Diagnosis present

## 2019-02-22 DIAGNOSIS — S32509D Unspecified fracture of unspecified pubis, subsequent encounter for fracture with routine healing: Secondary | ICD-10-CM | POA: Diagnosis not present

## 2019-02-22 DIAGNOSIS — E871 Hypo-osmolality and hyponatremia: Secondary | ICD-10-CM | POA: Diagnosis not present

## 2019-02-22 DIAGNOSIS — Z8249 Family history of ischemic heart disease and other diseases of the circulatory system: Secondary | ICD-10-CM | POA: Diagnosis not present

## 2019-02-22 DIAGNOSIS — R32 Unspecified urinary incontinence: Secondary | ICD-10-CM | POA: Diagnosis present

## 2019-02-22 DIAGNOSIS — D649 Anemia, unspecified: Secondary | ICD-10-CM | POA: Diagnosis not present

## 2019-02-22 DIAGNOSIS — D631 Anemia in chronic kidney disease: Secondary | ICD-10-CM | POA: Diagnosis present

## 2019-02-22 DIAGNOSIS — Z1159 Encounter for screening for other viral diseases: Secondary | ICD-10-CM | POA: Diagnosis not present

## 2019-02-22 DIAGNOSIS — Z823 Family history of stroke: Secondary | ICD-10-CM | POA: Diagnosis not present

## 2019-02-22 DIAGNOSIS — I129 Hypertensive chronic kidney disease with stage 1 through stage 4 chronic kidney disease, or unspecified chronic kidney disease: Secondary | ICD-10-CM | POA: Diagnosis present

## 2019-02-22 DIAGNOSIS — K219 Gastro-esophageal reflux disease without esophagitis: Secondary | ICD-10-CM | POA: Diagnosis present

## 2019-02-22 DIAGNOSIS — W010XXA Fall on same level from slipping, tripping and stumbling without subsequent striking against object, initial encounter: Secondary | ICD-10-CM | POA: Diagnosis not present

## 2019-02-22 DIAGNOSIS — Z8349 Family history of other endocrine, nutritional and metabolic diseases: Secondary | ICD-10-CM | POA: Diagnosis not present

## 2019-02-22 HISTORY — DX: Fracture of unspecified parts of lumbosacral spine and pelvis, initial encounter for closed fracture: S32.9XXA

## 2019-02-22 LAB — CBC WITH DIFFERENTIAL/PLATELET
Abs Immature Granulocytes: 0.06 10*3/uL (ref 0.00–0.07)
Abs Immature Granulocytes: 0.05 10*3/uL (ref 0.00–0.07)
Basophils Absolute: 0 10*3/uL (ref 0.0–0.1)
Basophils Absolute: 0 10*3/uL (ref 0.0–0.1)
Basophils Relative: 0 %
Basophils Relative: 0 %
Eosinophils Absolute: 0.1 10*3/uL (ref 0.0–0.5)
Eosinophils Absolute: 0.1 10*3/uL (ref 0.0–0.5)
Eosinophils Relative: 1 %
Eosinophils Relative: 1 %
HCT: 34 % — ABNORMAL LOW (ref 36.0–46.0)
HCT: 29.9 % — ABNORMAL LOW (ref 36.0–46.0)
Hemoglobin: 11.6 g/dL — ABNORMAL LOW (ref 12.0–15.0)
Hemoglobin: 10.3 g/dL — ABNORMAL LOW (ref 12.0–15.0)
Immature Granulocytes: 1 %
Immature Granulocytes: 1 %
Lymphocytes Relative: 9 %
Lymphocytes Relative: 14 %
Lymphs Abs: 1.1 10*3/uL (ref 0.7–4.0)
Lymphs Abs: 1.4 10*3/uL (ref 0.7–4.0)
MCH: 31.1 pg (ref 26.0–34.0)
MCH: 30.9 pg (ref 26.0–34.0)
MCHC: 34.1 g/dL (ref 30.0–36.0)
MCHC: 34.4 g/dL (ref 30.0–36.0)
MCV: 91.2 fL (ref 80.0–100.0)
MCV: 89.8 fL (ref 80.0–100.0)
Monocytes Absolute: 0.8 10*3/uL (ref 0.1–1.0)
Monocytes Absolute: 0.7 10*3/uL (ref 0.1–1.0)
Monocytes Relative: 6 %
Monocytes Relative: 7 %
Neutro Abs: 10.5 10*3/uL — ABNORMAL HIGH (ref 1.7–7.7)
Neutro Abs: 7.7 10*3/uL (ref 1.7–7.7)
Neutrophils Relative %: 83 %
Neutrophils Relative %: 77 %
Platelets: UNDETERMINED 10*3/uL (ref 150–400)
Platelets: 131 10*3/uL — ABNORMAL LOW (ref 150–400)
RBC: 3.73 MIL/uL — ABNORMAL LOW (ref 3.87–5.11)
RBC: 3.33 MIL/uL — ABNORMAL LOW (ref 3.87–5.11)
RDW: 12.4 % (ref 11.5–15.5)
RDW: 12.4 % (ref 11.5–15.5)
WBC: 12.5 10*3/uL — ABNORMAL HIGH (ref 4.0–10.5)
WBC: 9.9 10*3/uL (ref 4.0–10.5)
nRBC: 0 % (ref 0.0–0.2)
nRBC: 0 % (ref 0.0–0.2)

## 2019-02-22 LAB — BASIC METABOLIC PANEL
Anion gap: 8 (ref 5–15)
BUN: 11 mg/dL (ref 8–23)
CO2: 23 mmol/L (ref 22–32)
Calcium: 8.6 mg/dL — ABNORMAL LOW (ref 8.9–10.3)
Chloride: 101 mmol/L (ref 98–111)
Creatinine, Ser: 1.21 mg/dL — ABNORMAL HIGH (ref 0.44–1.00)
GFR calc Af Amer: 44 mL/min — ABNORMAL LOW (ref >60)
GFR calc non Af Amer: 38 mL/min — ABNORMAL LOW (ref >60)
Glucose, Bld: 127 mg/dL — ABNORMAL HIGH (ref 70–99)
Potassium: 4.3 mmol/L (ref 3.5–5.1)
Sodium: 132 mmol/L — ABNORMAL LOW (ref 135–145)

## 2019-02-22 LAB — COMPREHENSIVE METABOLIC PANEL
ALT: 12 U/L (ref 0–44)
AST: 29 U/L (ref 15–41)
Albumin: 3.8 g/dL (ref 3.5–5.0)
Alkaline Phosphatase: 55 U/L (ref 38–126)
Anion gap: 10 (ref 5–15)
BUN: 10 mg/dL (ref 8–23)
CO2: 22 mmol/L (ref 22–32)
Calcium: 8.7 mg/dL — ABNORMAL LOW (ref 8.9–10.3)
Chloride: 100 mmol/L (ref 98–111)
Creatinine, Ser: 1.3 mg/dL — ABNORMAL HIGH (ref 0.44–1.00)
GFR calc Af Amer: 41 mL/min — ABNORMAL LOW (ref >60)
GFR calc non Af Amer: 35 mL/min — ABNORMAL LOW (ref >60)
Glucose, Bld: 123 mg/dL — ABNORMAL HIGH (ref 70–99)
Potassium: 4.5 mmol/L (ref 3.5–5.1)
Sodium: 132 mmol/L — ABNORMAL LOW (ref 135–145)
Total Bilirubin: 0.9 mg/dL (ref 0.3–1.2)
Total Protein: 6.9 g/dL (ref 6.5–8.1)

## 2019-02-22 LAB — PROTIME-INR
INR: 2.1 — ABNORMAL HIGH (ref 0.8–1.2)
Prothrombin Time: 23.7 seconds — ABNORMAL HIGH (ref 11.4–15.2)

## 2019-02-22 LAB — CBC
HCT: 29.4 % — ABNORMAL LOW (ref 36.0–46.0)
Hemoglobin: 10.2 g/dL — ABNORMAL LOW (ref 12.0–15.0)
MCH: 30.9 pg (ref 26.0–34.0)
MCHC: 34.7 g/dL (ref 30.0–36.0)
MCV: 89.1 fL (ref 80.0–100.0)
Platelets: 140 10*3/uL — ABNORMAL LOW (ref 150–400)
RBC: 3.3 MIL/uL — ABNORMAL LOW (ref 3.87–5.11)
RDW: 12.3 % (ref 11.5–15.5)
WBC: 9.8 10*3/uL (ref 4.0–10.5)
nRBC: 0 % (ref 0.0–0.2)

## 2019-02-22 LAB — SARS CORONAVIRUS 2 BY RT PCR (HOSPITAL ORDER, PERFORMED IN ~~LOC~~ HOSPITAL LAB): SARS Coronavirus 2: NEGATIVE

## 2019-02-22 MED ORDER — ONDANSETRON HCL 4 MG PO TABS: 4.0000 mg | ORAL_TABLET | Freq: Four times a day (QID) | ORAL | Status: DC | PRN

## 2019-02-22 MED ORDER — ONDANSETRON HCL 4 MG/2ML IJ SOLN: 4.0000 mg | Freq: Four times a day (QID) | INTRAMUSCULAR | Status: DC | PRN

## 2019-02-22 MED ORDER — FENTANYL CITRATE (PF) 100 MCG/2ML IJ SOLN: 25.0000 ug | INTRAMUSCULAR | Status: DC | PRN

## 2019-02-22 MED ORDER — WARFARIN SODIUM 2.5 MG PO TABS
2.5000 mg | ORAL_TABLET | Freq: Once | ORAL | Status: AC
  Administered 2019-02-22: 2.5 mg via ORAL
  Filled 2019-02-22: qty 1

## 2019-02-22 MED ORDER — METOPROLOL TARTRATE 50 MG PO TABS
75.0000 mg | ORAL_TABLET | Freq: Two times a day (BID) | ORAL | Status: DC
  Administered 2019-02-22 – 2019-02-28 (×13): 75 mg via ORAL
  Filled 2019-02-22 (×13): qty 1

## 2019-02-22 MED ORDER — ACETAMINOPHEN 325 MG PO TABS
650.0000 mg | ORAL_TABLET | Freq: Four times a day (QID) | ORAL | Status: DC | PRN
  Administered 2019-02-22 – 2019-02-28 (×8): 650 mg via ORAL
  Filled 2019-02-22 (×9): qty 2

## 2019-02-22 MED ORDER — WARFARIN - PHARMACIST DOSING INPATIENT
Freq: Every day | Status: DC
  Administered 2019-02-24 – 2019-02-27 (×3)

## 2019-02-22 MED ORDER — ACETAMINOPHEN 650 MG RE SUPP: 650.0000 mg | Freq: Four times a day (QID) | RECTAL | Status: DC | PRN

## 2019-02-22 MED ORDER — FENTANYL CITRATE (PF) 100 MCG/2ML IJ SOLN
12.5000 ug | INTRAMUSCULAR | Status: DC | PRN
  Filled 2019-02-22: qty 2

## 2019-02-22 NOTE — Progress Notes (Signed)
PROGRESS NOTE    Erica Carroll  XBL:[id number] DOB: 15-Nov-1921 DOA: 02/21/2019 PCP: Unk Pinto, MD   Brief Narrative:  Erica Carroll is a 85 y.o. female with history of atrial fibrillation on metoprolol and Coumadin, sinus bradycardia status post pacemaker placement, chronic kidney disease stage III, chronic anemia mild dementia had a fall at home while walking.  As per the daughter patient likely tripped on something in the hall and fell.  Denies losing consciousness denies any chest pain nausea vomiting or diarrhea.  Has been having pain in the right hip pelvic area.  ED Course: X-rays in the ER shows a right possible acute fractures of the right superior and inferior pubic rami.  CT head and C-spine were unremarkable.  EKG shows normal sinus rhythm with T wave changes which are comparable to the old EKG.  Labs show anemia and mild hyponatremia and creatinine elevated at baseline.  Patient has significant pain for which patient was given fentanyl and admitted for further observation.    Assessment & Plan:   Principal Problem:   Pelvic fracture (HCC) Active Problems:   Paroxysmal atrial fibrillation (HCC)   CKD stage G3b/A1, GFR 30-44 and albumin creatinine ratio <30 mg/g (HCC)   Mild dementia (HCC)   Bilateral fracture of pubic rami (HCC)   1. Acute fracture involving the right superior and inferior pubic rami status post mechanical fall -agree, keep patient on fentanyl pain relief medication physical therapy consult showed patient was unable to ambulate, needs 2 person assist to stand and pivot.  Patient unsafe to discharge home high fall risk with worsening injury at rest.    Seen by orthopedics nonoperative at this point.  Also seen by [facility] with recommendations for intensive posthospitalization for rehab in order to achieve prior level of functioning to include ADLs at home 2. A. fib on metoprolol and Coumadin.    Continue nodal blocking agents, Coumadin to be dosed per  pharmacy. 3. Chronic kidney disease stage III creatinine appears to be at baseline.  Will monitor electrolytes 4. Chronic anemia likely from renal disease appears to be at baseline.  Follow CBC. 5. Mild hyponatremia follow metabolic panel.. 6. Status post pacemaker placement for sinus bradycardia.  Interrogation was done and results are pending-not yet in chart, although no evidence of arrhythmias noted today   DVT prophylaxis: [id number]  Code Status: DNR    Code Status Orders  (From admission, onward)         Start     Ordered   02/22/19 0011  Do not attempt resuscitation (DNR)  Continuous    Question Answer Comment  In the event of cardiac or respiratory ARREST Do not call a code blue   In the event of cardiac or respiratory ARREST Do not perform Intubation, CPR, defibrillation or ACLS   In the event of cardiac or respiratory ARREST Use medication by any route, position, wound care, and other measures to relive pain and suffering. May use oxygen, suction and manual treatment of airway obstruction as needed for comfort.      02/22/19 0011        Code Status History    Date Active Date Inactive Code Status Order ID Comments User Context   02/22/2019 0007 02/22/2019 0011 DNR [id number]  Rise Patience, MD ED   02/12/2015 1702 02/13/2015 1846 Full Code [id number]  Deboraha Sprang, MD Inpatient   02/12/2015 1343 02/12/2015 1702 Full Code [id number]  Melina Schools, NP Inpatient   01/28/2015  1801 01/29/2015 1817 Full Code [id number]  Sueanne Margarita, MD Inpatient   07/10/2013 0945 07/14/2013 1420 Full Code [id number]  Oswald Hillock, MD Inpatient   06/25/2013 2320 06/27/2013 2055 Full Code [id number]  Orvan Falconer, MD Inpatient     Family Communication: Daughter at bedside, explained that facility permitting her to visit mother to assist with behavioral intervention in the setting of dementia Disposition Plan:   Patient was converted to inpatient because of severity illness patient is  unable to ambulate safely.  She requires 2 person assist for stand and pivot only, she is unable perform any additional physical therapy activities.  Because of this patient high risk of falls with additional or worsening injuries. Consults called: Rehab Admission status: Inpatient   Consultants:   As above  Procedures:  Dg Lumbar Spine Complete  Result Date: 02/21/2019 CLINICAL DATA:  Status post fall, back pain EXAM: LUMBAR SPINE - COMPLETE 4+ VIEW COMPARISON:  None. FINDINGS: There are 5 nonrib bearing lumbar-type vertebral bodies. There is generalized osteopenia. The vertebral body heights are maintained. There is grade 1 anterolisthesis of L4 on L5 and L5 on S1. There is 3 mm retrolisthesis of L3 on L4. There is no spondylolysis. There is no acute fracture. There is degenerative disc disease with disc height loss at L3-4, L4-5 and L5-S1. There is degenerative disc disease with disc height loss at L1-2. The SI joints are unremarkable. IMPRESSION: No acute osseous injury of the lumbar spine. Electronically Signed   By: Kathreen Devoid   On: 02/21/2019 23:36   Ct Head Wo Contrast  Result Date: 02/21/2019 CLINICAL DATA:  Recent fall while on blood thinners EXAM: CT HEAD WITHOUT CONTRAST CT CERVICAL SPINE WITHOUT CONTRAST TECHNIQUE: Multidetector CT imaging of the head and cervical spine was performed following the standard protocol without intravenous contrast. Multiplanar CT image reconstructions of the cervical spine were also generated. COMPARISON:  01/28/2015 FINDINGS: CT HEAD FINDINGS Brain: No evidence of acute infarction, hemorrhage, hydrocephalus, extra-axial collection or mass lesion/mass effect. Mild atrophic changes are noted. Vascular: No hyperdense vessel or unexpected calcification. Skull: Normal. Negative for fracture or focal lesion. Sinuses/Orbits: No acute finding. Other: None. CT CERVICAL SPINE FINDINGS Alignment: Within normal limits. Skull base and vertebrae: 7 cervical segments  are well visualized. Vertebral body height is well maintained. Facet hypertrophic changes are noted at multiple levels. No acute fracture or acute facet abnormality is noted. Soft tissues and spinal canal: Surrounding soft tissue structures are within normal limits. Upper chest: Visualized lung apices are unremarkable. Other: None IMPRESSION: CT of the head: Atrophic changes without acute abnormality. CT of cervical spine: Multilevel degenerative change without acute abnormality. Electronically Signed   By: Inez Catalina M.D.   On: 02/21/2019 23:11   Ct Cervical Spine Wo Contrast  Result Date: 02/21/2019 CLINICAL DATA:  Recent fall while on blood thinners EXAM: CT HEAD WITHOUT CONTRAST CT CERVICAL SPINE WITHOUT CONTRAST TECHNIQUE: Multidetector CT imaging of the head and cervical spine was performed following the standard protocol without intravenous contrast. Multiplanar CT image reconstructions of the cervical spine were also generated. COMPARISON:  01/28/2015 FINDINGS: CT HEAD FINDINGS Brain: No evidence of acute infarction, hemorrhage, hydrocephalus, extra-axial collection or mass lesion/mass effect. Mild atrophic changes are noted. Vascular: No hyperdense vessel or unexpected calcification. Skull: Normal. Negative for fracture or focal lesion. Sinuses/Orbits: No acute finding. Other: None. CT CERVICAL SPINE FINDINGS Alignment: Within normal limits. Skull base and vertebrae: 7 cervical segments are well visualized. Vertebral body height is well  maintained. Facet hypertrophic changes are noted at multiple levels. No acute fracture or acute facet abnormality is noted. Soft tissues and spinal canal: Surrounding soft tissue structures are within normal limits. Upper chest: Visualized lung apices are unremarkable. Other: None IMPRESSION: CT of the head: Atrophic changes without acute abnormality. CT of cervical spine: Multilevel degenerative change without acute abnormality. Electronically Signed   By: Inez Catalina M.D.   On: 02/21/2019 23:11   Dg Pelvis Portable  Result Date: 02/21/2019 CLINICAL DATA:  Fall, question hip fracture EXAM: PORTABLE PELVIS 1-2 VIEWS COMPARISON:  None. FINDINGS: Coarse calcifications in the pelvis. Pubic symphysis is intact. Both femoral heads project in joint. There are mild degenerative changes. Possible fracture lucencies at the right superior and inferior pubic rami. IMPRESSION: Possible acute fractures involving the right superior and inferior pubic rami. Electronically Signed   By: Donavan Foil M.D.   On: 02/21/2019 23:01   Dg Pelvis Comp Min 3v  Result Date: 02/22/2019 CLINICAL DATA:  Pelvic fracture EXAM: JUDET PELVIS - 3+ VIEW COMPARISON:  the previous day's study FINDINGS: Minimally displaced fractures of the right superior ischial ramus and inferior ischiopubic ramus. No other pelvic ring fracture is identified. There is mild narrowing of articular cartilage in bilateral hips. No convincing femoral or acetabular fracture. No dislocation. Bilateral pelvic phleboliths. Mild spondylitic changes in the visualized lower lumbar spine. IMPRESSION: Minimally displaced right superior and inferior ischial ramus fractures. Electronically Signed   By: Lucrezia Europe M.D.   On: 02/22/2019 09:48   Dg Chest Port 1 View  Result Date: 02/21/2019 CLINICAL DATA:  Fall EXAM: PORTABLE CHEST 1 VIEW COMPARISON:  04/25/2015 FINDINGS: Left-sided pacing device with leads over right atrium and right ventricle. Mild cardiomegaly with aortic atherosclerosis. No acute opacity, pleural effusion or pneumothorax. Old right-sided rib fractures. IMPRESSION: No active disease.  Mild cardiomegaly Electronically Signed   By: Donavan Foil M.D.   On: 02/21/2019 22:59     Antimicrobials:   None   Subjective: No acute events overnight.  Patient's daughter at bedside to assist with behavioral decompensation in the setting of dementia.  Objective: Vitals:   02/22/19 0121 02/22/19 0637 02/22/19 0647  02/22/19 1225  BP: (!) 158/108 (!) 147/59  (!) 115/50  Pulse: 75 64  63  Resp: 18 20  18   Temp: 98 F (36.7 C) 97.7 F (36.5 C)  99.2 F (37.3 C)  TempSrc: Oral Oral  Oral  SpO2: 95% 96%  97%  Weight:   68 kg   Height:   5\' 4"  (1.626 m)     Intake/Output Summary (Last 24 hours) at 02/22/2019 1621 Last data filed at 02/22/2019 1221 Gross per 24 hour  Intake 720 ml  Output 450 ml  Net 270 ml   Filed Weights   02/22/19 0647  Weight: 68 kg    Examination:  General exam: Appears calm and comfortable although obvious cognitive deficits poor insight Respiratory system: Clear to auscultation. Respiratory effort normal. Cardiovascular system: S1 & S2 heard, RRR. No JVD, murmurs, rubs, gallops or clicks. No pedal edema. Gastrointestinal system: Abdomen is nondistended, soft and nontender. No organomegaly or masses felt. Normal bowel sounds heard. Central nervous system: Alert and oriented. No focal neurological deficits although limited exam secondary to pain with range of motion secondary to pubic rami fracture. Extremities: No contractures warm well perfused Skin: No rashes, lesions or ulcers Psychiatry: No acute decompensation, known cognitive deficits..     Data Reviewed: I have personally reviewed following labs  and imaging studies  CBC: Recent Labs  Lab 02/22/19 0008 02/22/19 0436 02/22/19 0500  WBC 12.5* 9.9   9.8 DUP SEE [id number]  NEUTROABS 10.5* 7.7 PENDING  HGB 11.6* 10.3*   10.2* DUP SEE [id number]  HCT 34.0* 29.9*   29.4* DUP SEE [id number]  MCV 91.2 89.8   89.1 DUP SEE [id number]  PLT PLATELET CLUMPS NOTED ON SMEAR, UNABLE TO ESTIMATE 131*   140* DUP SEE [id number]   Basic Metabolic Panel: Recent Labs  Lab 02/22/19 0008 02/22/19 0436  NA 132* 132*  K 4.5 4.3  CL 100 101  CO2 22 23  GLUCOSE 123* 127*  BUN 10 11  CREATININE 1.30* 1.21*  CALCIUM 8.7* 8.6*   GFR: Estimated Creatinine Clearance: 26.9 mL/min (A) (by C-G formula based on SCr of 1.21 mg/dL (H)). Liver Function  Tests: Recent Labs  Lab 02/22/19 0008  AST 29  ALT 12  ALKPHOS 55  BILITOT 0.9  PROT 6.9  ALBUMIN 3.8   No results for input(s): LIPASE, AMYLASE in the last 168 hours. No results for input(s): AMMONIA in the last 168 hours. Coagulation Profile: Recent Labs  Lab 02/22/19 0436  INR 2.1*   Cardiac Enzymes: No results for input(s): CKTOTAL, CKMB, CKMBINDEX, TROPONINI in the last 168 hours. BNP (last 3 results) No results for input(s): PROBNP in the last 8760 hours. HbA1C: No results for input(s): HGBA1C in the last 72 hours. CBG: No results for input(s): GLUCAP in the last 168 hours. Lipid Profile: No results for input(s): CHOL, HDL, LDLCALC, TRIG, CHOLHDL, LDLDIRECT in the last 72 hours. Thyroid Function Tests: No results for input(s): TSH, T4TOTAL, FREET4, T3FREE, THYROIDAB in the last 72 hours. Anemia Panel: No results for input(s): VITAMINB12, FOLATE, FERRITIN, TIBC, IRON, RETICCTPCT in the last 72 hours. Sepsis Labs: No results for input(s): PROCALCITON, LATICACIDVEN in the last 168 hours.  Recent Results (from the past 240 hour(s))  SARS Coronavirus 2 (CEPHEID - Performed in [facility] hospital lab), Hosp Order     Status: None   Collection Time: 02/22/19 12:14 AM  Result Value Ref Range Status   SARS Coronavirus 2 NEGATIVE NEGATIVE Final    Comment: (NOTE) If result is NEGATIVE SARS-CoV-2 target nucleic acids are NOT DETECTED. The SARS-CoV-2 RNA is generally detectable in upper and lower  respiratory specimens during the acute phase of infection. The lowest  concentration of SARS-CoV-2 viral copies this assay can detect is 250  copies / mL. A negative result does not preclude SARS-CoV-2 infection  and should not be used as the sole basis for treatment or other  patient management decisions.  A negative result may occur with  improper specimen collection / handling, submission of specimen other  than nasopharyngeal swab, presence of viral mutation(s) within the    areas targeted by this assay, and inadequate number of viral copies  (<250 copies / mL). A negative result must be combined with clinical  observations, patient history, and epidemiological information. If result is POSITIVE SARS-CoV-2 target nucleic acids are DETECTED. The SARS-CoV-2 RNA is generally detectable in upper and lower  respiratory specimens dur ing the acute phase of infection.  Positive  results are indicative of active infection with SARS-CoV-2.  Clinical  correlation with patient history and other diagnostic information is  necessary to determine patient infection status.  Positive results do  not rule out bacterial infection or co-infection with other viruses. If result is PRESUMPTIVE POSTIVE SARS-CoV-2 nucleic acids MAY BE PRESENT.   A presumptive positive  result was obtained on the submitted specimen  and confirmed on repeat testing.  While 2019 novel coronavirus  (SARS-CoV-2) nucleic acids may be present in the submitted sample  additional confirmatory testing may be necessary for epidemiological  and / or clinical management purposes  to differentiate between  SARS-CoV-2 and other Sarbecovirus currently known to infect humans.  If clinically indicated additional testing with an alternate test  methodology [phone]) is advised. The SARS-CoV-2 RNA is generally  detectable in upper and lower respiratory sp ecimens during the acute  phase of infection. The expected result is Negative. Fact Sheet for Patients:  StrictlyIdeas.no Fact Sheet for Healthcare Providers: BankingDealers.co.za This test is not yet approved or cleared by the [place] FDA and has been authorized for detection and/or diagnosis of SARS-CoV-2 by FDA under an Emergency Use Authorization (EUA).  This EUA will remain in effect (meaning this test can be used) for the duration of the COVID-19 declaration under Section 564(b)(1) of the Act, 21  U.S.C. section 360bbb-3(b)(1), unless the authorization is terminated or revoked sooner. Performed at [facility], [place] [address], [place], [place] [address]          Radiology Studies: Dg Lumbar Spine Complete  Result Date: 02/21/2019 CLINICAL DATA:  Status post fall, back pain EXAM: LUMBAR SPINE - COMPLETE 4+ VIEW COMPARISON:  None. FINDINGS: There are 5 nonrib bearing lumbar-type vertebral bodies. There is generalized osteopenia. The vertebral body heights are maintained. There is grade 1 anterolisthesis of L4 on L5 and L5 on S1. There is 3 mm retrolisthesis of L3 on L4. There is no spondylolysis. There is no acute fracture. There is degenerative disc disease with disc height loss at L3-4, L4-5 and L5-S1. There is degenerative disc disease with disc height loss at L1-2. The SI joints are unremarkable. IMPRESSION: No acute osseous injury of the lumbar spine. Electronically Signed   By: Kathreen Devoid   On: 02/21/2019 23:36   Ct Head Wo Contrast  Result Date: 02/21/2019 CLINICAL DATA:  Recent fall while on blood thinners EXAM: CT HEAD WITHOUT CONTRAST CT CERVICAL SPINE WITHOUT CONTRAST TECHNIQUE: Multidetector CT imaging of the head and cervical spine was performed following the standard protocol without intravenous contrast. Multiplanar CT image reconstructions of the cervical spine were also generated. COMPARISON:  01/28/2015 FINDINGS: CT HEAD FINDINGS Brain: No evidence of acute infarction, hemorrhage, hydrocephalus, extra-axial collection or mass lesion/mass effect. Mild atrophic changes are noted. Vascular: No hyperdense vessel or unexpected calcification. Skull: Normal. Negative for fracture or focal lesion. Sinuses/Orbits: No acute finding. Other: None. CT CERVICAL SPINE FINDINGS Alignment: Within normal limits. Skull base and vertebrae: 7 cervical segments are well visualized. Vertebral body height is well maintained. Facet hypertrophic changes are noted at multiple levels. No acute  fracture or acute facet abnormality is noted. Soft tissues and spinal canal: Surrounding soft tissue structures are within normal limits. Upper chest: Visualized lung apices are unremarkable. Other: None IMPRESSION: CT of the head: Atrophic changes without acute abnormality. CT of cervical spine: Multilevel degenerative change without acute abnormality. Electronically Signed   By: Inez Catalina M.D.   On: 02/21/2019 23:11   Ct Cervical Spine Wo Contrast  Result Date: 02/21/2019 CLINICAL DATA:  Recent fall while on blood thinners EXAM: CT HEAD WITHOUT CONTRAST CT CERVICAL SPINE WITHOUT CONTRAST TECHNIQUE: Multidetector CT imaging of the head and cervical spine was performed following the standard protocol without intravenous contrast. Multiplanar CT image reconstructions of the cervical spine were also generated. COMPARISON:  01/28/2015 FINDINGS: CT HEAD FINDINGS Brain: No evidence of acute infarction, hemorrhage, hydrocephalus, extra-axial collection or mass lesion/mass effect. Mild atrophic changes are noted. Vascular: No hyperdense vessel or unexpected calcification. Skull: Normal. Negative for fracture or focal lesion. Sinuses/Orbits: No acute finding. Other: None. CT CERVICAL SPINE FINDINGS Alignment: Within normal limits. Skull base and vertebrae: 7 cervical segments are well visualized. Vertebral body height is well maintained. Facet hypertrophic changes are noted at multiple levels. No acute fracture or acute facet abnormality is noted. Soft tissues and spinal canal: Surrounding soft tissue structures are within normal limits. Upper chest: Visualized lung apices are unremarkable. Other: None IMPRESSION: CT of the head: Atrophic changes without acute abnormality. CT of cervical spine: Multilevel degenerative change without acute abnormality. Electronically Signed   By: Inez Catalina M.D.   On: 02/21/2019 23:11   Dg Pelvis Portable  Result Date: 02/21/2019 CLINICAL DATA:  Fall, question hip fracture EXAM:  PORTABLE PELVIS 1-2 VIEWS COMPARISON:  None. FINDINGS: Coarse calcifications in the pelvis. Pubic symphysis is intact. Both femoral heads project in joint. There are mild degenerative changes. Possible fracture lucencies at the right superior and inferior pubic rami. IMPRESSION: Possible acute fractures involving the right superior and inferior pubic rami. Electronically Signed   By: Donavan Foil M.D.   On: 02/21/2019 23:01   Dg Pelvis Comp Min 3v  Result Date: 02/22/2019 CLINICAL DATA:  Pelvic fracture EXAM: JUDET PELVIS - 3+ VIEW COMPARISON:  the previous day's study FINDINGS: Minimally displaced fractures of the right superior ischial ramus and inferior ischiopubic ramus. No other pelvic ring fracture is identified. There is mild narrowing of articular cartilage in bilateral hips. No convincing femoral or acetabular fracture. No dislocation. Bilateral pelvic phleboliths. Mild spondylitic changes in the visualized lower lumbar spine. IMPRESSION: Minimally displaced right superior and inferior ischial ramus fractures. Electronically Signed   By: Lucrezia Europe M.D.   On: 02/22/2019 09:48   Dg Chest Port 1 View  Result Date: 02/21/2019 CLINICAL DATA:  Fall EXAM: PORTABLE CHEST 1 VIEW COMPARISON:  04/25/2015 FINDINGS: Left-sided pacing device with leads over right atrium and right ventricle. Mild cardiomegaly with aortic atherosclerosis. No acute opacity, pleural effusion or pneumothorax. Old right-sided rib fractures. IMPRESSION: No active disease.  Mild cardiomegaly Electronically Signed   By: Donavan Foil M.D.   On: 02/21/2019 22:59        Scheduled Meds:  metoprolol tartrate  75 mg Oral BID   Warfarin - Pharmacist Dosing Inpatient   Does not apply q1800   Continuous Infusions:   LOS: 0 days    Time spent: 34 min     Nicolette Bang, MD [facility]  If 7PM-7AM, please contact night-coverage  02/22/2019, 4:21 PM

## 2019-02-22 NOTE — TOC Initial Note (Signed)
Transition of Care [facility]) - Initial/Assessment Note    Patient Details  Name: Erica Carroll MRN: [id number] Date of Birth: 04-27-1922  Transition of Care [facility]) CM/SW Contact:    Candie Chroman, LCSW Phone Number: 02/22/2019, 2:59 PM  Clinical Narrative: CSW met with patient and daughter at bedside. CSW introduced role and explained that PT recommendations would be discussed. Notified them that CIR did not believe she has medical necessity for admission. Daughter requesting more information on this. CIR admissions coordinator gave permission to give daughter her phone number. Discussed potential for SNF placement and provided CMS scores for facilities within 25 miles of patient's zip code. Daughter asked about [facility]. They do take her insurance but no female bed right now and they are unsure when they will have one. Encouraged patient's daughter to select a second preference. No further concerns. CSW encouraged patient and her daughter to contact CSW as needed. CSW will continue to follow patient and her daughter for support and facilitate discharge to SNF once medically stable.            Expected Discharge Plan: Skilled Nursing Facility Barriers to Discharge: Insurance Authorization, Continued Medical Work up   Patient Goals and CMS Choice Patient states their goals for this hospitalization and ongoing recovery are:: Patient not fully oriented. CMS Medicare.gov Compare Post Acute Care list provided to:: Patient(Daughter at bedside.)    Expected Discharge Plan and Services Expected Discharge Plan: [place] arrangements for the past 2 months: Single Family Home                                      Prior Living Arrangements/Services Living arrangements for the past 2 months: Single Family Home Lives with:: Adult Children Patient language and need for interpreter reviewed:: Yes(No needs.) Do you feel safe going back to the place where you  live?: Yes      Need for Family Participation in Patient Care: Yes (Comment) Care giver support system in place?: Yes (comment)(SNF/CIR staff)   Criminal Activity/Legal Involvement Pertinent to Current Situation/Hospitalization: No - Comment as needed  Activities of Daily Living Home Assistive Devices/Equipment: Cane (specify quad or straight) ADL Screening (condition at time of admission) Patient's cognitive ability adequate to safely complete daily activities?: Yes Is the patient deaf or have difficulty hearing?: Yes Does the patient have difficulty seeing, even when wearing glasses/contacts?: No Does the patient have difficulty concentrating, remembering, or making decisions?: Yes Patient able to express need for assistance with ADLs?: Yes Does the patient have difficulty dressing or bathing?: Yes Independently performs ADLs?: No Does the patient have difficulty walking or climbing stairs?: Yes Weakness of Legs: Both Weakness of Arms/Hands: None  Permission Sought/Granted Permission sought to share information with : Facility Sport and exercise psychologist, Family Supports Permission granted to share information with : Yes, Verbal Permission Granted  Share Information with NAME: Della Goo  Permission granted to share info w AGENCY: SNF's  Permission granted to share info w Relationship: Daughter  Permission granted to share info w Contact Information: [phone]  Emotional Assessment Appearance:: Appears stated age Attitude/Demeanor/Rapport: Unable to Assess(Pleasant. Very hard of hearing so did not participate in conversation.) Affect (typically observed): Pleasant Orientation: : Oriented to Self, Oriented to Place Alcohol / Substance Use: Never Used Psych Involvement: No (comment)  Admission diagnosis:  Current use of long term anticoagulation [Z79.01] Fall, initial  encounter [id number]] Closed fracture of multiple pubic rami, right, initial encounter ([place])  [S32.591A] Pelvic fracture ([place]) [S32.9XXA] Patient Active Problem List   Diagnosis Date Noted  . Pelvic fracture ([place]) 02/22/2019  . Bilateral fracture of pubic rami ([place]) 02/22/2019  . Closed fracture of multiple pubic rami, right, initial encounter ([place])   . Mild dementia ([place]) 10/02/2018  . Environmental allergies   . Tachy-brady syndrome ([place]) 02/12/2015  . CKD stage G3b/A1, GFR 30-44 and albumin creatinine ratio <30 mg/g (HCC) 01/28/2015  . Essential hypertension 01/03/2014  . Other abnormal glucose 01/03/2014  . Medication management 10/31/2013  . Hyperlipidemia   . Vitamin D deficiency   . Long term current use of anticoagulant therapy 07/16/2013  . Sinus bradycardia 07/11/2013  . Paroxysmal atrial fibrillation ([place]) 07/10/2013  . GERD (gastroesophageal reflux disease) 07/10/2013  . History of TIA (transient ischemic attack) 06/25/2013   PCP:  Unk Pinto, MD Pharmacy:   [place], [place] - [address] [place] [address] Phone: [phone] Fax: [phone]  [id number] [place] [id number]: [phone]: [phone]    Social Determinants of Health (SDOH) Interventions    Readmission Risk Interventions No flowsheet data found.

## 2019-02-22 NOTE — Progress Notes (Signed)
Rehab Admissions Coordinator Note:  Patient was screened by Cleatrice Burke for appropriateness for an Inpatient Acute Rehab Consult per PT recs.   At this time, we are recommending [place] and 24/7 assist at d/c or SNF if that is not available. Patient lacks the medical necessity for an inpt rehab admission with her right pubic rami fractures.  Please call me with any questions.  Cleatrice Burke RN MSN 02/22/2019, 12:45 PM  I can be reached at [phone].

## 2019-02-22 NOTE — ED Notes (Signed)
ED TO INPATIENT HANDOFF REPORT  ED Nurse Name and Phone #:  Ita Fritzsche [phone]  S Name/Age/Gender Erica Carroll 85 y.o. female Room/Bed: 021C/021C  Code Status   Code Status: DNR  Home/SNF/Other Home Patient oriented to: self and place Is this baseline? Yes   Triage Complete: Triage complete  Chief Complaint Fall   Triage Note Pt. BIB EMS from home after fall while trying to reach for something. Per EMS no external rotation on scene. Pt. Fell on right side Denies hitting head/LOC. Pt states right arm sore and pain down right leg. Pt. Able to move leg with pain. Skin intact.       Allergies Allergies  Allergen Reactions  . Other Other (See Comments)    News Print: (freshly printed newspapers) cause her eyes to water . Allergy type reaction. Medal: itching & rash    Level of Care/Admitting Diagnosis ED Disposition    ED Disposition Condition Comment   Admit  Hospital Area: [facility] [[id number]]  Level of Care: Telemetry Medical [104]  I expect the patient will be discharged within 24 hours: No (not a candidate for 5C-Observation unit)  Covid Evaluation: Screening Protocol (No Symptoms)  Diagnosis: Pelvic fracture [facility]) [637858]  Admitting Physician: Rise Patience [phone]  Attending Physician: Rise Patience Lei.Right  PT Class (Do Not Modify): Observation [104]  PT Acc Code (Do Not Modify): Observation [10022]       B Medical/Surgery History Past Medical History:  Diagnosis Date  . A-fib ([place])    a. Dx 06/2013;  b. 06/2013 Echo:  EF 55-60%, no reg wma, mild AI, mod dil LA.  [place] Kitchen Allergy   . GERD (gastroesophageal reflux disease)   . Hyperlipidemia   . RA (rheumatoid arthritis) ([place])    " in my hands "  . Sinus bradycardia 07/11/2013  . TIA (transient ischemic attack)    a. with fall 05/2013 -> neg head CT and MRI/MRA;  b. Plavix started.  . Urinary incontinence   . Vitamin D deficiency    Past Surgical History:  Procedure  Laterality Date  . APPENDECTOMY  1936  . EP IMPLANTABLE DEVICE N/A 02/12/2015   Procedure: Pacemaker Implant;  Surgeon: Deboraha Sprang, MD;  Location: [facility];  Service: Cardiovascular;  Laterality: N/A;  . EYE SURGERY Right 1994   CE/IOL  . EYE SURGERY Left 1998   CE/IOL     A IV Location/Drains/Wounds Patient Lines/Drains/Airways Status   Active Line/Drains/Airways    None          Intake/Output Last 24 hours No intake or output data in the 24 hours ending 02/22/19 0037  Labs/Imaging No results found for this or any previous visit (from the past 54 hour(s)). Dg Lumbar Spine Complete  Result Date: 02/21/2019 CLINICAL DATA:  Status post fall, back pain EXAM: LUMBAR SPINE - COMPLETE 4+ VIEW COMPARISON:  None. FINDINGS: There are 5 nonrib bearing lumbar-type vertebral bodies. There is generalized osteopenia. The vertebral body heights are maintained. There is grade 1 anterolisthesis of L4 on L5 and L5 on S1. There is 3 mm retrolisthesis of L3 on L4. There is no spondylolysis. There is no acute fracture. There is degenerative disc disease with disc height loss at L3-4, L4-5 and L5-S1. There is degenerative disc disease with disc height loss at L1-2. The SI joints are unremarkable. IMPRESSION: No acute osseous injury of the lumbar spine. Electronically Signed   By: Kathreen Devoid   On: 02/21/2019 23:36  Ct Head Wo Contrast  Result Date: 02/21/2019 CLINICAL DATA:  Recent fall while on blood thinners EXAM: CT HEAD WITHOUT CONTRAST CT CERVICAL SPINE WITHOUT CONTRAST TECHNIQUE: Multidetector CT imaging of the head and cervical spine was performed following the standard protocol without intravenous contrast. Multiplanar CT image reconstructions of the cervical spine were also generated. COMPARISON:  01/28/2015 FINDINGS: CT HEAD FINDINGS Brain: No evidence of acute infarction, hemorrhage, hydrocephalus, extra-axial collection or mass lesion/mass effect. Mild atrophic changes are noted.  Vascular: No hyperdense vessel or unexpected calcification. Skull: Normal. Negative for fracture or focal lesion. Sinuses/Orbits: No acute finding. Other: None. CT CERVICAL SPINE FINDINGS Alignment: Within normal limits. Skull base and vertebrae: 7 cervical segments are well visualized. Vertebral body height is well maintained. Facet hypertrophic changes are noted at multiple levels. No acute fracture or acute facet abnormality is noted. Soft tissues and spinal canal: Surrounding soft tissue structures are within normal limits. Upper chest: Visualized lung apices are unremarkable. Other: None IMPRESSION: CT of the head: Atrophic changes without acute abnormality. CT of cervical spine: Multilevel degenerative change without acute abnormality. Electronically Signed   By: Inez Catalina M.D.   On: 02/21/2019 23:11   Ct Cervical Spine Wo Contrast  Result Date: 02/21/2019 CLINICAL DATA:  Recent fall while on blood thinners EXAM: CT HEAD WITHOUT CONTRAST CT CERVICAL SPINE WITHOUT CONTRAST TECHNIQUE: Multidetector CT imaging of the head and cervical spine was performed following the standard protocol without intravenous contrast. Multiplanar CT image reconstructions of the cervical spine were also generated. COMPARISON:  01/28/2015 FINDINGS: CT HEAD FINDINGS Brain: No evidence of acute infarction, hemorrhage, hydrocephalus, extra-axial collection or mass lesion/mass effect. Mild atrophic changes are noted. Vascular: No hyperdense vessel or unexpected calcification. Skull: Normal. Negative for fracture or focal lesion. Sinuses/Orbits: No acute finding. Other: None. CT CERVICAL SPINE FINDINGS Alignment: Within normal limits. Skull base and vertebrae: 7 cervical segments are well visualized. Vertebral body height is well maintained. Facet hypertrophic changes are noted at multiple levels. No acute fracture or acute facet abnormality is noted. Soft tissues and spinal canal: Surrounding soft tissue structures are within  normal limits. Upper chest: Visualized lung apices are unremarkable. Other: None IMPRESSION: CT of the head: Atrophic changes without acute abnormality. CT of cervical spine: Multilevel degenerative change without acute abnormality. Electronically Signed   By: Inez Catalina M.D.   On: 02/21/2019 23:11   Dg Pelvis Portable  Result Date: 02/21/2019 CLINICAL DATA:  Fall, question hip fracture EXAM: PORTABLE PELVIS 1-2 VIEWS COMPARISON:  None. FINDINGS: Coarse calcifications in the pelvis. Pubic symphysis is intact. Both femoral heads project in joint. There are mild degenerative changes. Possible fracture lucencies at the right superior and inferior pubic rami. IMPRESSION: Possible acute fractures involving the right superior and inferior pubic rami. Electronically Signed   By: Donavan Foil M.D.   On: 02/21/2019 23:01   Dg Chest Port 1 View  Result Date: 02/21/2019 CLINICAL DATA:  Fall EXAM: PORTABLE CHEST 1 VIEW COMPARISON:  04/25/2015 FINDINGS: Left-sided pacing device with leads over right atrium and right ventricle. Mild cardiomegaly with aortic atherosclerosis. No acute opacity, pleural effusion or pneumothorax. Old right-sided rib fractures. IMPRESSION: No active disease.  Mild cardiomegaly Electronically Signed   By: Donavan Foil M.D.   On: 02/21/2019 22:59    Pending Labs Unresulted Labs (From admission, onward)    Start     Ordered   02/22/19 6948  Basic metabolic panel  Tomorrow morning,   R     02/22/19 0011  02/22/19 0500  CBC  Tomorrow morning,   R     02/22/19 0011   02/22/19 0500  Protime-INR  Daily,   STAT     02/22/19 0015   02/21/19 2339  SARS Coronavirus 2 (CEPHEID - Performed in [place] hospital lab), Hosp Order  (Asymptomatic Patients Labs)  Once,   R    Question:  Rule Out  Answer:  Yes   02/21/19 2339   02/21/19 2232  Urinalysis, Routine w reflex microscopic  ONCE - STAT,   R     02/21/19 2233   02/21/19 2231  CBC with Differential  ONCE - STAT,   STAT     02/21/19  2233   02/21/19 2231  Comprehensive metabolic panel  ONCE - STAT,   STAT     02/21/19 2233   02/21/19 2231  Urinalysis, Routine w reflex microscopic  ONCE - STAT,   R     02/21/19 2233          Vitals/Pain Today's Vitals   02/21/19 2347 02/22/19 0000 02/22/19 0015 02/22/19 0030  BP: (!) 157/67 (!) 153/65 (!) 154/75 (!) 155/72  Pulse: 70 72 71 73  Resp: 18 19 20 20   Temp:      TempSrc:      SpO2: 100% 97% 97% 98%  PainSc:        Isolation Precautions No active isolations  Medications Medications  metoprolol tartrate (LOPRESSOR) tablet 75 mg (has no administration in time range)  acetaminophen (TYLENOL) tablet 650 mg (has no administration in time range)    Or  acetaminophen (TYLENOL) suppository 650 mg (has no administration in time range)  ondansetron (ZOFRAN) tablet 4 mg (has no administration in time range)    Or  ondansetron (ZOFRAN) injection 4 mg (has no administration in time range)  Warfarin - Pharmacist Dosing Inpatient (has no administration in time range)  fentaNYL (SUBLIMAZE) injection 50 mcg (50 mcg Intravenous Given 02/22/19 0035)    Mobility non-ambulatory High fall risk   Focused Assessments -   R Recommendations: See Admitting Provider Note  Report given to:   Additional Notes:

## 2019-02-22 NOTE — Consult Note (Signed)
Reason for Consult:Pubic rami fxs Referring Physician: C Spongberg  Erica Carroll is an 85 y.o. female.  HPI: Erica Carroll was at home and fell. The circumstances are a little unclear as the fall was unwitnessed but she likely tripped on something. She was not using her cane at the time. She had immediate right leg pain. Her son heard her from the other room and helped her up but due to the significant pain she came to the ED for evaluation. X-rays showed right superior and inferior pubic rami fxs and orthopedic surgery was consulted. She is comfortable in bed was hurts a lot when moved.  Past Medical History:  Diagnosis Date  . A-fib ([place])    a. Dx 06/2013;  b. 06/2013 Echo:  EF 55-60%, no reg wma, mild AI, mod dil LA.  Marland Kitchen Allergy   . GERD (gastroesophageal reflux disease)   . Hyperlipidemia   . RA (rheumatoid arthritis) ([place])    " in my hands "  . Sinus bradycardia 07/11/2013  . TIA (transient ischemic attack)    a. with fall 05/2013 -> neg head CT and MRI/MRA;  b. Plavix started.  . Urinary incontinence   . Vitamin D deficiency     Past Surgical History:  Procedure Laterality Date  . APPENDECTOMY  1936  . EP IMPLANTABLE DEVICE N/A 02/12/2015   Procedure: Pacemaker Implant;  Surgeon: Deboraha Sprang, MD;  Location: [facility];  Service: Cardiovascular;  Laterality: N/A;  . EYE SURGERY Right 1994   CE/IOL  . EYE SURGERY Left 1998   CE/IOL    Family History  Problem Relation Age of Onset  . Heart disease Mother   . Heart disease Sister   . Heart disease Brother   . Heart disease Brother   . CVA Sister   . Thyroid disease Sister     Social History:  reports that she has never smoked. She has never used smokeless tobacco. She reports current alcohol use. She reports that she does not use drugs.  Allergies:  Allergies  Allergen Reactions  . Other Other (See Comments)    News Print: (freshly printed newspapers) cause her eyes to water . Allergy type reaction. Medal:  itching & rash    Medications: I have reviewed the patient's current medications.  Results for orders placed or performed during the hospital encounter of 02/21/19 (from the past 48 hour(s))  CBC with Differential     Status: Abnormal   Collection Time: 02/22/19 12:08 AM  Result Value Ref Range   WBC 12.5 (H) 4.0 - 10.5 K/uL   RBC 3.73 (L) 3.87 - 5.11 MIL/uL   Hemoglobin 11.6 (L) 12.0 - 15.0 g/dL   HCT 34.0 (L) 36.0 - 46.0 %   MCV 91.2 80.0 - 100.0 fL   MCH 31.1 26.0 - 34.0 pg   MCHC 34.1 30.0 - 36.0 g/dL   RDW 12.4 11.5 - 15.5 %   Platelets PLATELET CLUMPS NOTED ON SMEAR, UNABLE TO ESTIMATE 150 - 400 K/uL    Comment: Immature Platelet Fraction may be clinically indicated, consider ordering this additional test [id number]    nRBC 0.0 0.0 - 0.2 %   Neutrophils Relative % 83 %   Neutro Abs 10.5 (H) 1.7 - 7.7 K/uL   Lymphocytes Relative 9 %   Lymphs Abs 1.1 0.7 - 4.0 K/uL   Monocytes Relative 6 %   Monocytes Absolute 0.8 0.1 - 1.0 K/uL   Eosinophils Relative 1 %   Eosinophils Absolute  0.1 0.0 - 0.5 K/uL   Basophils Relative 0 %   Basophils Absolute 0.0 0.0 - 0.1 K/uL   Immature Granulocytes 1 %   Abs Immature Granulocytes 0.06 0.00 - 0.07 K/uL    Comment: Performed at [facility] [address], [place], [place] [address]  Comprehensive metabolic panel     Status: Abnormal   Collection Time: 02/22/19 12:08 AM  Result Value Ref Range   Sodium 132 (L) 135 - 145 mmol/L   Potassium 4.5 3.5 - 5.1 mmol/L   Chloride 100 98 - 111 mmol/L   CO2 22 22 - 32 mmol/L   Glucose, Bld 123 (H) 70 - 99 mg/dL   BUN 10 8 - 23 mg/dL   Creatinine, Ser 1.30 (H) 0.44 - 1.00 mg/dL   Calcium 8.7 (L) 8.9 - 10.3 mg/dL   Total Protein 6.9 6.5 - 8.1 g/dL   Albumin 3.8 3.5 - 5.0 g/dL   AST 29 15 - 41 U/L   ALT 12 0 - 44 U/L   Alkaline Phosphatase 55 38 - 126 U/L   Total Bilirubin 0.9 0.3 - 1.2 mg/dL   GFR calc non Af Amer 35 (L) >60 mL/min   GFR calc Af Amer 41 (L) >60 mL/min   Anion gap 10  5 - 15    Comment: Performed at [facility] [address], [place], [place] [address]  SARS Coronavirus 2 (CEPHEID - Performed in [facility] hospital lab), Hosp Order     Status: None   Collection Time: 02/22/19 12:14 AM  Result Value Ref Range   SARS Coronavirus 2 NEGATIVE NEGATIVE    Comment: (NOTE) If result is NEGATIVE SARS-CoV-2 target nucleic acids are NOT DETECTED. The SARS-CoV-2 RNA is generally detectable in upper and lower  respiratory specimens during the acute phase of infection. The lowest  concentration of SARS-CoV-2 viral copies this assay can detect is 250  copies / mL. A negative result does not preclude SARS-CoV-2 infection  and should not be used as the sole basis for treatment or other  patient management decisions.  A negative result may occur with  improper specimen collection / handling, submission of specimen other  than nasopharyngeal swab, presence of viral mutation(s) within the  areas targeted by this assay, and inadequate number of viral copies  (<250 copies / mL). A negative result must be combined with clinical  observations, patient history, and epidemiological information. If result is POSITIVE SARS-CoV-2 target nucleic acids are DETECTED. The SARS-CoV-2 RNA is generally detectable in upper and lower  respiratory specimens dur ing the acute phase of infection.  Positive  results are indicative of active infection with SARS-CoV-2.  Clinical  correlation with patient history and other diagnostic information is  necessary to determine patient infection status.  Positive results do  not rule out bacterial infection or co-infection with other viruses. If result is PRESUMPTIVE POSTIVE SARS-CoV-2 nucleic acids MAY BE PRESENT.   A presumptive positive result was obtained on the submitted specimen  and confirmed on repeat testing.  While 2019 novel coronavirus  (SARS-CoV-2) nucleic acids may be present in the submitted sample  additional  confirmatory testing may be necessary for epidemiological  and / or clinical management purposes  to differentiate between  SARS-CoV-2 and other Sarbecovirus currently known to infect humans.  If clinically indicated additional testing with an alternate test  methodology [id number]) is advised. The SARS-CoV-2 RNA is generally  detectable in upper and lower respiratory sp ecimens  during the acute  phase of infection. The expected result is Negative. Fact Sheet for Patients:  StrictlyIdeas.no Fact Sheet for Healthcare Providers: BankingDealers.co.za This test is not yet approved or cleared by the [place] FDA and has been authorized for detection and/or diagnosis of SARS-CoV-2 by FDA under an Emergency Use Authorization (EUA).  This EUA will remain in effect (meaning this test can be used) for the duration of the COVID-19 declaration under Section 564(b)(1) of the Act, 21 U.S.C. section 360bbb-3(b)(1), unless the authorization is terminated or revoked sooner. Performed at [facility], [place] [address] [place], [place] [address]   Basic metabolic panel     Status: Abnormal   Collection Time: 02/22/19  4:36 AM  Result Value Ref Range   Sodium 132 (L) 135 - 145 mmol/L   Potassium 4.3 3.5 - 5.1 mmol/L   Chloride 101 98 - 111 mmol/L   CO2 23 22 - 32 mmol/L   Glucose, Bld 127 (H) 70 - 99 mg/dL   BUN 11 8 - 23 mg/dL   Creatinine, Ser 1.21 (H) 0.44 - 1.00 mg/dL   Calcium 8.6 (L) 8.9 - 10.3 mg/dL   GFR calc non Af Amer 38 (L) >60 mL/min   GFR calc Af Amer 44 (L) >60 mL/min   Anion gap 8 5 - 15    Comment: Performed at [facility] [address] [place], [place] [address]  CBC     Status: Abnormal   Collection Time: 02/22/19  4:36 AM  Result Value Ref Range   WBC 9.8 4.0 - 10.5 K/uL   RBC 3.30 (L) 3.87 - 5.11 MIL/uL   Hemoglobin 10.2 (L) 12.0 - 15.0 g/dL   HCT 29.4 (L) 36.0 - 46.0 %   MCV 89.1 80.0 - 100.0 fL   MCH 30.9  26.0 - 34.0 pg   MCHC 34.7 30.0 - 36.0 g/dL   RDW 12.3 11.5 - 15.5 %   Platelets 140 (L) 150 - 400 K/uL   nRBC 0.0 0.0 - 0.2 %    Comment: Performed at [facility], [place] [address], [place], [place] [address]  Protime-INR     Status: Abnormal   Collection Time: 02/22/19  4:36 AM  Result Value Ref Range   Prothrombin Time 23.7 (H) 11.4 - 15.2 seconds   INR 2.1 (H) 0.8 - 1.2    Comment: (NOTE) INR goal varies based on device and disease states. Performed at [facility], [place] [address] [place], [place] [address]   CBC with Differential/Platelet     Status: Abnormal   Collection Time: 02/22/19  4:36 AM  Result Value Ref Range   WBC 9.9 4.0 - 10.5 K/uL   RBC 3.33 (L) 3.87 - 5.11 MIL/uL   Hemoglobin 10.3 (L) 12.0 - 15.0 g/dL   HCT 29.9 (L) 36.0 - 46.0 %   MCV 89.8 80.0 - 100.0 fL   MCH 30.9 26.0 - 34.0 pg   MCHC 34.4 30.0 - 36.0 g/dL   RDW 12.4 11.5 - 15.5 %   Platelets 131 (L) 150 - 400 K/uL   nRBC 0.0 0.0 - 0.2 %   Neutrophils Relative % 77 %   Neutro Abs 7.7 1.7 - 7.7 K/uL   Lymphocytes Relative 14 %   Lymphs Abs 1.4 0.7 - 4.0 K/uL   Monocytes Relative 7 %   Monocytes Absolute 0.7 0.1 - 1.0 K/uL   Eosinophils Relative 1 %   Eosinophils Absolute 0.1  0.0 - 0.5 K/uL   Basophils Relative 0 %   Basophils Absolute 0.0 0.0 - 0.1 K/uL   Immature Granulocytes 1 %   Abs Immature Granulocytes 0.05 0.00 - 0.07 K/uL    Comment: Performed at [facility] [address] [place], [place] [address]  CBC with Differential/Platelet     Status: None (Preliminary result)   Collection Time: 02/22/19  5:00 AM  Result Value Ref Range   WBC DUP SEE H7204 K/uL   RBC DUP SEE H7204 MIL/uL   Hemoglobin DUP SEE H7204 g/dL   HCT DUP SEE H7204 %   MCV DUP SEE H7204 fL   MCH DUP SEE H7204 pg   MCHC DUP SEE H7204 g/dL   RDW DUP SEE H7204 %   Platelets DUP SEE H7204 K/uL   nRBC DUP SEE H7204 %   Neutrophils Relative % PENDING %   Neutro Abs PENDING K/uL   Band Neutrophils  PENDING %   Lymphocytes Relative PENDING %   Lymphs Abs PENDING K/uL   Monocytes Relative PENDING %   Monocytes Absolute PENDING K/uL   Eosinophils Relative PENDING %   Eosinophils Absolute PENDING K/uL   Basophils Relative PENDING %   Basophils Absolute PENDING K/uL   WBC Morphology PENDING    RBC Morphology PENDING    Smear Review PENDING    Other PENDING %   nRBC PENDING /100 WBC   Metamyelocytes Relative PENDING %   Myelocytes PENDING %   Promyelocytes Relative PENDING %   Blasts PENDING %    Dg Lumbar Spine Complete  Result Date: 02/21/2019 CLINICAL DATA:  Status post fall, back pain EXAM: LUMBAR SPINE - COMPLETE 4+ VIEW COMPARISON:  None. FINDINGS: There are 5 nonrib bearing lumbar-type vertebral bodies. There is generalized osteopenia. The vertebral body heights are maintained. There is grade 1 anterolisthesis of L4 on L5 and L5 on S1. There is 3 mm retrolisthesis of L3 on L4. There is no spondylolysis. There is no acute fracture. There is degenerative disc disease with disc height loss at L3-4, L4-5 and L5-S1. There is degenerative disc disease with disc height loss at L1-2. The SI joints are unremarkable. IMPRESSION: No acute osseous injury of the lumbar spine. Electronically Signed   By: Kathreen Devoid   On: 02/21/2019 23:36   Ct Head Wo Contrast  Result Date: 02/21/2019 CLINICAL DATA:  Recent fall while on blood thinners EXAM: CT HEAD WITHOUT CONTRAST CT CERVICAL SPINE WITHOUT CONTRAST TECHNIQUE: Multidetector CT imaging of the head and cervical spine was performed following the standard protocol without intravenous contrast. Multiplanar CT image reconstructions of the cervical spine were also generated. COMPARISON:  01/28/2015 FINDINGS: CT HEAD FINDINGS Brain: No evidence of acute infarction, hemorrhage, hydrocephalus, extra-axial collection or mass lesion/mass effect. Mild atrophic changes are noted. Vascular: No hyperdense vessel or unexpected calcification. Skull: Normal.  Negative for fracture or focal lesion. Sinuses/Orbits: No acute finding. Other: None. CT CERVICAL SPINE FINDINGS Alignment: Within normal limits. Skull base and vertebrae: 7 cervical segments are well visualized. Vertebral body height is well maintained. Facet hypertrophic changes are noted at multiple levels. No acute fracture or acute facet abnormality is noted. Soft tissues and spinal canal: Surrounding soft tissue structures are within normal limits. Upper chest: Visualized lung apices are unremarkable. Other: None IMPRESSION: CT of the head: Atrophic changes without acute abnormality. CT of cervical spine: Multilevel degenerative change without acute abnormality. Electronically Signed   By: Inez Catalina M.D.   On: 02/21/2019  23:11   Ct Cervical Spine Wo Contrast  Result Date: 02/21/2019 CLINICAL DATA:  Recent fall while on blood thinners EXAM: CT HEAD WITHOUT CONTRAST CT CERVICAL SPINE WITHOUT CONTRAST TECHNIQUE: Multidetector CT imaging of the head and cervical spine was performed following the standard protocol without intravenous contrast. Multiplanar CT image reconstructions of the cervical spine were also generated. COMPARISON:  01/28/2015 FINDINGS: CT HEAD FINDINGS Brain: No evidence of acute infarction, hemorrhage, hydrocephalus, extra-axial collection or mass lesion/mass effect. Mild atrophic changes are noted. Vascular: No hyperdense vessel or unexpected calcification. Skull: Normal. Negative for fracture or focal lesion. Sinuses/Orbits: No acute finding. Other: None. CT CERVICAL SPINE FINDINGS Alignment: Within normal limits. Skull base and vertebrae: 7 cervical segments are well visualized. Vertebral body height is well maintained. Facet hypertrophic changes are noted at multiple levels. No acute fracture or acute facet abnormality is noted. Soft tissues and spinal canal: Surrounding soft tissue structures are within normal limits. Upper chest: Visualized lung apices are unremarkable. Other: None  IMPRESSION: CT of the head: Atrophic changes without acute abnormality. CT of cervical spine: Multilevel degenerative change without acute abnormality. Electronically Signed   By: Inez Catalina M.D.   On: 02/21/2019 23:11   Dg Pelvis Portable  Result Date: 02/21/2019 CLINICAL DATA:  Fall, question hip fracture EXAM: PORTABLE PELVIS 1-2 VIEWS COMPARISON:  None. FINDINGS: Coarse calcifications in the pelvis. Pubic symphysis is intact. Both femoral heads project in joint. There are mild degenerative changes. Possible fracture lucencies at the right superior and inferior pubic rami. IMPRESSION: Possible acute fractures involving the right superior and inferior pubic rami. Electronically Signed   By: Donavan Foil M.D.   On: 02/21/2019 23:01   Dg Pelvis Comp Min 3v  Result Date: 02/22/2019 CLINICAL DATA:  Pelvic fracture EXAM: JUDET PELVIS - 3+ VIEW COMPARISON:  the previous day's study FINDINGS: Minimally displaced fractures of the right superior ischial ramus and inferior ischiopubic ramus. No other pelvic ring fracture is identified. There is mild narrowing of articular cartilage in bilateral hips. No convincing femoral or acetabular fracture. No dislocation. Bilateral pelvic phleboliths. Mild spondylitic changes in the visualized lower lumbar spine. IMPRESSION: Minimally displaced right superior and inferior ischial ramus fractures. Electronically Signed   By: Lucrezia Europe M.D.   On: 02/22/2019 09:48   Dg Chest Port 1 View  Result Date: 02/21/2019 CLINICAL DATA:  Fall EXAM: PORTABLE CHEST 1 VIEW COMPARISON:  04/25/2015 FINDINGS: Left-sided pacing device with leads over right atrium and right ventricle. Mild cardiomegaly with aortic atherosclerosis. No acute opacity, pleural effusion or pneumothorax. Old right-sided rib fractures. IMPRESSION: No active disease.  Mild cardiomegaly Electronically Signed   By: Donavan Foil M.D.   On: 02/21/2019 22:59    Review of Systems  Constitutional: Negative for weight  loss.  HENT: Negative for ear discharge, ear pain, hearing loss and tinnitus.   Eyes: Negative for blurred vision, double vision, photophobia and pain.  Respiratory: Negative for cough, sputum production and shortness of breath.   Cardiovascular: Negative for chest pain.  Gastrointestinal: Negative for abdominal pain, nausea and vomiting.  Genitourinary: Negative for dysuria, flank pain, frequency and urgency.  Musculoskeletal: Positive for joint pain (Right hip) and myalgias (Right thigh, groin). Negative for back pain, falls and neck pain.  Neurological: Negative for dizziness, tingling, sensory change, focal weakness, loss of consciousness and headaches.  Endo/Heme/Allergies: Does not bruise/bleed easily.  Psychiatric/Behavioral: Negative for depression, memory loss and substance abuse. The patient is not nervous/anxious.    Blood pressure Marland Kitchen)  147/59, pulse 64, temperature 97.7 F (36.5 C), temperature source Oral, resp. rate 20, height 5\' 4"  (1.626 m), weight 68 kg, SpO2 96 %. Physical Exam  Constitutional: She appears well-developed and well-nourished. No distress.  HENT:  Head: Normocephalic and atraumatic.  Eyes: Conjunctivae are normal. Right eye exhibits no discharge. Left eye exhibits no discharge. No scleral icterus.  Neck: Normal range of motion.  Cardiovascular: Normal rate and regular rhythm.  Respiratory: Effort normal. No respiratory distress.  Musculoskeletal:     Comments: Pelvis--no traumatic wounds or rash, no ecchymosis, stable to manual stress, nontender  LLE No traumatic wounds, ecchymosis, or rash  Nontender  No knee or ankle effusion  Knee stable to varus/ valgus and anterior/posterior stress  Sens DPN, SPN, TN intact  Motor EHL, ext, flex, evers 5/5  DP 1+, PT 1+, No significant edema   RLE No traumatic wounds, ecchymosis, or rash  Nontender  No knee or ankle effusion  Knee stable to varus/ valgus and anterior/posterior stress  Sens DPN, SPN, TN  intact  Motor EHL, ext, flex, evers 5/5  DP 1+, PT 1+, No significant edema  Neurological: She is alert.  Skin: Skin is warm and dry. She is not diaphoretic.  Psychiatric: She has a normal mood and affect. Her behavior is normal.    Assessment/Plan: Right superior/inferior pubic rami fxs -- Will get additional pelvic x-rays for monitoring. She may be WBAT BLE, will probably need to graduate to a RW until these heal. PT/OT to start today. She should f/u with Dr. Doreatha Martin in 3-4 weeks. Multiple medical problems including atrial fibrillation on metoprolol and Coumadin, sinus bradycardia status post pacemaker placement, chronic kidney disease stage III, chronic anemia, and mild dementia -- per primary service    Lisette Abu, PA-C Orthopedic Surgery [phone] 02/22/2019, 9:50 AM

## 2019-02-22 NOTE — Progress Notes (Signed)
Patient arrived to unit with daughter present due to confusion.  Daughter staying at bedside.  Patient placed in low bed due to admission for fall and patient age.  Low bed was zeroed before patient placed in bed.  NT attempted to weight patient which showed 180lb.  Pt daughter stated that is not an accurate weight for patient and she is usually around 150lb.  Due to patient pain, staff unable to get standing weight.  This RN asked day RN for PT to attempt standing weight if patient was able to get out of bed.  Pt has not walked since fall.

## 2019-02-22 NOTE — Evaluation (Signed)
Physical Therapy Evaluation Patient Details Name: Erica Carroll MRN: [id number] DOB: 08-Jul-1922 Today's Date: 02/22/2019   History of Present Illness  85 yo s/p fall at home with Right pubic rami fx. PMHx: AFib, CKD, HOH, mild dementia, HTN  Clinical Impression  Pt pleasant, looks younger than her age. Pt fearful of moving due to pain but able to transfer to [facility] and recliner this session. PT lives with son who works, daughter lives out of town and pt currently mod assist to transfer and toilet. Pt with decreased strength, transfers, gait, functional mobility and cognition who is normally very independent and moving around at home and making crafts. Pt and family desire return home after short rehab stay. Pt would benefit from acute therapy to maximize mobility, safety and balance to decrease fall risk and burden of care.      Follow Up Recommendations CIR;Supervision/Assistance - 24 hour    Equipment Recommendations  Rolling walker with 5" wheels;3in1 (PT)    Recommendations for Other Services OT consult;Rehab consult     Precautions / Restrictions Precautions Precautions: Fall Restrictions Weight Bearing Restrictions: Yes RLE Weight Bearing: Weight bearing as tolerated LLE Weight Bearing: Weight bearing as tolerated      Mobility  Bed Mobility Overal bed mobility: Needs Assistance Bed Mobility: Supine to Sit     Supine to sit: Mod assist     General bed mobility comments: mod assist to pivot to right side of bed with multimodal cues, physical assist and increased time  Transfers Overall transfer level: Needs assistance   Transfers: Sit to/from Stand;Stand Pivot Transfers Sit to Stand: Mod assist;+2 safety/equipment;From elevated surface Stand pivot transfers: Mod assist       General transfer comment: initial stand from bed Mod +2 assist to rise with cues for sequence and hand placement with pivot bed to [facility] toward left with RW. Mod assist to stand from [facility] and  pivot to recliner. Pt able to stand statically with bil UE support for pericare  Ambulation/Gait             General Gait Details: limited to pivots today due to pt's tolerance  Stairs            Wheelchair Mobility    Modified Rankin (Stroke Patients Only)       Balance Overall balance assessment: History of Falls;Needs assistance   Sitting balance-Leahy Scale: Fair     Standing balance support: Bilateral upper extremity supported Standing balance-Leahy Scale: Poor                               Pertinent Vitals/Pain Pain Assessment: 0-10 Pain Score: 6  Pain Location: pelvis Pain Descriptors / Indicators: Aching;Constant;Discomfort Pain Intervention(s): Limited activity within patient's tolerance;Monitored during session;Repositioned    Home Living Family/patient expects to be discharged to:: Private residence Living Arrangements: Children Available Help at Discharge: Available PRN/intermittently Type of Home: House Home Access: Stairs to enter   [employer] of Steps: 3 Home Layout: One level Home Equipment: [place] - 4 wheels;Cane - single point      Prior Function Level of Independence: Independent with assistive device(s)         Comments: walks with cane, able to bathe and dress herself. Some small cooking with supervision. Son does the homemaking and driving     Hand Dominance        Extremity/Trunk Assessment   Upper Extremity Assessment Upper Extremity Assessment: Generalized weakness  Lower Extremity Assessment Lower Extremity Assessment: RLE deficits/detail;LLE deficits/detail RLE Deficits / Details: decreased hip movement and strength due to pelvic pain, grossly functional strength LLE Deficits / Details: decreased hip movement and strength due to pelvic pain, grossly functional strength    Cervical / Trunk Assessment Cervical / Trunk Assessment: Normal  Communication   Communication: HOH  Cognition  Arousal/Alertness: Awake/alert Behavior During Therapy: WFL for tasks assessed/performed Overall Cognitive Status: Impaired/Different from baseline Area of Impairment: Orientation;Memory;Problem solving;Following commands                 Orientation Level: Disoriented to;Situation   Memory: Decreased short-term memory Following Commands: Follows one step commands with increased time     Problem Solving: Slow processing;Difficulty sequencing;Requires verbal cues;Requires tactile cues;Decreased initiation        General Comments      Exercises     Assessment/Plan    PT Assessment Patient needs continued PT services  PT Problem List Decreased strength;Decreased balance;Decreased cognition;Decreased knowledge of precautions;Pain;Decreased range of motion;Decreased mobility;Decreased knowledge of use of DME;Decreased activity tolerance;Decreased coordination;Decreased safety awareness       PT Treatment Interventions Gait training;Therapeutic activities;Neuromuscular re-education;Therapeutic exercise;Stair training;Cognitive remediation;Patient/family education;Balance training;Functional mobility training;DME instruction    PT Goals (Current goals can be found in the Care Plan section)  Acute Rehab PT Goals Patient Stated Goal: return home, make flower arrangements PT Goal Formulation: With patient/family Time For Goal Achievement: 03/08/19 Potential to Achieve Goals: Good    Frequency Min 4X/week   Barriers to discharge Decreased caregiver support      Co-evaluation               AM-PAC PT "6 Clicks" Mobility  Outcome Measure Help needed turning from your back to your side while in a flat bed without using bedrails?: A Lot Help needed moving from lying on your back to sitting on the side of a flat bed without using bedrails?: A Lot Help needed moving to and from a bed to a chair (including a wheelchair)?: A Lot Help needed standing up from a chair using  your arms (e.g., wheelchair or bedside chair)?: A Lot Help needed to walk in hospital room?: A Lot Help needed climbing 3-5 steps with a railing? : Total 6 Click Score: 11    End of Session Equipment Utilized During Treatment: Gait belt Activity Tolerance: Patient tolerated treatment well Patient left: in chair;with call bell/phone within reach;with chair alarm set;with family/visitor present;with nursing/sitter in room Nurse Communication: Mobility status;Precautions;Weight bearing status PT Visit Diagnosis: Other abnormalities of gait and mobility (R26.89);Muscle weakness (generalized) (M62.81);Pain;History of falling (Z91.81) Pain - Right/Left: Right Pain - part of body: Hip    Time: 0300-9233 PT Time Calculation (min) (ACUTE ONLY): 28 min   Charges:   PT Evaluation $PT Eval Moderate Complexity: 1 Mod PT Treatments $Therapeutic Activity: 8-22 mins        Caitrin Pendergraph Pam Drown, PT Acute Rehabilitation Services Pager: [phone] Office: [phone]   Ayaka Andes B Rodney Wigger 02/22/2019, 11:27 AM

## 2019-02-22 NOTE — Progress Notes (Addendum)
PT Cancellation Note  Patient Details Name: Erica Carroll MRN: [id number] DOB: 07-May-1922   Cancelled Treatment:    Reason Eval/Treat Not Completed: Other (comment)(await ortho consult and weight bearing status)   [name] B [name] 02/22/2019, 7:06 AM  Elwyn Reach, PT Acute Rehabilitation Services Pager: [phone] Office: [phone]

## 2019-02-22 NOTE — NC FL2 (Signed)
[place] MEDICAID FL2 LEVEL OF CARE SCREENING TOOL     IDENTIFICATION  Patient Name: Erica Carroll Birthdate: May 31, 1922 Sex: female Admission Date (Current Location): 02/21/2019  [facility] and [place] Number:  Herbalist and Address:  [facility] [place], [place] [address] [place], [place] [address]      Provider Number: [id number]  Attending Physician Name and Address:  Marcell Anger*  Relative Name and Phone Number:       Current Level of Care: Hospital Recommended Level of Care: [place] Prior Approval Number:    Date Approved/Denied:   PASRR Number: [id number]  Discharge Plan: SNF    Current Diagnoses: Patient Active Problem List   Diagnosis Date Noted  . Pelvic fracture ([place]) 02/22/2019  . Bilateral fracture of pubic rami ([place]) 02/22/2019  . Closed fracture of multiple pubic rami, right, initial encounter ([place])   . Mild dementia ([place]) 10/02/2018  . Environmental allergies   . Tachy-brady syndrome ([place]) 02/12/2015  . CKD stage G3b/A1, GFR 30-44 and albumin creatinine ratio <30 mg/g (HCC) 01/28/2015  . Essential hypertension 01/03/2014  . Other abnormal glucose 01/03/2014  . Medication management 10/31/2013  . Hyperlipidemia   . Vitamin D deficiency   . Long term current use of anticoagulant therapy 07/16/2013  . Sinus bradycardia 07/11/2013  . Paroxysmal atrial fibrillation ([place]) 07/10/2013  . GERD (gastroesophageal reflux disease) 07/10/2013  . History of TIA (transient ischemic attack) 06/25/2013    Orientation RESPIRATION BLADDER Height & Weight     Self, Place(Very hard of hearing so they may affect it.)  Normal Continent, External catheter Weight: 150 lb (68 kg) Height:  5\' 4"  (162.6 cm)  BEHAVIORAL SYMPTOMS/MOOD NEUROLOGICAL BOWEL NUTRITION STATUS  (None) (Mild dementia) Continent Diet  AMBULATORY STATUS COMMUNICATION OF NEEDS Skin   Extensive Assist Verbally Bruising                        Personal Care Assistance Level of Assistance  Bathing, Feeding, Dressing Bathing Assistance: Limited assistance Feeding assistance: Independent Dressing Assistance: Maximum assistance     Functional Limitations Info  Sight, Hearing, Speech Sight Info: Adequate Hearing Info: Impaired Speech Info: Adequate    SPECIAL CARE FACTORS FREQUENCY  PT (By licensed PT), OT (By licensed OT)     PT Frequency: 5 x week OT Frequency: 5 x week            Contractures Contractures Info: Not present    Additional Factors Info  Code Status, Allergies Code Status Info: DNR Allergies Info: Other: News Print: (freshly printed newspapers) cause her eyes to water.           Current Medications (02/22/2019):  This is the current hospital active medication list Current Facility-Administered Medications  Medication Dose Route Frequency Provider Last Rate Last Dose  . acetaminophen (TYLENOL) tablet 650 mg  650 mg Oral Q6H PRN Rise Patience, MD   650 mg at 02/22/19 1113   Or  . acetaminophen (TYLENOL) suppository 650 mg  650 mg Rectal Q6H PRN Rise Patience, MD      . fentaNYL (SUBLIMAZE) injection 12.5 mcg  12.5 mcg Intravenous Q2H PRN Rise Patience, MD      . metoprolol tartrate (LOPRESSOR) tablet 75 mg  75 mg Oral BID Rise Patience, MD   75 mg at 02/22/19 0843  . ondansetron (ZOFRAN) tablet 4 mg  4 mg Oral Q6H PRN Rise Patience, MD  Or  . ondansetron (ZOFRAN) injection 4 mg  4 mg Intravenous Q6H PRN Rise Patience, MD      . warfarin (COUMADIN) tablet 2.5 mg  2.5 mg Oral ONCE-1800 Spongberg, Audie Pinto, MD      . Warfarin - Pharmacist Dosing Inpatient   Does not apply q1800 Laren Everts, [facility]         Discharge Medications: Please see discharge summary for a list of discharge medications.  Relevant Imaging Results:  Relevant Lab Results:   Additional Information SS#: [id number]  Candie Chroman, LCSW

## 2019-02-22 NOTE — Progress Notes (Signed)
ANTICOAGULATION CONSULT NOTE - Initial Consult  Pharmacy Consult for Coumadin Indication: atrial fibrillation  Allergies  Allergen Reactions  . Other Other (See Comments)    News Print: (freshly printed newspapers) cause her eyes to water . Allergy type reaction. Medal: itching & rash    Vital Signs: Temp: 97.7 F (36.5 C) (05/28 0637) Temp Source: Oral (05/28 0637) BP: 147/59 (05/28 6811) Pulse Rate: 64 (05/28 5726)   Medical History: Past Medical History:  Diagnosis Date  . A-fib ([place])    a. Dx 06/2013;  b. 06/2013 Echo:  EF 55-60%, no reg wma, mild AI, mod dil LA.  Marland Kitchen Allergy   . GERD (gastroesophageal reflux disease)   . Hyperlipidemia   . RA (rheumatoid arthritis) ([place])    " in my hands "  . Sinus bradycardia 07/11/2013  . TIA (transient ischemic attack)    a. with fall 05/2013 -> neg head CT and MRI/MRA;  b. Plavix started.  . Urinary incontinence   . Vitamin D deficiency     Assessment: 85yo female presents after fall while trying to reach for something, to continue Coumadin for Afib during admission.  INR this AM = 2.1  Home regimen 5 mg daily except 2.5 mg Thur/Sun  Goal of Therapy:  INR 2-3   Plan:  Warfarin 2.5 mg po x 1 tonight Daily INR  Thank you Anette Guarneri, PharmD [phone]  02/22/2019,9:17 AM

## 2019-02-22 NOTE — Plan of Care (Signed)
  Problem: Acute Rehab OT Goals (only OT should resolve) Goal: Pt. Will Perform Grooming Flowsheets (Taken 02/22/2019 1838) Pt Will Perform Grooming: with set-up; sitting Goal: Pt. Will Perform Upper Body Bathing Flowsheets (Taken 02/22/2019 1838) Pt Will Perform Upper Body Bathing: with set-up; sitting Goal: Pt. Will Perform Lower Body Bathing Flowsheets (Taken 02/22/2019 1838) Pt Will Perform Lower Body Bathing: with mod assist; sitting/lateral leans; sit to/from stand; with caregiver independent in assisting Goal: Pt. Will Transfer To Toilet Flowsheets (Taken 02/22/2019 1838) Pt Will Transfer to Toilet: with min assist; ambulating; bedside commode Goal: Pt. Will Perform Toileting-Clothing Manipulation Flowsheets (Taken 02/22/2019 1838) Pt Will Perform Toileting - Clothing Manipulation and hygiene: with mod assist; sit to/from stand; sitting/lateral leans; with caregiver independent in assisting Goal: Pt/Caregiver Will Perform Home Exercise Program Flowsheets (Taken 02/22/2019 1838) Pt/caregiver will Perform Home Exercise Program: Increased strength; Both right and left upper extremity; With written HEP provided; With Supervision

## 2019-02-22 NOTE — Progress Notes (Signed)
ANTICOAGULATION CONSULT NOTE - Initial Consult  Pharmacy Consult for Coumadin Indication: atrial fibrillation  Allergies  Allergen Reactions  . Other Other (See Comments)    News Print: (freshly printed newspapers) cause her eyes to water . Allergy type reaction. Medal: itching & rash    Vital Signs: Temp: 98.1 F (36.7 C) (05/27 2217) Temp Source: Oral (05/27 2217) BP: 157/67 (05/27 2347) Pulse Rate: 70 (05/27 2347)   Medical History: Past Medical History:  Diagnosis Date  . A-fib ([place])    a. Dx 06/2013;  b. 06/2013 Echo:  EF 55-60%, no reg wma, mild AI, mod dil LA.  Marland Kitchen Allergy   . GERD (gastroesophageal reflux disease)   . Hyperlipidemia   . RA (rheumatoid arthritis) ([place])    " in my hands "  . Sinus bradycardia 07/11/2013  . TIA (transient ischemic attack)    a. with fall 05/2013 -> neg head CT and MRI/MRA;  b. Plavix started.  . Urinary incontinence   . Vitamin D deficiency     Assessment: 85yo female presents after fall while trying to reach for something, to continue Coumadin for Afib during admission.  Goal of Therapy:  INR 2-3   Plan:  Will monitor daily INR for Coumadin dose adjustments.  Wynona Neat, PharmD, BCPS  02/22/2019,12:15 AM

## 2019-02-22 NOTE — Evaluation (Signed)
Occupational Therapy Evaluation Patient Details Name: Erica Carroll MRN: [id number] DOB: 1922-06-21 Today's Date: 02/22/2019    History of Present Illness 85 yo s/p fall at home with Right pubic rami fx. PMHx: AFib, CKD, HOH, mild dementia, HTN   Clinical Impression   Patient sleeping in bed upon therapy arrival with Daughter present in room due to patient's confusion. Attempted to transfer patient to [facility] per request although unable to complete transfer. Due to patient's confusion and her increased anxiety she was returned to bed. Increased fatigue noted by Daughter this evening. Patient is currently presenting with increased need for physical assistance to complete daily tasks and functional transfers due to recent fall resulting in a right pubic fracture. Patient will benefit from skilled OT services to increase functional performance during daily tasks and allow her to return home safely with Son. Recommend SNF at discharge.     Follow Up Recommendations  SNF    Equipment Recommendations  Other (comment)(TBD)       Precautions / Restrictions Precautions Precautions: Fall Restrictions Weight Bearing Restrictions: Yes RLE Weight Bearing: Weight bearing as tolerated LLE Weight Bearing: Weight bearing as tolerated      Mobility Bed Mobility Overal bed mobility: Needs Assistance Bed Mobility: Supine to Sit;Sit to Supine     Supine to sit: Mod assist;HOB elevated Sit to supine: Max assist      Transfers Overall transfer level: Needs assistance Equipment used: Standard walker Transfers: Sit to/from Stand         General transfer comment: Attempted to transfer to [place] commode. Patient was able to stand with RW. Required VC for walker use. Unable to take steps to turn and sit. Transfer was terminated.     Balance Overall balance assessment: History of Falls;Needs assistance Sitting-balance support: Feet supported;Bilateral upper extremity supported Sitting balance-Leahy  Scale: Fair     Standing balance support: Bilateral upper extremity supported Standing balance-Leahy Scale: Zero Standing balance comment: Patient with increased fatigue per Daughter's report from not sleeping well last night.                            ADL either performed or assessed with clinical judgement   ADL Overall ADL's : Needs assistance/impaired     Grooming: Wash/dry face;Set up;Bed level   Upper Body Bathing: Set up;Bed level           Lower Body Dressing: Total assistance;Bed level     Toilet Transfer Details (indicate cue type and reason): Attempted to transfer to the commode although unable take steps with RW. Then attempted to complete a stand pivot transfer and patient resisted any movement and transfer was terminated.                  Vision Baseline Vision/History: No visual deficits Patient Visual Report: No change from baseline              Pertinent Vitals/Pain Pain Assessment: Faces Faces Pain Scale: Hurts little more Pain Location: Pelvis Pain Intervention(s): Limited activity within patient's tolerance;Monitored during session     Hand Dominance Right   Extremity/Trunk Assessment Upper Extremity Assessment Upper Extremity Assessment: Overall WFL for tasks assessed   Lower Extremity Assessment Lower Extremity Assessment: Defer to PT evaluation       Communication Communication Communication: HOH   Cognition Arousal/Alertness: Awake/alert Behavior During Therapy: Anxious Overall Cognitive Status: Impaired/Different from baseline Area of Impairment: Orientation;Memory;Problem solving;Following commands;Awareness;Safety/judgement     Orientation  Level: Disoriented to;Situation   Memory: Decreased short-term memory Following Commands: Follows one step commands inconsistently     Problem Solving: Slow processing;Difficulty sequencing;Requires verbal cues;Requires tactile cues;Decreased initiation                 Home Living Family/patient expects to be discharged to:: Private residence Living Arrangements: Children(lives with son who works during the day) Available Help at Discharge: Available PRN/intermittently;Family(daughter lives out of town but comes to assist with showers) Type of Home: House Home Access: Stairs to enter [employer] of Steps: 3   Home Layout: One level     Bathroom Shower/Tub: Occupational psychologist: [place]: Environmental consultant - 4 wheels;Cane - single point          Prior Functioning/Environment Level of Independence: Independent with assistive device(s)        Comments: walks with cane, Able to dress herself. Some small cooking with supervision. Son does the homemaking and driving. Daughter comes over to help with showers.        OT Problem List: Decreased strength;Decreased knowledge of use of DME or AE;Decreased activity tolerance;Pain;Impaired balance (sitting and/or standing);Decreased safety awareness      OT Treatment/Interventions: Self-care/ADL training;Balance training;Therapeutic exercise;Therapeutic activities;Neuromuscular education;Energy conservation;DME and/or AE instruction;Manual therapy;Patient/family education    OT Goals(Current goals can be found in the care plan section) Acute Rehab OT Goals Patient Stated Goal: return home OT Goal Formulation: With family Time For Goal Achievement: 03/08/19 Potential to Achieve Goals: Fair  OT Frequency: Min 3X/week    AM-PAC OT "6 Clicks" Daily Activity     Outcome Measure Help from another person eating meals?: A Little Help from another person taking care of personal grooming?: A Little Help from another person toileting, which includes using toliet, bedpan, or urinal?: Total Help from another person bathing (including washing, rinsing, drying)?: Total Help from another person to put on and taking off regular upper body clothing?: Total Help from another  person to put on and taking off regular lower body clothing?: Total 6 Click Score: 10   End of Session Equipment Utilized During Treatment: Gait belt;Rolling walker  Activity Tolerance: Patient limited by fatigue;Patient limited by pain Patient left: in bed;with call bell/phone within reach;with family/visitor present(Daughter: [name])  OT Visit Diagnosis: Muscle weakness (generalized) (M62.81);History of falling (Z91.81)                Time: 4098-1191 OT Time Calculation (min): 29 min Charges:  OT Evaluation $OT Eval High Complexity: 1 High OT Treatments $Self Care/Home Management : 8-22 mins(22')  Ailene Ravel, OTR/L,CBIS  [phone]   Dessiree Sze, Clarene Duke 02/22/2019, 6:34 PM

## 2019-02-22 NOTE — H&P (Signed)
History and Physical    Erica Carroll TDS:[id number] DOB: 06-May-1922 DOA: 02/21/2019  PCP: Unk Pinto, MD  Patient coming from: Home.  Chief Complaint: Fall.  HPI: Erica Carroll is a 85 y.o. female with history of atrial fibrillation on metoprolol and Coumadin, sinus bradycardia status post pacemaker placement, chronic kidney disease stage III, chronic anemia mild dementia had a fall at home while walking.  As per the daughter patient likely tripped on something in the hall and fell.  Denies losing consciousness denies any chest pain nausea vomiting or diarrhea.  Has been having pain in the right hip pelvic area.  ED Course: X-rays in the ER shows a right possible acute fractures of the right superior and inferior pubic rami.  CT head and C-spine were unremarkable.  EKG shows normal sinus rhythm with T wave changes which are comparable to the old EKG.  Labs show anemia and mild hyponatremia and creatinine elevated at baseline.  Patient has significant pain for which patient was given fentanyl and admitted for further observation.  Review of Systems: As per HPI, rest all negative.   Past Medical History:  Diagnosis Date  . A-fib ([place])    a. Dx 06/2013;  b. 06/2013 Echo:  EF 55-60%, no reg wma, mild AI, mod dil LA.  Marland Kitchen Allergy   . GERD (gastroesophageal reflux disease)   . Hyperlipidemia   . RA (rheumatoid arthritis) ([place])    " in my hands "  . Sinus bradycardia 07/11/2013  . TIA (transient ischemic attack)    a. with fall 05/2013 -> neg head CT and MRI/MRA;  b. Plavix started.  . Urinary incontinence   . Vitamin D deficiency     Past Surgical History:  Procedure Laterality Date  . APPENDECTOMY  1936  . EP IMPLANTABLE DEVICE N/A 02/12/2015   Procedure: Pacemaker Implant;  Surgeon: Deboraha Sprang, MD;  Location: [facility];  Service: Cardiovascular;  Laterality: N/A;  . EYE SURGERY Right 1994   CE/IOL  . EYE SURGERY Left 1998   CE/IOL     reports that she has  never smoked. She has never used smokeless tobacco. She reports current alcohol use. She reports that she does not use drugs.  Allergies  Allergen Reactions  . Other Other (See Comments)    News Print: (freshly printed newspapers) cause her eyes to water . Allergy type reaction. Medal: itching & rash    Family History  Problem Relation Age of Onset  . Heart disease Mother   . Heart disease Sister   . Heart disease Brother   . Heart disease Brother   . CVA Sister   . Thyroid disease Sister     Prior to Admission medications   Medication Sig Start Date End Date Taking? Authorizing Provider  acetaminophen (TYLENOL) 325 MG tablet Take 325 mg by mouth every 6 (six) hours as needed for mild pain.     [provider]  b complex vitamins tablet Take 1 tablet by mouth daily.    [provider]  Cholecalciferol (VITAMIN D PO) Take 3,000 Units by mouth daily.     [provider]  diltiazem (CARDIZEM) 30 MG tablet Take 1 tablet (30 mg total) by mouth daily as needed. For Afib episodes 09/01/16   Deboraha Sprang, MD  GLUCOSAMINE-CHONDROITIN PO Take 1 tablet by mouth daily.    [provider]  MAGNESIUM OXIDE, ANTACID, PO Take 1 tablet by mouth daily.    [provider]  metoprolol tartrate (LOPRESSOR) 25 MG tablet Take 3 tablets (75 mg) by mouth twice daily. Please schedule an appt for further refills 01/11/19   Deboraha Sprang, MD  OVER THE COUNTER MEDICATION Take 1 tablet by mouth daily. Preserve vision vitamin    [provider]  polyethylene glycol (MIRALAX / GLYCOLAX) packet Take 17 g by mouth daily as needed (constipation).    [provider]  warfarin (COUMADIN) 5 MG tablet Take 1/2 to 1 tablet daily as directed by coumadin clinic. 02/06/19   Deboraha Sprang, MD    Physical Exam: Vitals:   02/21/19 2217 02/21/19 2245 02/21/19 2345 02/21/19 2347  BP:  (!) 151/74  (!) 157/67  Pulse:  69 97 70  Resp:  (!) 27 13 18   Temp: 98.1  F (36.7 C)     TempSrc: Oral     SpO2:  98% 98% 100%      Constitutional: Moderately built and nourished. Vitals:   02/21/19 2217 02/21/19 2245 02/21/19 2345 02/21/19 2347  BP:  (!) 151/74  (!) 157/67  Pulse:  69 97 70  Resp:  (!) 27 13 18   Temp: 98.1 F (36.7 C)     TempSrc: Oral     SpO2:  98% 98% 100%   Eyes: Anicteric no pallor. ENMT: No discharge from the ears eyes nose and mouth. Neck: No mass felt.  No neck rigidity. Respiratory: No rhonchi or crepitations. Cardiovascular: S1-S2 heard. Abdomen: Soft nontender bowel sounds present. Musculoskeletal: Pain on moving right hip. Skin: Mild ecchymosis in the right hip area. Neurologic: Alert awake oriented to her name and place moves all extremities.  Hard of hearing. Psychiatric: Appears normal.   Labs on Admission: I have personally reviewed following labs and imaging studies  CBC: No results for input(s): WBC, NEUTROABS, HGB, HCT, MCV, PLT in the last 168 hours. Basic Metabolic Panel: No results for input(s): NA, K, CL, CO2, GLUCOSE, BUN, CREATININE, CALCIUM, MG, PHOS in the last 168 hours. GFR: CrCl cannot be calculated (Patient's most recent lab result is older than the maximum 21 days allowed.). Liver Function Tests: No results for input(s): AST, ALT, ALKPHOS, BILITOT, PROT, ALBUMIN in the last 168 hours. No results for input(s): LIPASE, AMYLASE in the last 168 hours. No results for input(s): AMMONIA in the last 168 hours. Coagulation Profile: No results for input(s): INR, PROTIME in the last 168 hours. Cardiac Enzymes: No results for input(s): CKTOTAL, CKMB, CKMBINDEX, TROPONINI in the last 168 hours. BNP (last 3 results) No results for input(s): PROBNP in the last 8760 hours. HbA1C: No results for input(s): HGBA1C in the last 72 hours. CBG: No results for input(s): GLUCAP in the last 168 hours. Lipid Profile: No results for input(s): CHOL, HDL, LDLCALC, TRIG, CHOLHDL, LDLDIRECT in the last 72 hours.  Thyroid Function Tests: No results for input(s): TSH, T4TOTAL, FREET4, T3FREE, THYROIDAB in the last 72 hours. Anemia Panel: No results for input(s): VITAMINB12, FOLATE, FERRITIN, TIBC, IRON, RETICCTPCT in the last 72 hours. Urine analysis:    Component Value Date/Time   COLORURINE YELLOW 03/28/2018 1600   APPEARANCEUR CLEAR 03/28/2018 1600   LABSPEC 1.011 03/28/2018 1600   PHURINE 7.5 03/28/2018 1600   GLUCOSEU NEGATIVE 03/28/2018 1600   HGBUR NEGATIVE 03/28/2018 1600   BILIRUBINUR NEGATIVE 03/02/2017 1522   KETONESUR NEGATIVE 03/28/2018 1600   PROTEINUR NEGATIVE 03/28/2018 1600   UROBILINOGEN 0.2 06/25/2013 1940   NITRITE NEGATIVE 03/28/2018 1600   LEUKOCYTESUR 2+ (A) 03/28/2018 1600  Sepsis Labs: @LABRCNTIP (procalcitonin:4,lacticidven:4) )No results found for this or any previous visit (from the past 240 hour(s)).   Radiological Exams on Admission: Dg Lumbar Spine Complete  Result Date: 02/21/2019 CLINICAL DATA:  Status post fall, back pain EXAM: LUMBAR SPINE - COMPLETE 4+ VIEW COMPARISON:  None. FINDINGS: There are 5 nonrib bearing lumbar-type vertebral bodies. There is generalized osteopenia. The vertebral body heights are maintained. There is grade 1 anterolisthesis of L4 on L5 and L5 on S1. There is 3 mm retrolisthesis of L3 on L4. There is no spondylolysis. There is no acute fracture. There is degenerative disc disease with disc height loss at L3-4, L4-5 and L5-S1. There is degenerative disc disease with disc height loss at L1-2. The SI joints are unremarkable. IMPRESSION: No acute osseous injury of the lumbar spine. Electronically Signed   By: Kathreen Devoid   On: 02/21/2019 23:36   Ct Head Wo Contrast  Result Date: 02/21/2019 CLINICAL DATA:  Recent fall while on blood thinners EXAM: CT HEAD WITHOUT CONTRAST CT CERVICAL SPINE WITHOUT CONTRAST TECHNIQUE: Multidetector CT imaging of the head and cervical spine was performed following the standard protocol without intravenous  contrast. Multiplanar CT image reconstructions of the cervical spine were also generated. COMPARISON:  01/28/2015 FINDINGS: CT HEAD FINDINGS Brain: No evidence of acute infarction, hemorrhage, hydrocephalus, extra-axial collection or mass lesion/mass effect. Mild atrophic changes are noted. Vascular: No hyperdense vessel or unexpected calcification. Skull: Normal. Negative for fracture or focal lesion. Sinuses/Orbits: No acute finding. Other: None. CT CERVICAL SPINE FINDINGS Alignment: Within normal limits. Skull base and vertebrae: 7 cervical segments are well visualized. Vertebral body height is well maintained. Facet hypertrophic changes are noted at multiple levels. No acute fracture or acute facet abnormality is noted. Soft tissues and spinal canal: Surrounding soft tissue structures are within normal limits. Upper chest: Visualized lung apices are unremarkable. Other: None IMPRESSION: CT of the head: Atrophic changes without acute abnormality. CT of cervical spine: Multilevel degenerative change without acute abnormality. Electronically Signed   By: Inez Catalina M.D.   On: 02/21/2019 23:11   Ct Cervical Spine Wo Contrast  Result Date: 02/21/2019 CLINICAL DATA:  Recent fall while on blood thinners EXAM: CT HEAD WITHOUT CONTRAST CT CERVICAL SPINE WITHOUT CONTRAST TECHNIQUE: Multidetector CT imaging of the head and cervical spine was performed following the standard protocol without intravenous contrast. Multiplanar CT image reconstructions of the cervical spine were also generated. COMPARISON:  01/28/2015 FINDINGS: CT HEAD FINDINGS Brain: No evidence of acute infarction, hemorrhage, hydrocephalus, extra-axial collection or mass lesion/mass effect. Mild atrophic changes are noted. Vascular: No hyperdense vessel or unexpected calcification. Skull: Normal. Negative for fracture or focal lesion. Sinuses/Orbits: No acute finding. Other: None. CT CERVICAL SPINE FINDINGS Alignment: Within normal limits. Skull base  and vertebrae: 7 cervical segments are well visualized. Vertebral body height is well maintained. Facet hypertrophic changes are noted at multiple levels. No acute fracture or acute facet abnormality is noted. Soft tissues and spinal canal: Surrounding soft tissue structures are within normal limits. Upper chest: Visualized lung apices are unremarkable. Other: None IMPRESSION: CT of the head: Atrophic changes without acute abnormality. CT of cervical spine: Multilevel degenerative change without acute abnormality. Electronically Signed   By: Inez Catalina M.D.   On: 02/21/2019 23:11   Dg Pelvis Portable  Result Date: 02/21/2019 CLINICAL DATA:  Fall, question hip fracture EXAM: PORTABLE PELVIS 1-2 VIEWS COMPARISON:  None. FINDINGS: Coarse calcifications in the pelvis. Pubic symphysis is intact. Both femoral heads project in joint.  There are mild degenerative changes. Possible fracture lucencies at the right superior and inferior pubic rami. IMPRESSION: Possible acute fractures involving the right superior and inferior pubic rami. Electronically Signed   By: Donavan Foil M.D.   On: 02/21/2019 23:01   Dg Chest Port 1 View  Result Date: 02/21/2019 CLINICAL DATA:  Fall EXAM: PORTABLE CHEST 1 VIEW COMPARISON:  04/25/2015 FINDINGS: Left-sided pacing device with leads over right atrium and right ventricle. Mild cardiomegaly with aortic atherosclerosis. No acute opacity, pleural effusion or pneumothorax. Old right-sided rib fractures. IMPRESSION: No active disease.  Mild cardiomegaly Electronically Signed   By: Donavan Foil M.D.   On: 02/21/2019 22:59    EKG: Independently reviewed.  Normal sinus rhythm with T wave inversions comparable to the old EKG.  Assessment/Plan Principal Problem:   Pelvic fracture (HCC) Active Problems:   Paroxysmal atrial fibrillation (HCC)   CKD stage G3b/A1, GFR 30-44 and albumin creatinine ratio <30 mg/g (HCC)   Mild dementia ([place])    1. Acute fracture involving the right  superior and inferior pubic rami status post mechanical fall -keep patient on fentanyl pain relief medication physical therapy consult.  May discuss with orthopedics in the morning. 2. A. fib on metoprolol and Coumadin.  Coumadin to be dosed per pharmacy. 3. Chronic kidney disease stage III creatinine appears to be at baseline. 4. Chronic anemia likely from renal disease appears to be at baseline.  Follow CBC. 5. Mild hyponatremia follow metabolic panel. 6. Platelet counts were not able to be seen in the first CBC I have ordered a repeat CBC. 7. Status post pacemaker placement for sinus bradycardia.  Interrogation was done and results are pending.   DVT prophylaxis: Coumadin. Code Status: DNR as confirmed with patient's daughter. Family Communication: Patient's daughter is at the bedside. Disposition Plan: To be determined. Consults called: Physical therapy. Admission status: Observation.   Rise Patience MD [facility] Pager [phone].  If 7PM-7AM, please contact night-coverage [email or web address] Password [email or web address]  02/22/2019, 12:12 AM

## 2019-02-23 LAB — URINALYSIS, ROUTINE W REFLEX MICROSCOPIC
Bacteria, UA: NONE SEEN
Bilirubin Urine: NEGATIVE
Glucose, UA: NEGATIVE mg/dL
Ketones, ur: NEGATIVE mg/dL
Nitrite: NEGATIVE
Protein, ur: NEGATIVE mg/dL
Specific Gravity, Urine: 1.012 (ref 1.005–1.030)
pH: 6 (ref 5.0–8.0)

## 2019-02-23 LAB — CBC
HCT: 32.8 % — ABNORMAL LOW (ref 36.0–46.0)
Hemoglobin: 11.1 g/dL — ABNORMAL LOW (ref 12.0–15.0)
MCH: 30.8 pg (ref 26.0–34.0)
MCHC: 33.8 g/dL (ref 30.0–36.0)
MCV: 91.1 fL (ref 80.0–100.0)
Platelets: 123 10*3/uL — ABNORMAL LOW (ref 150–400)
RBC: 3.6 MIL/uL — ABNORMAL LOW (ref 3.87–5.11)
RDW: 12.4 % (ref 11.5–15.5)
WBC: 9.2 10*3/uL (ref 4.0–10.5)
nRBC: 0 % (ref 0.0–0.2)

## 2019-02-23 LAB — PROTIME-INR
INR: 1.6 — ABNORMAL HIGH (ref 0.8–1.2)
Prothrombin Time: 18.7 seconds — ABNORMAL HIGH (ref 11.4–15.2)

## 2019-02-23 MED ORDER — WARFARIN SODIUM 3 MG PO TABS
6.0000 mg | ORAL_TABLET | Freq: Once | ORAL | Status: AC
  Administered 2019-02-23: 6 mg via ORAL
  Filled 2019-02-23 (×2): qty 2

## 2019-02-23 NOTE — Progress Notes (Signed)
PROGRESS NOTE    Erica Carroll  UKG:[id number] DOB: 1921/11/19 DOA: 02/21/2019 PCP: Unk Pinto, MD   Brief Narrative:  Erica Carroll a 85 y.o.femalewithhistory of atrial fibrillation on metoprolol and Coumadin, sinus bradycardia status post pacemaker placement, chronic kidney disease stage III, chronic anemia mild dementia had a fall at home while walking. As per the daughter patient likely tripped on something in the hall and fell. Denies losing consciousness denies any chest pain nausea vomiting or diarrhea. Has been having pain in the right hip pelvic area.  ED Course:X-rays in the ER shows a right possible acute fractures of the right superior and inferior pubic rami. CT head and C-spine were unremarkable. EKG shows normal sinus rhythm with T wave changes which are comparableto the old EKG. Labs show anemia and mild hyponatremia and creatinine elevated at baseline. Patient has significant pain for which patient was given fentanyl and admitted for further observation.   Assessment & Plan:   Principal Problem:   Pelvic fracture (HCC) Active Problems:   Paroxysmal atrial fibrillation (HCC)   CKD stage G3b/A1, GFR 30-44 and albumin creatinine ratio <30 mg/g (HCC)   Mild dementia (HCC)   Bilateral fracture of pubic rami (HCC)   1. Acute fracture involving the right superior and inferior pubic rami status post mechanical fall-continue analgesics physical therapy consult showed patient was unable to ambulate, needs 2 person assist to stand and pivot.  Patient unsafe to discharge home high fall risk with worsening injury at rest.   Seen by orthopedics nonoperative at this point.    Currently being evaluated for skilled nursing facility versus home health with PT.   2. A. fib on metoprolol and Coumadin.   Continue nodal blocking agents, Coumadin to be dosed per pharmacy. 3. Chronic kidney disease stage III creatinine appears to be at baseline.  Will monitor  electrolytes 4. Chronic anemia likely from renal disease appears to be at baseline. Follow CBC. 5. Mild hyponatremia follow metabolic panel.. 6. Status post pacemaker placement for sinus bradycardia. Interrogation was done and reported to be normal no evidence of arrhythmia  DVT prophylaxis: JS:[id number]  Code Status: dnr    Code Status Orders  (From admission, onward)         Start     Ordered   02/22/19 0011  Do not attempt resuscitation (DNR)  Continuous    Question Answer Comment  In the event of cardiac or respiratory ARREST Do not call a code blue   In the event of cardiac or respiratory ARREST Do not perform Intubation, CPR, defibrillation or ACLS   In the event of cardiac or respiratory ARREST Use medication by any route, position, wound care, and other measures to relive pain and suffering. May use oxygen, suction and manual treatment of airway obstruction as needed for comfort.      02/22/19 0011        Code Status History    Date Active Date Inactive Code Status Order ID Comments User Context   02/22/2019 0007 02/22/2019 0011 DNR [id number]  Rise Patience, MD ED   02/12/2015 1702 02/13/2015 1846 Full Code [id number]  Deboraha Sprang, MD Inpatient   02/12/2015 1343 02/12/2015 1702 Full Code [id number]  Melina Schools, NP Inpatient   01/28/2015 1801 01/29/2015 1817 Full Code [id number]  Sueanne Margarita, MD Inpatient   07/10/2013 0945 07/14/2013 1420 Full Code [id number]  Oswald Hillock, MD Inpatient   06/25/2013 2320 06/27/2013 2055 Full Code [id number]  Orvan Falconer,  MD Inpatient     Family Communication: Discussed with daughter by cell phone Disposition Plan:    Patient was converted to inpatient because of severity illness patient is unable to ambulate safely.  She requires 2 person assist for stand and pivot only, she is unable perform any additional physical therapy activities.  Because of this patient high risk of falls with additional or worsening injuries. Consults  called: rehab Admission status: Inpatient   Consultants:   As above  Procedures:  Dg Lumbar Spine Complete  Result Date: 02/21/2019 CLINICAL DATA:  Status post fall, back pain EXAM: LUMBAR SPINE - COMPLETE 4+ VIEW COMPARISON:  None. FINDINGS: There are 5 nonrib bearing lumbar-type vertebral bodies. There is generalized osteopenia. The vertebral body heights are maintained. There is grade 1 anterolisthesis of L4 on L5 and L5 on S1. There is 3 mm retrolisthesis of L3 on L4. There is no spondylolysis. There is no acute fracture. There is degenerative disc disease with disc height loss at L3-4, L4-5 and L5-S1. There is degenerative disc disease with disc height loss at L1-2. The SI joints are unremarkable. IMPRESSION: No acute osseous injury of the lumbar spine. Electronically Signed   By: Kathreen Devoid   On: 02/21/2019 23:36   Ct Head Wo Contrast  Result Date: 02/21/2019 CLINICAL DATA:  Recent fall while on blood thinners EXAM: CT HEAD WITHOUT CONTRAST CT CERVICAL SPINE WITHOUT CONTRAST TECHNIQUE: Multidetector CT imaging of the head and cervical spine was performed following the standard protocol without intravenous contrast. Multiplanar CT image reconstructions of the cervical spine were also generated. COMPARISON:  01/28/2015 FINDINGS: CT HEAD FINDINGS Brain: No evidence of acute infarction, hemorrhage, hydrocephalus, extra-axial collection or mass lesion/mass effect. Mild atrophic changes are noted. Vascular: No hyperdense vessel or unexpected calcification. Skull: Normal. Negative for fracture or focal lesion. Sinuses/Orbits: No acute finding. Other: None. CT CERVICAL SPINE FINDINGS Alignment: Within normal limits. Skull base and vertebrae: 7 cervical segments are well visualized. Vertebral body height is well maintained. Facet hypertrophic changes are noted at multiple levels. No acute fracture or acute facet abnormality is noted. Soft tissues and spinal canal: Surrounding soft tissue structures are  within normal limits. Upper chest: Visualized lung apices are unremarkable. Other: None IMPRESSION: CT of the head: Atrophic changes without acute abnormality. CT of cervical spine: Multilevel degenerative change without acute abnormality. Electronically Signed   By: Inez Catalina M.D.   On: 02/21/2019 23:11   Ct Cervical Spine Wo Contrast  Result Date: 02/21/2019 CLINICAL DATA:  Recent fall while on blood thinners EXAM: CT HEAD WITHOUT CONTRAST CT CERVICAL SPINE WITHOUT CONTRAST TECHNIQUE: Multidetector CT imaging of the head and cervical spine was performed following the standard protocol without intravenous contrast. Multiplanar CT image reconstructions of the cervical spine were also generated. COMPARISON:  01/28/2015 FINDINGS: CT HEAD FINDINGS Brain: No evidence of acute infarction, hemorrhage, hydrocephalus, extra-axial collection or mass lesion/mass effect. Mild atrophic changes are noted. Vascular: No hyperdense vessel or unexpected calcification. Skull: Normal. Negative for fracture or focal lesion. Sinuses/Orbits: No acute finding. Other: None. CT CERVICAL SPINE FINDINGS Alignment: Within normal limits. Skull base and vertebrae: 7 cervical segments are well visualized. Vertebral body height is well maintained. Facet hypertrophic changes are noted at multiple levels. No acute fracture or acute facet abnormality is noted. Soft tissues and spinal canal: Surrounding soft tissue structures are within normal limits. Upper chest: Visualized lung apices are unremarkable. Other: None IMPRESSION: CT of the head: Atrophic changes without acute abnormality. CT of cervical  spine: Multilevel degenerative change without acute abnormality. Electronically Signed   By: Inez Catalina M.D.   On: 02/21/2019 23:11   Dg Pelvis Portable  Result Date: 02/21/2019 CLINICAL DATA:  Fall, question hip fracture EXAM: PORTABLE PELVIS 1-2 VIEWS COMPARISON:  None. FINDINGS: Coarse calcifications in the pelvis. Pubic symphysis is  intact. Both femoral heads project in joint. There are mild degenerative changes. Possible fracture lucencies at the right superior and inferior pubic rami. IMPRESSION: Possible acute fractures involving the right superior and inferior pubic rami. Electronically Signed   By: Donavan Foil M.D.   On: 02/21/2019 23:01   Dg Pelvis Comp Min 3v  Result Date: 02/22/2019 CLINICAL DATA:  Pelvic fracture EXAM: JUDET PELVIS - 3+ VIEW COMPARISON:  the previous day's study FINDINGS: Minimally displaced fractures of the right superior ischial ramus and inferior ischiopubic ramus. No other pelvic ring fracture is identified. There is mild narrowing of articular cartilage in bilateral hips. No convincing femoral or acetabular fracture. No dislocation. Bilateral pelvic phleboliths. Mild spondylitic changes in the visualized lower lumbar spine. IMPRESSION: Minimally displaced right superior and inferior ischial ramus fractures. Electronically Signed   By: Lucrezia Europe M.D.   On: 02/22/2019 09:48   Dg Chest Port 1 View  Result Date: 02/21/2019 CLINICAL DATA:  Fall EXAM: PORTABLE CHEST 1 VIEW COMPARISON:  04/25/2015 FINDINGS: Left-sided pacing device with leads over right atrium and right ventricle. Mild cardiomegaly with aortic atherosclerosis. No acute opacity, pleural effusion or pneumothorax. Old right-sided rib fractures. IMPRESSION: No active disease.  Mild cardiomegaly Electronically Signed   By: Donavan Foil M.D.   On: 02/21/2019 22:59     Antimicrobials:   None   Subjective: Patient much more awake and conversant this morning. reported difficulty getting out of bed and pivoting secondary to pain  Objective: Vitals:   02/23/19 0035 02/23/19 0602 02/23/19 0915 02/23/19 1254  BP: (!) 154/67 (!) 144/81 (!) 139/57 (!) 121/57  Pulse: 67 78 77 63  Resp: 18 18  20   Temp: 98.2 F (36.8 C) 98.6 F (37 C) 98.2 F (36.8 C) 98.4 F (36.9 C)  TempSrc: Oral Oral Oral Oral  SpO2: 94% 94% 99% 93%  Weight:       Height:        Intake/Output Summary (Last 24 hours) at 02/23/2019 1454 Last data filed at 02/23/2019 1229 Gross per 24 hour  Intake 240 ml  Output 850 ml  Net -610 ml   Filed Weights   02/22/19 0647  Weight: 68 kg    Examination:  General exam: Appears calm and comfortable  Respiratory system: Clear to auscultation. Respiratory effort normal. Cardiovascular system: S1 & S2 heard, RRR. No JVD, murmurs, rubs, gallops or clicks. No pedal edema. Gastrointestinal system: Abdomen is nondistended, soft and nontender. No organomegaly or masses felt. Normal bowel sounds heard. Central nervous system: Alert and oriented. No focal neurological deficits.  Exam limited by pain Extremities: Well perfused, no contractures Skin: No rashes, lesions or ulcers Psychiatry: Judgement and insight appear normal. Mood & affect appropriate.  Significant improvement from prior exam    Data Reviewed: I have personally reviewed following labs and imaging studies  CBC: Recent Labs  Lab 02/22/19 0008 02/22/19 0436 02/22/19 0500 02/23/19 1034  WBC 12.5* 9.9   9.8 DUP SEE [id number] 9.2  NEUTROABS 10.5* 7.7 PENDING  --   HGB 11.6* 10.3*   10.2* DUP SEE [id number] 11.1*  HCT 34.0* 29.9*   29.4* DUP SEE H7204 32.8*  MCV 91.2  89.8   89.1 DUP SEE H7204 91.1  PLT PLATELET CLUMPS NOTED ON SMEAR, UNABLE TO ESTIMATE 131*   140* DUP SEE H7204 528*   Basic Metabolic Panel: Recent Labs  Lab 02/22/19 0008 02/22/19 0436  NA 132* 132*  K 4.5 4.3  CL 100 101  CO2 22 23  GLUCOSE 123* 127*  BUN 10 11  CREATININE 1.30* 1.21*  CALCIUM 8.7* 8.6*   GFR: Estimated Creatinine Clearance: 26.9 mL/min (A) (by C-G formula based on SCr of 1.21 mg/dL (H)). Liver Function Tests: Recent Labs  Lab 02/22/19 0008  AST 29  ALT 12  ALKPHOS 55  BILITOT 0.9  PROT 6.9  ALBUMIN 3.8   No results for input(s): LIPASE, AMYLASE in the last 168 hours. No results for input(s): AMMONIA in the last 168 hours. Coagulation  Profile: Recent Labs  Lab 02/22/19 0436 02/23/19 0800  INR 2.1* 1.6*   Cardiac Enzymes: No results for input(s): CKTOTAL, CKMB, CKMBINDEX, TROPONINI in the last 168 hours. BNP (last 3 results) No results for input(s): PROBNP in the last 8760 hours. HbA1C: No results for input(s): HGBA1C in the last 72 hours. CBG: No results for input(s): GLUCAP in the last 168 hours. Lipid Profile: No results for input(s): CHOL, HDL, LDLCALC, TRIG, CHOLHDL, LDLDIRECT in the last 72 hours. Thyroid Function Tests: No results for input(s): TSH, T4TOTAL, FREET4, T3FREE, THYROIDAB in the last 72 hours. Anemia Panel: No results for input(s): VITAMINB12, FOLATE, FERRITIN, TIBC, IRON, RETICCTPCT in the last 72 hours. Sepsis Labs: No results for input(s): PROCALCITON, LATICACIDVEN in the last 168 hours.  Recent Results (from the past 240 hour(s))  SARS Coronavirus 2 (CEPHEID - Performed in [facility] hospital lab), Hosp Order     Status: None   Collection Time: 02/22/19 12:14 AM  Result Value Ref Range Status   SARS Coronavirus 2 NEGATIVE NEGATIVE Final    Comment: (NOTE) If result is NEGATIVE SARS-CoV-2 target nucleic acids are NOT DETECTED. The SARS-CoV-2 RNA is generally detectable in upper and lower  respiratory specimens during the acute phase of infection. The lowest  concentration of SARS-CoV-2 viral copies this assay can detect is 250  copies / mL. A negative result does not preclude SARS-CoV-2 infection  and should not be used as the sole basis for treatment or other  patient management decisions.  A negative result may occur with  improper specimen collection / handling, submission of specimen other  than nasopharyngeal swab, presence of viral mutation(s) within the  areas targeted by this assay, and inadequate number of viral copies  (<250 copies / mL). A negative result must be combined with clinical  observations, patient history, and epidemiological information. If result is  POSITIVE SARS-CoV-2 target nucleic acids are DETECTED. The SARS-CoV-2 RNA is generally detectable in upper and lower  respiratory specimens dur ing the acute phase of infection.  Positive  results are indicative of active infection with SARS-CoV-2.  Clinical  correlation with patient history and other diagnostic information is  necessary to determine patient infection status.  Positive results do  not rule out bacterial infection or co-infection with other viruses. If result is PRESUMPTIVE POSTIVE SARS-CoV-2 nucleic acids MAY BE PRESENT.   A presumptive positive result was obtained on the submitted specimen  and confirmed on repeat testing.  While 2019 novel coronavirus  (SARS-CoV-2) nucleic acids may be present in the submitted sample  additional confirmatory testing may be necessary for epidemiological  and / or clinical management purposes  to differentiate between  SARS-CoV-2 and other Sarbecovirus currently known to infect humans.  If clinically indicated additional testing with an alternate test  methodology [phone]) is advised. The SARS-CoV-2 RNA is generally  detectable in upper and lower respiratory sp ecimens during the acute  phase of infection. The expected result is Negative. Fact Sheet for Patients:  StrictlyIdeas.no Fact Sheet for Healthcare Providers: BankingDealers.co.za This test is not yet approved or cleared by the [place] FDA and has been authorized for detection and/or diagnosis of SARS-CoV-2 by FDA under an Emergency Use Authorization (EUA).  This EUA will remain in effect (meaning this test can be used) for the duration of the COVID-19 declaration under Section 564(b)(1) of the Act, 21 U.S.C. section 360bbb-3(b)(1), unless the authorization is terminated or revoked sooner. Performed at [facility], [place] [address], [place], [place] [address]          Radiology Studies: Dg Lumbar Spine  Complete  Result Date: 02/21/2019 CLINICAL DATA:  Status post fall, back pain EXAM: LUMBAR SPINE - COMPLETE 4+ VIEW COMPARISON:  None. FINDINGS: There are 5 nonrib bearing lumbar-type vertebral bodies. There is generalized osteopenia. The vertebral body heights are maintained. There is grade 1 anterolisthesis of L4 on L5 and L5 on S1. There is 3 mm retrolisthesis of L3 on L4. There is no spondylolysis. There is no acute fracture. There is degenerative disc disease with disc height loss at L3-4, L4-5 and L5-S1. There is degenerative disc disease with disc height loss at L1-2. The SI joints are unremarkable. IMPRESSION: No acute osseous injury of the lumbar spine. Electronically Signed   By: Kathreen Devoid   On: 02/21/2019 23:36   Ct Head Wo Contrast  Result Date: 02/21/2019 CLINICAL DATA:  Recent fall while on blood thinners EXAM: CT HEAD WITHOUT CONTRAST CT CERVICAL SPINE WITHOUT CONTRAST TECHNIQUE: Multidetector CT imaging of the head and cervical spine was performed following the standard protocol without intravenous contrast. Multiplanar CT image reconstructions of the cervical spine were also generated. COMPARISON:  01/28/2015 FINDINGS: CT HEAD FINDINGS Brain: No evidence of acute infarction, hemorrhage, hydrocephalus, extra-axial collection or mass lesion/mass effect. Mild atrophic changes are noted. Vascular: No hyperdense vessel or unexpected calcification. Skull: Normal. Negative for fracture or focal lesion. Sinuses/Orbits: No acute finding. Other: None. CT CERVICAL SPINE FINDINGS Alignment: Within normal limits. Skull base and vertebrae: 7 cervical segments are well visualized. Vertebral body height is well maintained. Facet hypertrophic changes are noted at multiple levels. No acute fracture or acute facet abnormality is noted. Soft tissues and spinal canal: Surrounding soft tissue structures are within normal limits. Upper chest: Visualized lung apices are unremarkable. Other: None IMPRESSION: CT of  the head: Atrophic changes without acute abnormality. CT of cervical spine: Multilevel degenerative change without acute abnormality. Electronically Signed   By: Inez Catalina M.D.   On: 02/21/2019 23:11   Ct Cervical Spine Wo Contrast  Result Date: 02/21/2019 CLINICAL DATA:  Recent fall while on blood thinners EXAM: CT HEAD WITHOUT CONTRAST CT CERVICAL SPINE WITHOUT CONTRAST TECHNIQUE: Multidetector CT imaging of the head and cervical spine was performed following the standard protocol without intravenous contrast. Multiplanar CT image reconstructions of the cervical spine were also generated. COMPARISON:  01/28/2015 FINDINGS: CT HEAD FINDINGS Brain: No evidence of acute infarction, hemorrhage, hydrocephalus, extra-axial collection or mass lesion/mass effect. Mild atrophic changes are noted. Vascular: No hyperdense vessel or unexpected calcification. Skull: Normal. Negative for fracture or focal lesion. Sinuses/Orbits: No acute finding. Other: None. CT CERVICAL SPINE FINDINGS Alignment:  Within normal limits. Skull base and vertebrae: 7 cervical segments are well visualized. Vertebral body height is well maintained. Facet hypertrophic changes are noted at multiple levels. No acute fracture or acute facet abnormality is noted. Soft tissues and spinal canal: Surrounding soft tissue structures are within normal limits. Upper chest: Visualized lung apices are unremarkable. Other: None IMPRESSION: CT of the head: Atrophic changes without acute abnormality. CT of cervical spine: Multilevel degenerative change without acute abnormality. Electronically Signed   By: Inez Catalina M.D.   On: 02/21/2019 23:11   Dg Pelvis Portable  Result Date: 02/21/2019 CLINICAL DATA:  Fall, question hip fracture EXAM: PORTABLE PELVIS 1-2 VIEWS COMPARISON:  None. FINDINGS: Coarse calcifications in the pelvis. Pubic symphysis is intact. Both femoral heads project in joint. There are mild degenerative changes. Possible fracture lucencies  at the right superior and inferior pubic rami. IMPRESSION: Possible acute fractures involving the right superior and inferior pubic rami. Electronically Signed   By: Donavan Foil M.D.   On: 02/21/2019 23:01   Dg Pelvis Comp Min 3v  Result Date: 02/22/2019 CLINICAL DATA:  Pelvic fracture EXAM: JUDET PELVIS - 3+ VIEW COMPARISON:  the previous day's study FINDINGS: Minimally displaced fractures of the right superior ischial ramus and inferior ischiopubic ramus. No other pelvic ring fracture is identified. There is mild narrowing of articular cartilage in bilateral hips. No convincing femoral or acetabular fracture. No dislocation. Bilateral pelvic phleboliths. Mild spondylitic changes in the visualized lower lumbar spine. IMPRESSION: Minimally displaced right superior and inferior ischial ramus fractures. Electronically Signed   By: Lucrezia Europe M.D.   On: 02/22/2019 09:48   Dg Chest Port 1 View  Result Date: 02/21/2019 CLINICAL DATA:  Fall EXAM: PORTABLE CHEST 1 VIEW COMPARISON:  04/25/2015 FINDINGS: Left-sided pacing device with leads over right atrium and right ventricle. Mild cardiomegaly with aortic atherosclerosis. No acute opacity, pleural effusion or pneumothorax. Old right-sided rib fractures. IMPRESSION: No active disease.  Mild cardiomegaly Electronically Signed   By: Donavan Foil M.D.   On: 02/21/2019 22:59        Scheduled Meds:  metoprolol tartrate  75 mg Oral BID   warfarin  6 mg Oral ONCE-1800   Warfarin - Pharmacist Dosing Inpatient   Does not apply q1800   Continuous Infusions:   LOS: 1 day    Time spent: 35 min    Nicolette Bang, MD [facility]  If 7PM-7AM, please contact night-coverage  02/23/2019, 2:54 PM

## 2019-02-23 NOTE — Progress Notes (Signed)
[place] for warfarin Indication: atrial fibrillation  Allergies  Allergen Reactions  . Other Other (See Comments)    News Print: (freshly printed newspapers) cause her eyes to water . Allergy type reaction. Medal: itching & rash    Vital Signs: Temp: 98.2 F (36.8 C) (05/29 0915) Temp Source: Oral (05/29 0915) BP: 139/57 (05/29 0915) Pulse Rate: 77 (05/29 0915)   Assessment: 85yo female presents after fall sustaining pubic rami fracture - no operative management. Pharmacy consulted to manage warfarin inpatient.  INR is subtherapeutic at 1.6. No bleeding noted, no CBC for today.  Home regimen: 5 mg daily except 2.5 mg Thur/Sun  Goal of Therapy:  INR 2-3   Plan:  Warfarin 6 mg PO tonight Daily INR Monitor for s/sx of bleeding Add on CBC to labs for today   Thank you for involving pharmacy in this patient's care.  Renold Genta, PharmD, BCPS Clinical Pharmacist Clinical phone for 02/23/2019 until 3p is [phone] 02/23/2019 10:11 AM  **Pharmacist phone directory can be found on [email or web address] listed under [place]**

## 2019-02-23 NOTE — Progress Notes (Signed)
Physical Therapy Treatment Patient Details Name: Erica Carroll MRN: [id number] DOB: 1922-06-06 Today's Date: 02/23/2019    History of Present Illness 85 yo s/p fall at home with Right pubic rami fx. PMHx: AFib, CKD, HOH, mild dementia, HTN    PT Comments    Patient seen for mobility progression. Attempted to perform bed mobility to simulate home environment, however patient with poor tolerance to rolling, therefore HOB elevated and use of bed pad to facilitate patient to EOB. Patient performing sit to stand and stand pivot to recliner with Mod A with use fo RW. Poor safety awareness with mobility requiring consistent cueing.  Of note, Patient does not qualify for CIR, with PT current recommendations for SNF due to patients current functional status and need for increased physical assist for all mobility. PT to continue to follow.     Follow Up Recommendations  CIR;SNF;Supervision/Assistance - 24 hour     Equipment Recommendations  Rolling walker with 5" wheels;3in1 (PT)    Recommendations for Other Services       Precautions / Restrictions Precautions Precautions: Fall Restrictions Weight Bearing Restrictions: Yes RLE Weight Bearing: Weight bearing as tolerated LLE Weight Bearing: Weight bearing as tolerated    Mobility  Bed Mobility Overal bed mobility: Needs Assistance Bed Mobility: Supine to Sit     Supine to sit: Mod assist;HOB elevated     General bed mobility comments: attempted to roll to sidelying and then sit, however pateint intolerable to this; required HOB elevated and Mod A to come to EOB  Transfers Overall transfer level: Needs assistance Equipment used: Rolling walker (2 wheeled) Transfers: Sit to/from [facility] Sit to Stand: Mod assist Stand pivot transfers: Mod assist       General transfer comment: Mod A to stand at bedside and pivot to recliner; patient with poor safety awareness and tends to reach for chair prematurely; able  to take step with R LE however poor weight shift to R LE for L LE clearance  Ambulation/Gait             General Gait Details: limited to pivots today due to pt's tolerance   Stairs             Wheelchair Mobility    Modified Rankin (Stroke Patients Only)       Balance Overall balance assessment: History of Falls;Needs assistance Sitting-balance support: Bilateral upper extremity supported;Feet supported Sitting balance-Leahy Scale: Fair     Standing balance support: Bilateral upper extremity supported;During functional activity Standing balance-Leahy Scale: Poor                              Cognition Arousal/Alertness: Awake/alert Behavior During Therapy: Restless Overall Cognitive Status: Impaired/Different from baseline Area of Impairment: Following commands;Safety/judgement;Problem solving                       Following Commands: Follows one step commands with increased time;Follows multi-step commands inconsistently Safety/Judgement: Decreased awareness of safety;Decreased awareness of deficits   Problem Solving: Slow processing;Difficulty sequencing;Requires verbal cues;Requires tactile cues;Decreased initiation General Comments: daughter stating this is not her usual      Exercises      General Comments        Pertinent Vitals/Pain Pain Assessment: Faces Faces Pain Scale: Hurts even more Pain Location: Pelvis Pain Descriptors / Indicators: Aching;Discomfort;Grimacing;Guarding Pain Intervention(s): Limited activity within patient's tolerance;Monitored during session;Repositioned    Home Living  Prior Function            PT Goals (current goals can now be found in the care plan section) Acute Rehab PT Goals Patient Stated Goal: return home PT Goal Formulation: With patient/family Time For Goal Achievement: 03/08/19 Potential to Achieve Goals: Good Progress towards PT goals: Progressing  toward goals    Frequency    Min 4X/week      PT Plan Current plan remains appropriate    Co-evaluation              AM-PAC PT "6 Clicks" Mobility   Outcome Measure  Help needed turning from your back to your side while in a flat bed without using bedrails?: A Lot Help needed moving from lying on your back to sitting on the side of a flat bed without using bedrails?: A Lot Help needed moving to and from a bed to a chair (including a wheelchair)?: A Lot Help needed standing up from a chair using your arms (e.g., wheelchair or bedside chair)?: A Lot Help needed to walk in hospital room?: A Lot Help needed climbing 3-5 steps with a railing? : Total 6 Click Score: 11    End of Session Equipment Utilized During Treatment: Gait belt Activity Tolerance: Patient tolerated treatment well Patient left: in chair;with call bell/phone within reach;with chair alarm set;with family/visitor present Nurse Communication: Mobility status PT Visit Diagnosis: Other abnormalities of gait and mobility (R26.89);Muscle weakness (generalized) (M62.81);Pain;History of falling (Z91.81) Pain - Right/Left: Right Pain - part of body: Hip     Time: 1610-9604 PT Time Calculation (min) (ACUTE ONLY): 28 min  Charges:  $Therapeutic Activity: 23-37 mins                     Lanney Gins, PT, DPT Supplemental Physical Therapist 02/23/19 1:49 PM Pager: [phone] Office: [phone]

## 2019-02-23 NOTE — TOC Progression Note (Addendum)
Transition of Care [facility]) - Progression Note    Patient Details  Name: STAPHANIE HARBISON MRN: [id number] Date of Birth: 1922/03/31  Transition of Care [facility]) CM/SW [place], LCSW Phone Number: 02/23/2019, 10:16 AM  Clinical Narrative: Received call from patient's daughter. She has not yet called CIR admissions coordinator but will do so this morning. Notified her that [facility] is full right now and [facility] does not take [employer]. Provided bed offers so far: [facility], [facility], [facility], and [facility]. Daughter also inquired about home health. Told her if we do go that route we would request max services. Patient's only income is social security so she will discuss with family to see what they can pull together. She is also agreeable to DME recommendations. Patient has a cane and shower chair at home. PT recommending rolling walker and 3-in-1.  12:16 pm: Received voicemail from daughter asking about [facility]. Sent referral and left voicemail for social worker asking her to review.  Expected Discharge Plan: Skilled Nursing Facility Barriers to Discharge: Ship broker, Continued Medical Work up  Expected Discharge Plan and Services Expected Discharge Plan: [facility] arrangements for the past 2 months: Single Family Home                                       Social Determinants of Health (SDOH) Interventions    Readmission Risk Interventions No flowsheet data found.

## 2019-02-23 NOTE — Progress Notes (Signed)
Inpatient Rehabilitation Admissions Coordinator  I received call from pt's daughter requesting me to explain why pt does not meet the medical necessity for an inpt rehab admit. I answered her questions and now daughter to discuss further with SW SNF vs HH. I will alert SW.  Danne Baxter, RN, MSN Rehab Admissions Coordinator [phone] 02/23/2019 11:10 AM

## 2019-02-24 LAB — PROTIME-INR
INR: 1.6 — ABNORMAL HIGH (ref 0.8–1.2)
Prothrombin Time: 19.2 seconds — ABNORMAL HIGH (ref 11.4–15.2)

## 2019-02-24 MED ORDER — WARFARIN SODIUM 3 MG PO TABS
6.0000 mg | ORAL_TABLET | Freq: Once | ORAL | Status: AC
  Administered 2019-02-24: 17:00:00 6 mg via ORAL
  Filled 2019-02-24: qty 2

## 2019-02-24 NOTE — Progress Notes (Signed)
Bed weight on low bed inaccurate and family at bedside did not want to stand patient up for standing weight at this time.

## 2019-02-24 NOTE — Progress Notes (Signed)
Physical Therapy Treatment Patient Details Name: Erica Carroll MRN: [id number] DOB: [birth year] Today's Date: 02/24/2019    History of Present Illness 85 yo s/p fall at home with Right pubic rami fx. PMHx: AFib, CKD, HOH, mild dementia, HTN    PT Comments    Pt performed gait training and functional mobility with max VCs for encouragement.  She is limited due to pain but does follow commands to push through to progress gait training.  Her daughter is present at bedside and pleased with her progress.  Plan for post acute rehab remains appropriate at this time.      Follow Up Recommendations  CIR;SNF;Supervision/Assistance - 24 hour     Equipment Recommendations  Rolling walker with 5" wheels;3in1 (PT)    Recommendations for Other Services       Precautions / Restrictions Precautions Precautions: Fall Restrictions Weight Bearing Restrictions: Yes RLE Weight Bearing: Weight bearing as tolerated LLE Weight Bearing: Weight bearing as tolerated    Mobility  Bed Mobility               General bed mobility comments: Pt seated in recliner on arrival,  She required moderate assistance to elevate trunk and scoot to edge of recliner to prepare for transfers.    Transfers Overall transfer level: Needs assistance Equipment used: Rolling walker (2 wheeled) Transfers: Sit to/from Stand Sit to Stand: Mod assist;+2 physical assistance         General transfer comment: Pt required cues for hand placement to and from seated surface with moderate assistance to boost into standing.  She presents with flexed posture and required tactile and verbal cues for postural corrections to achieve extension.    Ambulation/Gait Ambulation/Gait assistance: Mod assist;+2 safety/equipment Gait Distance (Feet): 8 Feet Assistive device: Rolling walker (2 wheeled) Gait Pattern/deviations: Step-to pattern;Antalgic;Decreased stance time - right;Decreased step length - right;Trunk flexed     General  Gait Details: Pt required step by step VCs for sequencing to advance gait distance today.  pt limited due to pain and fatigue.  required close chair follow and max VCs for encouragement.     Stairs             Wheelchair Mobility    Modified Rankin (Stroke Patients Only)       Balance Overall balance assessment: History of Falls;Needs assistance Sitting-balance support: Bilateral upper extremity supported;Feet supported Sitting balance-Leahy Scale: Fair       Standing balance-Leahy Scale: Poor                              Cognition Arousal/Alertness: Awake/alert Behavior During Therapy: Restless Overall Cognitive Status: Impaired/Different from baseline Area of Impairment: Following commands;Safety/judgement;Problem solving                 Orientation Level: Disoriented to;Situation   Memory: Decreased short-term memory Following Commands: Follows one step commands with increased time;Follows multi-step commands inconsistently Safety/Judgement: Decreased awareness of safety;Decreased awareness of deficits   Problem Solving: Slow processing;Difficulty sequencing;Requires verbal cues;Requires tactile cues;Decreased initiation General Comments: Daughter present and patient looking to daughter to answer all questions      Exercises      General Comments        Pertinent Vitals/Pain Pain Assessment: Faces Faces Pain Scale: Hurts even more Pain Location: Pelvis Pain Descriptors / Indicators: Grimacing;Guarding;Discomfort Pain Intervention(s): Monitored during session;Repositioned    Home Living  Prior Function            PT Goals (current goals can now be found in the care plan section) Acute Rehab PT Goals Patient Stated Goal: return home Potential to Achieve Goals: Good Progress towards PT goals: Progressing toward goals    Frequency    Min 4X/week      PT Plan Current plan remains appropriate     Co-evaluation              AM-PAC PT "6 Clicks" Mobility   Outcome Measure  Help needed turning from your back to your side while in a flat bed without using bedrails?: A Lot Help needed moving from lying on your back to sitting on the side of a flat bed without using bedrails?: A Lot Help needed moving to and from a bed to a chair (including a wheelchair)?: A Lot Help needed standing up from a chair using your arms (e.g., wheelchair or bedside chair)?: A Lot Help needed to walk in hospital room?: A Lot Help needed climbing 3-5 steps with a railing? : Total 6 Click Score: 11    End of Session Equipment Utilized During Treatment: Gait belt Activity Tolerance: Patient tolerated treatment well Patient left: in chair;with call bell/phone within reach;with chair alarm set;with family/visitor present Nurse Communication: Mobility status PT Visit Diagnosis: Other abnormalities of gait and mobility (R26.89);Muscle weakness (generalized) (M62.81);Pain;History of falling (Z91.81) Pain - Right/Left: Right Pain - part of body: Hip     Time: 4196-2229 PT Time Calculation (min) (ACUTE ONLY): 14 min  Charges:  $Gait Training: 8-22 mins                     Governor Rooks, PTA Acute Rehabilitation Services Pager [phone] Office [phone]     Erica Carroll Erica Carroll 02/24/2019, 4:21 PM

## 2019-02-24 NOTE — Progress Notes (Signed)
PROGRESS NOTE    Erica Carroll  LTJ:[id number] DOB: [birth year] DOA: 02/21/2019 PCP: Unk Pinto, MD   Brief Narrative:  Erica Carroll a 85 y.o.femalewithhistory of atrial fibrillation on metoprolol and Coumadin, sinus bradycardia status post pacemaker placement, chronic kidney disease stage III, chronic anemia mild dementia had a fall at home while walking. As per the daughter patient likely tripped on something in the hall and fell. Denies losing consciousness denies any chest pain nausea vomiting or diarrhea. Has been having pain in the right hip pelvic area.  ED Course:X-rays in the ER shows a right possible acute fractures of the right superior and inferior pubic rami. CT head and C-spine were unremarkable. EKG shows normal sinus rhythm with T wave changes which are comparableto the old EKG. Labs show anemia and mild hyponatremia and creatinine elevated at baseline. Patient has significant pain for which patient was given fentanyl and admitted for further observation.   Assessment & Plan:   Principal Problem:   Pelvic fracture (HCC) Active Problems:   Paroxysmal atrial fibrillation (HCC)   CKD stage G3b/A1, GFR 30-44 and albumin creatinine ratio <30 mg/g (HCC)   Mild dementia (HCC)   Bilateral fracture of pubic rami (HCC)   1. Acute fracture involving the right superior and inferior pubic rami status post mechanical fall-continue analgesics physical therapy consultshowed patient was unable to ambulate, needs 2 person assist to stand and pivot. Patient unsafe to discharge home high fall risk with worsening injury at rest.Seen by orthopedics nonoperative at this point.    Patient was declined for rehab, family interested in pursuing skilled nursing facility, social work pursuing  2. A. fib on metoprolol and Coumadin.Continue nodal blocking agents, Coumadin to be dosed per pharmacy. 3. Chronic kidney disease stage III creatinine appears to be at  baseline.Will monitor electrolytes 4. Chronic anemia likely from renal disease appears to be at baseline. Follow CBC. 5. Mild hyponatremia follow metabolic panel.. 6. Status post pacemaker placement for sinus bradycardia. Interrogation was done and reported to be normal no evidence of arrhythmia   DVT prophylaxis: QT:[id number]  Code Status: DNR    Code Status Orders  (From admission, onward)         Start     Ordered   02/22/19 0011  Do not attempt resuscitation (DNR)  Continuous    Question Answer Comment  In the event of cardiac or respiratory ARREST Do not call a code blue   In the event of cardiac or respiratory ARREST Do not perform Intubation, CPR, defibrillation or ACLS   In the event of cardiac or respiratory ARREST Use medication by any route, position, wound care, and other measures to relive pain and suffering. May use oxygen, suction and manual treatment of airway obstruction as needed for comfort.      02/22/19 0011        Code Status History    Date Active Date Inactive Code Status Order ID Comments User Context   02/22/2019 0007 02/22/2019 0011 DNR [id number]  Rise Patience, MD ED   02/12/2015 1702 02/13/2015 1846 Full Code [id number]  Erica Sprang, MD Inpatient   02/12/2015 1343 02/12/2015 1702 Full Code [id number]  Erica Schools, NP Inpatient   01/28/2015 1801 01/29/2015 1817 Full Code [id number]  Erica Margarita, MD Inpatient   07/10/2013 0945 07/14/2013 1420 Full Code [id number]  Erica Hillock, MD Inpatient   06/25/2013 2320 06/27/2013 2055 Full Code [id number]  Erica Falconer, MD Inpatient     Family  Communication: Discussed with patient's daughter who was bedside today I received exception to be available Disposition Plan:   Patient was converted to inpatient because of severity illness patient is unable to ambulate safely. She requires 2 person assist for stand and pivot only, she is unable perform any additional physical therapy activities. Because of this  patient high risk of falls with additional or worsening injuries. Consults called: Rehab Admission status: Inpatient   Consultants:   As above  Procedures:  Dg Lumbar Spine Complete  Result Date: 02/21/2019 CLINICAL DATA:  Status post fall, back pain EXAM: LUMBAR SPINE - COMPLETE 4+ VIEW COMPARISON:  None. FINDINGS: There are 5 nonrib bearing lumbar-type vertebral bodies. There is generalized osteopenia. The vertebral body heights are maintained. There is grade 1 anterolisthesis of L4 on L5 and L5 on S1. There is 3 mm retrolisthesis of L3 on L4. There is no spondylolysis. There is no acute fracture. There is degenerative disc disease with disc height loss at L3-4, L4-5 and L5-S1. There is degenerative disc disease with disc height loss at L1-2. The SI joints are unremarkable. IMPRESSION: No acute osseous injury of the lumbar spine. Electronically Signed   By: Kathreen Devoid   On: 02/21/2019 23:36   Ct Head Wo Contrast  Result Date: 02/21/2019 CLINICAL DATA:  Recent fall while on blood thinners EXAM: CT HEAD WITHOUT CONTRAST CT CERVICAL SPINE WITHOUT CONTRAST TECHNIQUE: Multidetector CT imaging of the head and cervical spine was performed following the standard protocol without intravenous contrast. Multiplanar CT image reconstructions of the cervical spine were also generated. COMPARISON:  01/28/2015 FINDINGS: CT HEAD FINDINGS Brain: No evidence of acute infarction, hemorrhage, hydrocephalus, extra-axial collection or mass lesion/mass effect. Mild atrophic changes are noted. Vascular: No hyperdense vessel or unexpected calcification. Skull: Normal. Negative for fracture or focal lesion. Sinuses/Orbits: No acute finding. Other: None. CT CERVICAL SPINE FINDINGS Alignment: Within normal limits. Skull base and vertebrae: 7 cervical segments are well visualized. Vertebral body height is well maintained. Facet hypertrophic changes are noted at multiple levels. No acute fracture or acute facet abnormality is  noted. Soft tissues and spinal canal: Surrounding soft tissue structures are within normal limits. Upper chest: Visualized lung apices are unremarkable. Other: None IMPRESSION: CT of the head: Atrophic changes without acute abnormality. CT of cervical spine: Multilevel degenerative change without acute abnormality. Electronically Signed   By: Inez Catalina M.D.   On: 02/21/2019 23:11   Ct Cervical Spine Wo Contrast  Result Date: 02/21/2019 CLINICAL DATA:  Recent fall while on blood thinners EXAM: CT HEAD WITHOUT CONTRAST CT CERVICAL SPINE WITHOUT CONTRAST TECHNIQUE: Multidetector CT imaging of the head and cervical spine was performed following the standard protocol without intravenous contrast. Multiplanar CT image reconstructions of the cervical spine were also generated. COMPARISON:  01/28/2015 FINDINGS: CT HEAD FINDINGS Brain: No evidence of acute infarction, hemorrhage, hydrocephalus, extra-axial collection or mass lesion/mass effect. Mild atrophic changes are noted. Vascular: No hyperdense vessel or unexpected calcification. Skull: Normal. Negative for fracture or focal lesion. Sinuses/Orbits: No acute finding. Other: None. CT CERVICAL SPINE FINDINGS Alignment: Within normal limits. Skull base and vertebrae: 7 cervical segments are well visualized. Vertebral body height is well maintained. Facet hypertrophic changes are noted at multiple levels. No acute fracture or acute facet abnormality is noted. Soft tissues and spinal canal: Surrounding soft tissue structures are within normal limits. Upper chest: Visualized lung apices are unremarkable. Other: None IMPRESSION: CT of the head: Atrophic changes without acute abnormality. CT of cervical spine: Multilevel  degenerative change without acute abnormality. Electronically Signed   By: Inez Catalina M.D.   On: 02/21/2019 23:11   Dg Pelvis Portable  Result Date: 02/21/2019 CLINICAL DATA:  Fall, question hip fracture EXAM: PORTABLE PELVIS 1-2 VIEWS COMPARISON:   None. FINDINGS: Coarse calcifications in the pelvis. Pubic symphysis is intact. Both femoral heads project in joint. There are mild degenerative changes. Possible fracture lucencies at the right superior and inferior pubic rami. IMPRESSION: Possible acute fractures involving the right superior and inferior pubic rami. Electronically Signed   By: Donavan Foil M.D.   On: 02/21/2019 23:01   Dg Pelvis Comp Min 3v  Result Date: 02/22/2019 CLINICAL DATA:  Pelvic fracture EXAM: JUDET PELVIS - 3+ VIEW COMPARISON:  the previous day's study FINDINGS: Minimally displaced fractures of the right superior ischial ramus and inferior ischiopubic ramus. No other pelvic ring fracture is identified. There is mild narrowing of articular cartilage in bilateral hips. No convincing femoral or acetabular fracture. No dislocation. Bilateral pelvic phleboliths. Mild spondylitic changes in the visualized lower lumbar spine. IMPRESSION: Minimally displaced right superior and inferior ischial ramus fractures. Electronically Signed   By: Lucrezia Europe M.D.   On: 02/22/2019 09:48   Dg Chest Port 1 View  Result Date: 02/21/2019 CLINICAL DATA:  Fall EXAM: PORTABLE CHEST 1 VIEW COMPARISON:  04/25/2015 FINDINGS: Left-sided pacing device with leads over right atrium and right ventricle. Mild cardiomegaly with aortic atherosclerosis. No acute opacity, pleural effusion or pneumothorax. Old right-sided rib fractures. IMPRESSION: No active disease.  Mild cardiomegaly Electronically Signed   By: Donavan Foil M.D.   On: 02/21/2019 22:59     Antimicrobials:   None   Subjective: Patient in bedside chair, requires 2 person assist to transition there and is having significant pain with movement   Objective: Vitals:   02/23/19 2011 02/24/19 0549 02/24/19 1000 02/24/19 1225  BP: (!) 142/72 (!) 136/53 (!) 110/46 125/68  Pulse: 78 65 70 65  Resp: 20 18  19   Temp: 98.6 F (37 C) 98.3 F (36.8 C)  97.6 F (36.4 C)  TempSrc:  Oral  Oral    SpO2: 96% 94% 98% 97%  Weight:      Height:        Intake/Output Summary (Last 24 hours) at 02/24/2019 1425 Last data filed at 02/24/2019 1326 Gross per 24 hour  Intake 1060 ml  Output 600 ml  Net 460 ml   Filed Weights   02/22/19 0647  Weight: 68 kg    Examination:  General exam: Appears calm in bedside chair Respiratory system: Clear to auscultation. Respiratory effort normal. Cardiovascular system: S1 & S2 heard, RRR. No JVD, murmurs, rubs, gallops or clicks. No pedal edema. Gastrointestinal system: Abdomen is nondistended, soft and nontender. No organomegaly or masses felt. Normal bowel sounds heard. Central nervous system: Alert and oriented. No focal neurological deficits. Extremities: Warm well perfused, limited range of motion exam secondary to pain with known fractures Skin: No rashes, lesions or ulcers, multiple ecchymosis Psychiatry: Pleasant and cooperative, known cognitive deficits with poor insight    Data Reviewed: I have personally reviewed following labs and imaging studies  CBC: Recent Labs  Lab 02/22/19 0008 02/22/19 0436 02/22/19 0500 02/23/19 1034  WBC 12.5* 9.9   9.8 DUP SEE [id number] 9.2  NEUTROABS 10.5* 7.7 PENDING  --   HGB 11.6* 10.3*   10.2* DUP SEE [id number] 11.1*  HCT 34.0* 29.9*   29.4* DUP SEE H7204 32.8*  MCV 91.2 89.8   89.1  DUP SEE H7204 91.1  PLT PLATELET CLUMPS NOTED ON SMEAR, UNABLE TO ESTIMATE 131*   140* DUP SEE H7204 793*   Basic Metabolic Panel: Recent Labs  Lab 02/22/19 0008 02/22/19 0436  NA 132* 132*  K 4.5 4.3  CL 100 101  CO2 22 23  GLUCOSE 123* 127*  BUN 10 11  CREATININE 1.30* 1.21*  CALCIUM 8.7* 8.6*   GFR: Estimated Creatinine Clearance: 26.9 mL/min (A) (by C-G formula based on SCr of 1.21 mg/dL (H)). Liver Function Tests: Recent Labs  Lab 02/22/19 0008  AST 29  ALT 12  ALKPHOS 55  BILITOT 0.9  PROT 6.9  ALBUMIN 3.8   No results for input(s): LIPASE, AMYLASE in the last 168 hours. No results for  input(s): AMMONIA in the last 168 hours. Coagulation Profile: Recent Labs  Lab 02/22/19 0436 02/23/19 0800 02/24/19 0627  INR 2.1* 1.6* 1.6*   Cardiac Enzymes: No results for input(s): CKTOTAL, CKMB, CKMBINDEX, TROPONINI in the last 168 hours. BNP (last 3 results) No results for input(s): PROBNP in the last 8760 hours. HbA1C: No results for input(s): HGBA1C in the last 72 hours. CBG: No results for input(s): GLUCAP in the last 168 hours. Lipid Profile: No results for input(s): CHOL, HDL, LDLCALC, TRIG, CHOLHDL, LDLDIRECT in the last 72 hours. Thyroid Function Tests: No results for input(s): TSH, T4TOTAL, FREET4, T3FREE, THYROIDAB in the last 72 hours. Anemia Panel: No results for input(s): VITAMINB12, FOLATE, FERRITIN, TIBC, IRON, RETICCTPCT in the last 72 hours. Sepsis Labs: No results for input(s): PROCALCITON, LATICACIDVEN in the last 168 hours.  Recent Results (from the past 240 hour(s))  SARS Coronavirus 2 (CEPHEID - Performed in [place] hospital lab), Hosp Order     Status: None   Collection Time: 02/22/19 12:14 AM  Result Value Ref Range Status   SARS Coronavirus 2 NEGATIVE NEGATIVE Final    Comment: (NOTE) If result is NEGATIVE SARS-CoV-2 target nucleic acids are NOT DETECTED. The SARS-CoV-2 RNA is generally detectable in upper and lower  respiratory specimens during the acute phase of infection. The lowest  concentration of SARS-CoV-2 viral copies this assay can detect is 250  copies / mL. A negative result does not preclude SARS-CoV-2 infection  and should not be used as the sole basis for treatment or other  patient management decisions.  A negative result may occur with  improper specimen collection / handling, submission of specimen other  than nasopharyngeal swab, presence of viral mutation(s) within the  areas targeted by this assay, and inadequate number of viral copies  (<250 copies / mL). A negative result must be combined with clinical  observations,  patient history, and epidemiological information. If result is POSITIVE SARS-CoV-2 target nucleic acids are DETECTED. The SARS-CoV-2 RNA is generally detectable in upper and lower  respiratory specimens dur ing the acute phase of infection.  Positive  results are indicative of active infection with SARS-CoV-2.  Clinical  correlation with patient history and other diagnostic information is  necessary to determine patient infection status.  Positive results do  not rule out bacterial infection or co-infection with other viruses. If result is PRESUMPTIVE POSTIVE SARS-CoV-2 nucleic acids MAY BE PRESENT.   A presumptive positive result was obtained on the submitted specimen  and confirmed on repeat testing.  While 2019 novel coronavirus  (SARS-CoV-2) nucleic acids may be present in the submitted sample  additional confirmatory testing may be necessary for epidemiological  and / or clinical management purposes  to differentiate between  SARS-CoV-2 and other Sarbecovirus currently known to infect humans.  If clinically indicated additional testing with an alternate test  methodology [phone]) is advised. The SARS-CoV-2 RNA is generally  detectable in upper and lower respiratory sp ecimens during the acute  phase of infection. The expected result is Negative. Fact Sheet for Patients:  StrictlyIdeas.no Fact Sheet for Healthcare Providers: BankingDealers.co.za This test is not yet approved or cleared by the [place] FDA and has been authorized for detection and/or diagnosis of SARS-CoV-2 by FDA under an Emergency Use Authorization (EUA).  This EUA will remain in effect (meaning this test can be used) for the duration of the COVID-19 declaration under Section 564(b)(1) of the Act, 21 U.S.C. section 360bbb-3(b)(1), unless the authorization is terminated or revoked sooner. Performed at [facility], [place] [address], [place],  [place] [address]          Radiology Studies: No results found.      Scheduled Meds:  metoprolol tartrate  75 mg Oral BID   warfarin  6 mg Oral ONCE-1800   Warfarin - Pharmacist Dosing Inpatient   Does not apply q1800   Continuous Infusions:   LOS: 2 days    Time spent: 35 min     Nicolette Bang, MD [facility]  If 7PM-7AM, please contact night-coverage  02/24/2019, 2:25 PM

## 2019-02-24 NOTE — Progress Notes (Signed)
[place] for warfarin Indication: atrial fibrillation  Allergies  Allergen Reactions  . Other Other (See Comments)    News Print: (freshly printed newspapers) cause her eyes to water . Allergy type reaction. Medal: itching & rash    Vital Signs: Temp: 97.6 F (36.4 C) (05/30 1225) Temp Source: Oral (05/30 1225) BP: 125/68 (05/30 1225) Pulse Rate: 65 (05/30 1225)   Assessment: 85yo female presents after fall sustaining pubic rami fracture - no operative management. Pharmacy consulted to manage warfarin inpatient.  INR is subtherapeutic at 1.6 s/p boost dose last pm. No bleeding noted, no CBC for today.  Home regimen: 5 mg daily except 2.5 mg Thur/Sun  Goal of Therapy:  INR 2-3   Plan:  Warfarin 6 mg PO tonight - repeat  Daily INR Monitor for s/sx of bleeding   Bonnita Nasuti Pharm.D. CPP, BCPS Clinical Pharmacist [phone] 02/24/2019 1:46 PM

## 2019-02-25 LAB — PROTIME-INR
INR: 2.1 — ABNORMAL HIGH (ref 0.8–1.2)
Prothrombin Time: 23.6 seconds — ABNORMAL HIGH (ref 11.4–15.2)

## 2019-02-25 MED ORDER — WARFARIN SODIUM 2.5 MG PO TABS
2.5000 mg | ORAL_TABLET | Freq: Once | ORAL | Status: AC
  Administered 2019-02-25: 2.5 mg via ORAL
  Filled 2019-02-25: qty 1

## 2019-02-25 NOTE — Plan of Care (Signed)
Patient stable, discussed POC with patient and daughter, agreeable with plan, denies question/concerns at this time.  

## 2019-02-25 NOTE — Progress Notes (Signed)
PROGRESS NOTE    [name]  VZC:[id number] DOB: 11-May-1922 DOA: 02/21/2019 PCP: Unk Pinto, MD   Brief Narrative:  Erica Carroll a 85 y.o.femalewithhistory of atrial fibrillation on metoprolol and Coumadin, sinus bradycardia status post pacemaker placement, chronic kidney disease stage III, chronic anemia mild dementia had a fall at home while walking. As per the daughter patient likely tripped on something in the hall and fell. Denies losing consciousness denies any chest pain nausea vomiting or diarrhea. Has been having pain in the right hip pelvic area.  ED Course:X-rays in the ER shows a right possible acute fractures of the right superior and inferior pubic rami. CT head and C-spine were unremarkable. EKG shows normal sinus rhythm with T wave changes which are comparableto the old EKG. Labs show anemia and mild hyponatremia and creatinine elevated at baseline. Patient has significant pain for which patient was given fentanyl and admitted for further observation.   Assessment & Plan:   Principal Problem:   Pelvic fracture (HCC) Active Problems:   Paroxysmal atrial fibrillation (HCC)   CKD stage G3b/A1, GFR 30-44 and albumin creatinine ratio <30 mg/g (HCC)   Mild dementia (HCC)   Bilateral fracture of pubic rami (HCC)   1. Acute fracture involving the right superior and inferior pubic rami status post mechanical fall-continue analgesicsphysical therapy consultshowed patient was unable to ambulate, needs 2 person assist to stand and pivot. Patient unsafe to discharge home high fall risk with worsening injury at rest.Seen by orthopedics nonoperative at this point.  Patient was declined for rehab, family interested in pursuing skilled nursing facility, social work pursuing- no changes 2. A. fib on metoprolol and Coumadin.Continue nodal blocking agents, Coumadin to be dosed per pharmacy. 3. Chronic kidney disease stage III creatinine appears to be  at baseline.Will monitor electrolytes 4. Chronic anemia likely from renal disease appears to be at baseline. Follow CBC. 5. Mild hyponatremia follow metabolic panel.. 6. Status post pacemaker placement for sinus bradycardia. Interrogation was done and reported to be normal no evidence of arrhythmia  PT/OT evaluations performed. SNF recommended. SNF appropriate as the patient has received 3 days of hospital care (or the 3 day period has been waived by the patient's insurance company) and is felt to need rehab services to restore this patient to their prior level of function to achieve safe transition back to home care. This patient needs rehab services for at least 5 days per week and skilled nursing services daily to facilitate this transition.  Rehab is being requested as the most appropriate d/c option for this patient and is NOT felt to be for custodial care as evidenced by ability to ambulate without pain and perform ADL's.   DVT prophylaxis: JO:INOMVEHM  Code Status: DNR    Code Status Orders  (From admission, onward)         Start     Ordered   02/22/19 0011  Do not attempt resuscitation (DNR)  Continuous    Question Answer Comment  In the event of cardiac or respiratory ARREST Do not call a code blue   In the event of cardiac or respiratory ARREST Do not perform Intubation, CPR, defibrillation or ACLS   In the event of cardiac or respiratory ARREST Use medication by any route, position, wound care, and other measures to relive pain and suffering. May use oxygen, suction and manual treatment of airway obstruction as needed for comfort.      02/22/19 0011        Code  Status History    Date Active Date Inactive Code Status Order ID Comments User Context   02/22/2019 0007 02/22/2019 0011 DNR [id number]  Rise Patience, MD ED   02/12/2015 1702 02/13/2015 1846 Full Code [id number]  Deboraha Sprang, MD Inpatient   02/12/2015 1343 02/12/2015 1702 Full Code [id number]  Melina Schools, NP Inpatient   01/28/2015 1801 01/29/2015 1817 Full Code [id number]  Sueanne Margarita, MD Inpatient   07/10/2013 0945 07/14/2013 1420 Full Code [id number]  Oswald Hillock, MD Inpatient   06/25/2013 2320 06/27/2013 2055 Full Code [id number]  Orvan Falconer, MD Inpatient     Family Communication: DAUGHTER AT BEDSIDE  Disposition Plan:   Patient was converted to inpatient because of severity illness patient is unable to ambulate safely. She requires 2 person assist for stand and pivot only, she is unable perform any additional physical therapy activities. Because of this patient high risk of falls with additional or worsening injuries. Consults called: rehab Admission status: Inpatient   Consultants:   as above  Procedures:  Dg Lumbar Spine Complete  Result Date: 02/21/2019 CLINICAL DATA:  Status post fall, back pain EXAM: LUMBAR SPINE - COMPLETE 4+ VIEW COMPARISON:  None. FINDINGS: There are 5 nonrib bearing lumbar-type vertebral bodies. There is generalized osteopenia. The vertebral body heights are maintained. There is grade 1 anterolisthesis of L4 on L5 and L5 on S1. There is 3 mm retrolisthesis of L3 on L4. There is no spondylolysis. There is no acute fracture. There is degenerative disc disease with disc height loss at L3-4, L4-5 and L5-S1. There is degenerative disc disease with disc height loss at L1-2. The SI joints are unremarkable. IMPRESSION: No acute osseous injury of the lumbar spine. Electronically Signed   By: Kathreen Devoid   On: 02/21/2019 23:36   Ct Head Wo Contrast  Result Date: 02/21/2019 CLINICAL DATA:  Recent fall while on blood thinners EXAM: CT HEAD WITHOUT CONTRAST CT CERVICAL SPINE WITHOUT CONTRAST TECHNIQUE: Multidetector CT imaging of the head and cervical spine was performed following the standard protocol without intravenous contrast. Multiplanar CT image reconstructions of the cervical spine were also generated. COMPARISON:  01/28/2015 FINDINGS: CT HEAD FINDINGS Brain:  No evidence of acute infarction, hemorrhage, hydrocephalus, extra-axial collection or mass lesion/mass effect. Mild atrophic changes are noted. Vascular: No hyperdense vessel or unexpected calcification. Skull: Normal. Negative for fracture or focal lesion. Sinuses/Orbits: No acute finding. Other: None. CT CERVICAL SPINE FINDINGS Alignment: Within normal limits. Skull base and vertebrae: 7 cervical segments are well visualized. Vertebral body height is well maintained. Facet hypertrophic changes are noted at multiple levels. No acute fracture or acute facet abnormality is noted. Soft tissues and spinal canal: Surrounding soft tissue structures are within normal limits. Upper chest: Visualized lung apices are unremarkable. Other: None IMPRESSION: CT of the head: Atrophic changes without acute abnormality. CT of cervical spine: Multilevel degenerative change without acute abnormality. Electronically Signed   By: Inez Catalina M.D.   On: 02/21/2019 23:11   Ct Cervical Spine Wo Contrast  Result Date: 02/21/2019 CLINICAL DATA:  Recent fall while on blood thinners EXAM: CT HEAD WITHOUT CONTRAST CT CERVICAL SPINE WITHOUT CONTRAST TECHNIQUE: Multidetector CT imaging of the head and cervical spine was performed following the standard protocol without intravenous contrast. Multiplanar CT image reconstructions of the cervical spine were also generated. COMPARISON:  01/28/2015 FINDINGS: CT HEAD FINDINGS Brain: No evidence of acute infarction, hemorrhage, hydrocephalus, extra-axial collection or mass lesion/mass effect. Mild atrophic changes are noted.  Vascular: No hyperdense vessel or unexpected calcification. Skull: Normal. Negative for fracture or focal lesion. Sinuses/Orbits: No acute finding. Other: None. CT CERVICAL SPINE FINDINGS Alignment: Within normal limits. Skull base and vertebrae: 7 cervical segments are well visualized. Vertebral body height is well maintained. Facet hypertrophic changes are noted at multiple  levels. No acute fracture or acute facet abnormality is noted. Soft tissues and spinal canal: Surrounding soft tissue structures are within normal limits. Upper chest: Visualized lung apices are unremarkable. Other: None IMPRESSION: CT of the head: Atrophic changes without acute abnormality. CT of cervical spine: Multilevel degenerative change without acute abnormality. Electronically Signed   By: Inez Catalina M.D.   On: 02/21/2019 23:11   Dg Pelvis Portable  Result Date: 02/21/2019 CLINICAL DATA:  Fall, question hip fracture EXAM: PORTABLE PELVIS 1-2 VIEWS COMPARISON:  None. FINDINGS: Coarse calcifications in the pelvis. Pubic symphysis is intact. Both femoral heads project in joint. There are mild degenerative changes. Possible fracture lucencies at the right superior and inferior pubic rami. IMPRESSION: Possible acute fractures involving the right superior and inferior pubic rami. Electronically Signed   By: Donavan Foil M.D.   On: 02/21/2019 23:01   Dg Pelvis Comp Min 3v  Result Date: 02/22/2019 CLINICAL DATA:  Pelvic fracture EXAM: JUDET PELVIS - 3+ VIEW COMPARISON:  the previous day's study FINDINGS: Minimally displaced fractures of the right superior ischial ramus and inferior ischiopubic ramus. No other pelvic ring fracture is identified. There is mild narrowing of articular cartilage in bilateral hips. No convincing femoral or acetabular fracture. No dislocation. Bilateral pelvic phleboliths. Mild spondylitic changes in the visualized lower lumbar spine. IMPRESSION: Minimally displaced right superior and inferior ischial ramus fractures. Electronically Signed   By: Lucrezia Europe M.D.   On: 02/22/2019 09:48   Dg Chest Port 1 View  Result Date: 02/21/2019 CLINICAL DATA:  Fall EXAM: PORTABLE CHEST 1 VIEW COMPARISON:  04/25/2015 FINDINGS: Left-sided pacing device with leads over right atrium and right ventricle. Mild cardiomegaly with aortic atherosclerosis. No acute opacity, pleural effusion or  pneumothorax. Old right-sided rib fractures. IMPRESSION: No active disease.  Mild cardiomegaly Electronically Signed   By: Donavan Foil M.D.   On: 02/21/2019 22:59     Antimicrobials:   none    Subjective: Patient reports ambulating with mildly less pain although still significant.  Objective: Vitals:   02/24/19 1000 02/24/19 1225 02/24/19 1946 02/25/19 0608  BP: (!) 110/46 125/68 125/72 140/76  Pulse: 70 65 61 61  Resp:  19  18  Temp:  97.6 F (36.4 C) 98.2 F (36.8 C) 97.9 F (36.6 C)  TempSrc:  Oral Oral Oral  SpO2: 98% 97% 96% 95%  Weight:      Height:        Intake/Output Summary (Last 24 hours) at 02/25/2019 1124 Last data filed at 02/25/2019 0644 Gross per 24 hour  Intake 120 ml  Output 1225 ml  Net -1105 ml   Filed Weights   02/22/19 0647  Weight: 68 kg    Examination:  General exam: Appears calm and comfortable  Respiratory system: Clear to auscultation. Respiratory effort normal. Cardiovascular system: S1 & S2 heard, RRR. No JVD, murmurs, rubs, gallops or clicks. No pedal edema. Gastrointestinal system: Abdomen is nondistended, soft and nontender. No organomegaly or masses felt. Normal bowel sounds heard. Central nervous system: Alert, No focal neurological deficits. Extremities: Limited range of motion secondary to pain, no contractures warm well perfused. Skin: No rashes, lesions or ulcers Psychiatry: Judgement and insight  appear normal although known chronic cognitive deficits. Mood & affect appropriate.     Data Reviewed: I have personally reviewed following labs and imaging studies  CBC: Recent Labs  Lab 02/22/19 0008 02/22/19 0436 02/22/19 0500 02/23/19 1034  WBC 12.5* 9.9   9.8 DUP SEE [id number] 9.2  NEUTROABS 10.5* 7.7 PENDING  --   HGB 11.6* 10.3*   10.2* DUP SEE [id number] 11.1*  HCT 34.0* 29.9*   29.4* DUP SEE H7204 32.8*  MCV 91.2 89.8   89.1 DUP SEE H7204 91.1  PLT PLATELET CLUMPS NOTED ON SMEAR, UNABLE TO ESTIMATE 131*   140* DUP SEE  H7204 366*   Basic Metabolic Panel: Recent Labs  Lab 02/22/19 0008 02/22/19 0436  NA 132* 132*  K 4.5 4.3  CL 100 101  CO2 22 23  GLUCOSE 123* 127*  BUN 10 11  CREATININE 1.30* 1.21*  CALCIUM 8.7* 8.6*   GFR: Estimated Creatinine Clearance: 26.9 mL/min (A) (by C-G formula based on SCr of 1.21 mg/dL (H)). Liver Function Tests: Recent Labs  Lab 02/22/19 0008  AST 29  ALT 12  ALKPHOS 55  BILITOT 0.9  PROT 6.9  ALBUMIN 3.8   No results for input(s): LIPASE, AMYLASE in the last 168 hours. No results for input(s): AMMONIA in the last 168 hours. Coagulation Profile: Recent Labs  Lab 02/22/19 0436 02/23/19 0800 02/24/19 0627 02/25/19 0659  INR 2.1* 1.6* 1.6* 2.1*   Cardiac Enzymes: No results for input(s): CKTOTAL, CKMB, CKMBINDEX, TROPONINI in the last 168 hours. BNP (last 3 results) No results for input(s): PROBNP in the last 8760 hours. HbA1C: No results for input(s): HGBA1C in the last 72 hours. CBG: No results for input(s): GLUCAP in the last 168 hours. Lipid Profile: No results for input(s): CHOL, HDL, LDLCALC, TRIG, CHOLHDL, LDLDIRECT in the last 72 hours. Thyroid Function Tests: No results for input(s): TSH, T4TOTAL, FREET4, T3FREE, THYROIDAB in the last 72 hours. Anemia Panel: No results for input(s): VITAMINB12, FOLATE, FERRITIN, TIBC, IRON, RETICCTPCT in the last 72 hours. Sepsis Labs: No results for input(s): PROCALCITON, LATICACIDVEN in the last 168 hours.  Recent Results (from the past 240 hour(s))  SARS Coronavirus 2 (CEPHEID - Performed in [place] hospital lab), Hosp Order     Status: None   Collection Time: 02/22/19 12:14 AM  Result Value Ref Range Status   SARS Coronavirus 2 NEGATIVE NEGATIVE Final    Comment: (NOTE) If result is NEGATIVE SARS-CoV-2 target nucleic acids are NOT DETECTED. The SARS-CoV-2 RNA is generally detectable in upper and lower  respiratory specimens during the acute phase of infection. The lowest  concentration of  SARS-CoV-2 viral copies this assay can detect is 250  copies / mL. A negative result does not preclude SARS-CoV-2 infection  and should not be used as the sole basis for treatment or other  patient management decisions.  A negative result may occur with  improper specimen collection / handling, submission of specimen other  than nasopharyngeal swab, presence of viral mutation(s) within the  areas targeted by this assay, and inadequate number of viral copies  (<250 copies / mL). A negative result must be combined with clinical  observations, patient history, and epidemiological information. If result is POSITIVE SARS-CoV-2 target nucleic acids are DETECTED. The SARS-CoV-2 RNA is generally detectable in upper and lower  respiratory specimens dur ing the acute phase of infection.  Positive  results are indicative of active infection with SARS-CoV-2.  Clinical  correlation with patient history and other  diagnostic information is  necessary to determine patient infection status.  Positive results do  not rule out bacterial infection or co-infection with other viruses. If result is PRESUMPTIVE POSTIVE SARS-CoV-2 nucleic acids MAY BE PRESENT.   A presumptive positive result was obtained on the submitted specimen  and confirmed on repeat testing.  While 2019 novel coronavirus  (SARS-CoV-2) nucleic acids may be present in the submitted sample  additional confirmatory testing may be necessary for epidemiological  and / or clinical management purposes  to differentiate between  SARS-CoV-2 and other Sarbecovirus currently known to infect humans.  If clinically indicated additional testing with an alternate test  methodology [phone]) is advised. The SARS-CoV-2 RNA is generally  detectable in upper and lower respiratory sp ecimens during the acute  phase of infection. The expected result is Negative. Fact Sheet for Patients:  StrictlyIdeas.no Fact Sheet for Healthcare  Providers: BankingDealers.co.za This test is not yet approved or cleared by the [place] FDA and has been authorized for detection and/or diagnosis of SARS-CoV-2 by FDA under an Emergency Use Authorization (EUA).  This EUA will remain in effect (meaning this test can be used) for the duration of the COVID-19 declaration under Section 564(b)(1) of the Act, 21 U.S.C. section 360bbb-3(b)(1), unless the authorization is terminated or revoked sooner. Performed at [facility], [place] [address] [place], [place] [address]          Radiology Studies: No results found.      Scheduled Meds:  metoprolol tartrate  75 mg Oral BID   warfarin  2.5 mg Oral ONCE-1800   Warfarin - Pharmacist Dosing Inpatient   Does not apply q1800   Continuous Infusions:   LOS: 3 days    Time spent: 35 min    Nicolette Bang, MD [facility]  If 7PM-7AM, please contact night-coverage  02/25/2019, 11:24 AM

## 2019-02-25 NOTE — Progress Notes (Signed)
[place] for warfarin Indication: atrial fibrillation  Allergies  Allergen Reactions  . Other Other (See Comments)    News Print: (freshly printed newspapers) cause her eyes to water . Allergy type reaction. Medal: itching & rash    Vital Signs: Temp: 97.9 F (36.6 C) (05/31 0608) Temp Source: Oral (05/31 5374) BP: 140/76 (05/31 8270) Pulse Rate: 61 (05/31 7867)   Assessment: 85yo female presents after fall sustaining pubic rami fracture - no operative management. Pharmacy consulted to manage warfarin inpatient.  INR is back in goal range today after a few doses higher than home dose.  CBC stable, no overt bleeding or complications noted.  Home regimen: 5 mg daily except 2.5 mg Thur/Sun  Goal of Therapy:  INR 2-3   Plan:  Warfarin 2.5 mg x 1 tonight (consistent with home dose) Daily INR Monitor for s/sx of bleeding  Marguerite Olea, [facility] Clinical Pharmacist Phone [phone]  02/25/2019 9:52 AM

## 2019-02-26 LAB — PROTIME-INR
INR: 2.1 — ABNORMAL HIGH (ref 0.8–1.2)
Prothrombin Time: 22.9 seconds — ABNORMAL HIGH (ref 11.4–15.2)

## 2019-02-26 MED ORDER — WARFARIN SODIUM 5 MG PO TABS
5.0000 mg | ORAL_TABLET | Freq: Once | ORAL | Status: AC
  Administered 2019-02-26: 5 mg via ORAL
  Filled 2019-02-26: qty 1

## 2019-02-26 NOTE — Progress Notes (Signed)
PROGRESS NOTE    Erica Carroll  FYB:[id number] DOB: 05/04/1922 DOA: 02/21/2019 PCP: Unk Pinto, MD   Brief Narrative:  Erica Carroll a 85 y.o.femalewithhistory of atrial fibrillation on metoprolol and Coumadin, sinus bradycardia status post pacemaker placement, chronic kidney disease stage III, chronic anemia mild dementia had a fall at home while walking. As per the daughter patient likely tripped on something in the hall and fell. Denies losing consciousness denies any chest pain nausea vomiting or diarrhea. Has been having pain in the right hip pelvic area.  ED Course:X-rays in the ER shows a right possible acute fractures of the right superior and inferior pubic rami. CT head and C-spine were unremarkable. EKG shows normal sinus rhythm with T wave changes which are comparableto the old EKG. Labs show anemia and mild hyponatremia and creatinine elevated at baseline. Patient has significant pain for which patient was given fentanyl and admitted for further observation.   Assessment & Plan:   Principal Problem:   Pelvic fracture (HCC) Active Problems:   Paroxysmal atrial fibrillation (HCC)   CKD stage G3b/A1, GFR 30-44 and albumin creatinine ratio <30 mg/g (HCC)   Mild dementia (HCC)   Bilateral fracture of pubic rami (HCC)   1. Acute fracture involving the right superior and inferior pubic rami status post mechanical fall-continue analgesicsphysical therapy consultshowed patient was unable to safely ambulate, needing 2 person assist to stand and pivot. Patient unsafe to discharge home high fall risk with worsening injury at rest.Seen by orthopedics nonoperative at this point.Patient was declined for rehab, family interested in pursuing skilled nursing facility, social work pursuing- no changes-awaiting auth 2. A. fib on metoprolol and Coumadin.Continue nodal blocking agents, Coumadin to be dosed per pharmacy. 3. Chronic kidney disease stage III  creatinine appears to be at baseline.Will monitor electrolytes 4. Chronic anemia likely from renal disease appears to be at baseline. Follow CBC. 5. Mild hyponatremia follow metabolic panel.. 6. Status post pacemaker placement for sinus bradycardia. Interrogation was done and reported to be normal no evidence of arrhythmia  PT/OT evaluations performed. SNF recommended. SNF appropriate as the patient has received 3 days of hospital care (or the 3 day period has been waived by the patient's insurance company) and is felt to need rehab services to restore this patient to their prior level of function to achieve safe transition back to home care. This patient needs rehab services for at least 5 days per week and skilled nursing services daily to facilitate this transition.  Rehab is being requested as the most appropriate d/c option for this patient and is NOT felt to be for custodial care as evidenced by ability to ambulate without pain and perform ADL's.  DVT prophylaxis: NI:[id number]  Code Status: DNR    Code Status Orders  (From admission, onward)         Start     Ordered   02/22/19 0011  Do not attempt resuscitation (DNR)  Continuous    Question Answer Comment  In the event of cardiac or respiratory ARREST Do not call a code blue   In the event of cardiac or respiratory ARREST Do not perform Intubation, CPR, defibrillation or ACLS   In the event of cardiac or respiratory ARREST Use medication by any route, position, wound care, and other measures to relive pain and suffering. May use oxygen, suction and manual treatment of airway obstruction as needed for comfort.      02/22/19 0011        Code Status  History    Date Active Date Inactive Code Status Order ID Comments User Context   02/22/2019 0007 02/22/2019 0011 DNR [id number]  Rise Patience, MD ED   02/12/2015 1702 02/13/2015 1846 Full Code [id number]  Deboraha Sprang, MD Inpatient   02/12/2015 1343 02/12/2015 1702 Full Code  [id number]  Melina Schools, NP Inpatient   01/28/2015 1801 01/29/2015 1817 Full Code [id number]  Sueanne Margarita, MD Inpatient   07/10/2013 0945 07/14/2013 1420 Full Code [id number]  Oswald Hillock, MD Inpatient   06/25/2013 2320 06/27/2013 2055 Full Code [id number]  Orvan Falconer, MD Inpatient     Family Communication: DAUGHTER AT BEDSIDE  Disposition Plan:   Patient was converted to inpatient because of severity illness patient is unable to ambulate safely. She requires 2 person assist for stand and pivot only, she is unable perform any additional physical therapy activities. Because of this patient high risk of falls with additional or worsening injuries. Consults called: rehab Admission status: Inpatient  Consultants:   AS ABOVE  Procedures:  Dg Lumbar Spine Complete  Result Date: 02/21/2019 CLINICAL DATA:  Status post fall, back pain EXAM: LUMBAR SPINE - COMPLETE 4+ VIEW COMPARISON:  None. FINDINGS: There are 5 nonrib bearing lumbar-type vertebral bodies. There is generalized osteopenia. The vertebral body heights are maintained. There is grade 1 anterolisthesis of L4 on L5 and L5 on S1. There is 3 mm retrolisthesis of L3 on L4. There is no spondylolysis. There is no acute fracture. There is degenerative disc disease with disc height loss at L3-4, L4-5 and L5-S1. There is degenerative disc disease with disc height loss at L1-2. The SI joints are unremarkable. IMPRESSION: No acute osseous injury of the lumbar spine. Electronically Signed   By: Kathreen Devoid   On: 02/21/2019 23:36   Ct Head Wo Contrast  Result Date: 02/21/2019 CLINICAL DATA:  Recent fall while on blood thinners EXAM: CT HEAD WITHOUT CONTRAST CT CERVICAL SPINE WITHOUT CONTRAST TECHNIQUE: Multidetector CT imaging of the head and cervical spine was performed following the standard protocol without intravenous contrast. Multiplanar CT image reconstructions of the cervical spine were also generated. COMPARISON:  01/28/2015 FINDINGS: CT  HEAD FINDINGS Brain: No evidence of acute infarction, hemorrhage, hydrocephalus, extra-axial collection or mass lesion/mass effect. Mild atrophic changes are noted. Vascular: No hyperdense vessel or unexpected calcification. Skull: Normal. Negative for fracture or focal lesion. Sinuses/Orbits: No acute finding. Other: None. CT CERVICAL SPINE FINDINGS Alignment: Within normal limits. Skull base and vertebrae: 7 cervical segments are well visualized. Vertebral body height is well maintained. Facet hypertrophic changes are noted at multiple levels. No acute fracture or acute facet abnormality is noted. Soft tissues and spinal canal: Surrounding soft tissue structures are within normal limits. Upper chest: Visualized lung apices are unremarkable. Other: None IMPRESSION: CT of the head: Atrophic changes without acute abnormality. CT of cervical spine: Multilevel degenerative change without acute abnormality. Electronically Signed   By: Inez Catalina M.D.   On: 02/21/2019 23:11   Ct Cervical Spine Wo Contrast  Result Date: 02/21/2019 CLINICAL DATA:  Recent fall while on blood thinners EXAM: CT HEAD WITHOUT CONTRAST CT CERVICAL SPINE WITHOUT CONTRAST TECHNIQUE: Multidetector CT imaging of the head and cervical spine was performed following the standard protocol without intravenous contrast. Multiplanar CT image reconstructions of the cervical spine were also generated. COMPARISON:  01/28/2015 FINDINGS: CT HEAD FINDINGS Brain: No evidence of acute infarction, hemorrhage, hydrocephalus, extra-axial collection or mass lesion/mass effect. Mild atrophic changes are noted. Vascular: No  hyperdense vessel or unexpected calcification. Skull: Normal. Negative for fracture or focal lesion. Sinuses/Orbits: No acute finding. Other: None. CT CERVICAL SPINE FINDINGS Alignment: Within normal limits. Skull base and vertebrae: 7 cervical segments are well visualized. Vertebral body height is well maintained. Facet hypertrophic changes  are noted at multiple levels. No acute fracture or acute facet abnormality is noted. Soft tissues and spinal canal: Surrounding soft tissue structures are within normal limits. Upper chest: Visualized lung apices are unremarkable. Other: None IMPRESSION: CT of the head: Atrophic changes without acute abnormality. CT of cervical spine: Multilevel degenerative change without acute abnormality. Electronically Signed   By: Inez Catalina M.D.   On: 02/21/2019 23:11   Dg Pelvis Portable  Result Date: 02/21/2019 CLINICAL DATA:  Fall, question hip fracture EXAM: PORTABLE PELVIS 1-2 VIEWS COMPARISON:  None. FINDINGS: Coarse calcifications in the pelvis. Pubic symphysis is intact. Both femoral heads project in joint. There are mild degenerative changes. Possible fracture lucencies at the right superior and inferior pubic rami. IMPRESSION: Possible acute fractures involving the right superior and inferior pubic rami. Electronically Signed   By: Donavan Foil M.D.   On: 02/21/2019 23:01   Dg Pelvis Comp Min 3v  Result Date: 02/22/2019 CLINICAL DATA:  Pelvic fracture EXAM: JUDET PELVIS - 3+ VIEW COMPARISON:  the previous day's study FINDINGS: Minimally displaced fractures of the right superior ischial ramus and inferior ischiopubic ramus. No other pelvic ring fracture is identified. There is mild narrowing of articular cartilage in bilateral hips. No convincing femoral or acetabular fracture. No dislocation. Bilateral pelvic phleboliths. Mild spondylitic changes in the visualized lower lumbar spine. IMPRESSION: Minimally displaced right superior and inferior ischial ramus fractures. Electronically Signed   By: Lucrezia Europe M.D.   On: 02/22/2019 09:48   Dg Chest Port 1 View  Result Date: 02/21/2019 CLINICAL DATA:  Fall EXAM: PORTABLE CHEST 1 VIEW COMPARISON:  04/25/2015 FINDINGS: Left-sided pacing device with leads over right atrium and right ventricle. Mild cardiomegaly with aortic atherosclerosis. No acute opacity,  pleural effusion or pneumothorax. Old right-sided rib fractures. IMPRESSION: No active disease.  Mild cardiomegaly Electronically Signed   By: Donavan Foil M.D.   On: 02/21/2019 22:59     Antimicrobials:   NONE    Subjective: Patient ambulating with assistance from bed to chair still with significant pain high fall risk  Objective: Vitals:   02/25/19 1100 02/25/19 1950 02/26/19 0606 02/26/19 0905  BP: 115/79 132/73 (!) 143/63 (!) 108/41  Pulse: 64 65 60 64  Resp: 18     Temp: 98.6 F (37 C) 97.8 F (36.6 C) 98 F (36.7 C)   TempSrc: Oral Oral Oral   SpO2: 100% 98% 95%   Weight:   67.9 kg   Height:        Intake/Output Summary (Last 24 hours) at 02/26/2019 1131 Last data filed at 02/26/2019 0700 Gross per 24 hour  Intake 120 ml  Output 0 ml  Net 120 ml   Filed Weights   02/22/19 0647 02/26/19 0606  Weight: 68 kg 67.9 kg    Examination:  General exam: Appears calm and comfortable  Respiratory system: Clear to auscultation. Respiratory effort normal. Cardiovascular system: S1 & S2 heard, RRR. No JVD, murmurs, rubs, gallops or clicks. No pedal edema. Gastrointestinal system: Abdomen is nondistended, soft and nontender. No organomegaly or masses felt. Normal bowel sounds heard. Central nervous system: Alert and oriented. No focal neurological deficits. Extremities: Limited range of motion secondary to pain, no contractures warm well  perfused.. Skin: No rashes, lesions or ulcers Psychiatry: Judgement and insight appear normal. Mood & affect appropriate.     Data Reviewed: I have personally reviewed following labs and imaging studies  CBC: Recent Labs  Lab 02/22/19 0008 02/22/19 0436 02/22/19 0500 02/23/19 1034  WBC 12.5* 9.9   9.8 DUP SEE H7204 9.2  NEUTROABS 10.5* 7.7 PENDING  --   HGB 11.6* 10.3*   10.2* DUP SEE H7204 11.1*  HCT 34.0* 29.9*   29.4* DUP SEE H7204 32.8*  MCV 91.2 89.8   89.1 DUP SEE H7204 91.1  PLT PLATELET CLUMPS NOTED ON SMEAR, UNABLE TO  ESTIMATE 131*   140* DUP SEE H7204 628*   Basic Metabolic Panel: Recent Labs  Lab 02/22/19 0008 02/22/19 0436  NA 132* 132*  K 4.5 4.3  CL 100 101  CO2 22 23  GLUCOSE 123* 127*  BUN 10 11  CREATININE 1.30* 1.21*  CALCIUM 8.7* 8.6*   GFR: Estimated Creatinine Clearance: 26.9 mL/min (A) (by C-G formula based on SCr of 1.21 mg/dL (H)). Liver Function Tests: Recent Labs  Lab 02/22/19 0008  AST 29  ALT 12  ALKPHOS 55  BILITOT 0.9  PROT 6.9  ALBUMIN 3.8   No results for input(s): LIPASE, AMYLASE in the last 168 hours. No results for input(s): AMMONIA in the last 168 hours. Coagulation Profile: Recent Labs  Lab 02/22/19 0436 02/23/19 0800 02/24/19 0627 02/25/19 0659 02/26/19 0641  INR 2.1* 1.6* 1.6* 2.1* 2.1*   Cardiac Enzymes: No results for input(s): CKTOTAL, CKMB, CKMBINDEX, TROPONINI in the last 168 hours. BNP (last 3 results) No results for input(s): PROBNP in the last 8760 hours. HbA1C: No results for input(s): HGBA1C in the last 72 hours. CBG: No results for input(s): GLUCAP in the last 168 hours. Lipid Profile: No results for input(s): CHOL, HDL, LDLCALC, TRIG, CHOLHDL, LDLDIRECT in the last 72 hours. Thyroid Function Tests: No results for input(s): TSH, T4TOTAL, FREET4, T3FREE, THYROIDAB in the last 72 hours. Anemia Panel: No results for input(s): VITAMINB12, FOLATE, FERRITIN, TIBC, IRON, RETICCTPCT in the last 72 hours. Sepsis Labs: No results for input(s): PROCALCITON, LATICACIDVEN in the last 168 hours.  Recent Results (from the past 240 hour(s))  SARS Coronavirus 2 (CEPHEID - Performed in [facility] hospital lab), Hosp Order     Status: None   Collection Time: 02/22/19 12:14 AM  Result Value Ref Range Status   SARS Coronavirus 2 NEGATIVE NEGATIVE Final    Comment: (NOTE) If result is NEGATIVE SARS-CoV-2 target nucleic acids are NOT DETECTED. The SARS-CoV-2 RNA is generally detectable in upper and lower  respiratory specimens during the acute  phase of infection. The lowest  concentration of SARS-CoV-2 viral copies this assay can detect is 250  copies / mL. A negative result does not preclude SARS-CoV-2 infection  and should not be used as the sole basis for treatment or other  patient management decisions.  A negative result may occur with  improper specimen collection / handling, submission of specimen other  than nasopharyngeal swab, presence of viral mutation(s) within the  areas targeted by this assay, and inadequate number of viral copies  (<250 copies / mL). A negative result must be combined with clinical  observations, patient history, and epidemiological information. If result is POSITIVE SARS-CoV-2 target nucleic acids are DETECTED. The SARS-CoV-2 RNA is generally detectable in upper and lower  respiratory specimens dur ing the acute phase of infection.  Positive  results are indicative of active infection with SARS-CoV-2.  Clinical  correlation with patient history and other diagnostic information is  necessary to determine patient infection status.  Positive results do  not rule out bacterial infection or co-infection with other viruses. If result is PRESUMPTIVE POSTIVE SARS-CoV-2 nucleic acids MAY BE PRESENT.   A presumptive positive result was obtained on the submitted specimen  and confirmed on repeat testing.  While 2019 novel coronavirus  (SARS-CoV-2) nucleic acids may be present in the submitted sample  additional confirmatory testing may be necessary for epidemiological  and / or clinical management purposes  to differentiate between  SARS-CoV-2 and other Sarbecovirus currently known to infect humans.  If clinically indicated additional testing with an alternate test  methodology [phone]) is advised. The SARS-CoV-2 RNA is generally  detectable in upper and lower respiratory sp ecimens during the acute  phase of infection. The expected result is Negative. Fact Sheet for Patients:   StrictlyIdeas.no Fact Sheet for Healthcare Providers: BankingDealers.co.za This test is not yet approved or cleared by the [place] FDA and has been authorized for detection and/or diagnosis of SARS-CoV-2 by FDA under an Emergency Use Authorization (EUA).  This EUA will remain in effect (meaning this test can be used) for the duration of the COVID-19 declaration under Section 564(b)(1) of the Act, 21 U.S.C. section 360bbb-3(b)(1), unless the authorization is terminated or revoked sooner. Performed at [facility], [place] [address] [place], [place] [address]          Radiology Studies: No results found.      Scheduled Meds:  metoprolol tartrate  75 mg Oral BID   warfarin  5 mg Oral ONCE-1800   Warfarin - Pharmacist Dosing Inpatient   Does not apply q1800   Continuous Infusions:   LOS: 4 days    Time spent: [place]     Nicolette Bang, MD [facility]  If 7PM-7AM, please contact night-coverage  02/26/2019, 11:31 AM

## 2019-02-26 NOTE — Telephone Encounter (Signed)
This encounter was created in error - please disregard.

## 2019-02-26 NOTE — Progress Notes (Signed)
Physical Therapy Treatment Patient Details Name: Erica Carroll MRN: [id number] DOB: 12-25-1921 Today's Date: 02/26/2019    History of Present Illness 85 yo s/p fall at home with Right pubic rami fx. PMHx: AFib, CKD, HOH, mild dementia, HTN    PT Comments    Pt pleasant in chair with daughter present. Pt with increased activity tolerance, gait and ability to perform HEP. Pt required AAROM for all movement right hip in sitting but tolerating well. Daughter considering possible return home with family assist if family does not approve of any available SNF. Daughter educated for transfers and care required for return home with desire to have pt be minguard level for mobility and increased gait prior to return home. Daughter and pt educated for HEP.     Follow Up Recommendations  SNF;Supervision/Assistance - 24 hour     Equipment Recommendations  Rolling walker with 5" wheels;3in1 (PT)    Recommendations for Other Services       Precautions / Restrictions Precautions Precautions: Fall Restrictions Weight Bearing Restrictions: Yes RLE Weight Bearing: Weight bearing as tolerated LLE Weight Bearing: Weight bearing as tolerated    Mobility  Bed Mobility               General bed mobility comments: in chair on arrival   Transfers Overall transfer level: Needs assistance   Transfers: Sit to/from Stand Sit to Stand: Min assist;+2 safety/equipment         General transfer comment: cues for hand placement with assist to translate anteriorly and rise and lower   Ambulation/Gait Ambulation/Gait assistance: Min assist;+2 safety/equipment Gait Distance (Feet): 22 Feet Assistive device: Rolling walker (2 wheeled) Gait Pattern/deviations: Step-to pattern;Trunk flexed;Decreased stance time - right   Gait velocity interpretation: <1.8 ft/sec, indicate of risk for recurrent falls General Gait Details: pt with cues for sequence, posture and position in RW. pt with increased step  length and speed with increased distance but limited by pain with chair follow   Stairs             Wheelchair Mobility    Modified Rankin (Stroke Patients Only)       Balance Overall balance assessment: History of Falls;Needs assistance         Standing balance support: Bilateral upper extremity supported;During functional activity Standing balance-Leahy Scale: Poor                              Cognition Arousal/Alertness: Awake/alert Behavior During Therapy: WFL for tasks assessed/performed Overall Cognitive Status: Impaired/Different from baseline Area of Impairment: Following commands;Safety/judgement;Problem solving                 Orientation Level: Disoriented to;Situation   Memory: Decreased short-term memory Following Commands: Follows one step commands with increased time Safety/Judgement: Decreased awareness of safety;Decreased awareness of deficits   Problem Solving: Slow processing;Difficulty sequencing;Requires verbal cues;Requires tactile cues;Decreased initiation        Exercises General Exercises - Lower Extremity Long Arc Quad: AROM;20 reps;Both Hip ABduction/ADduction: AROM;AAROM;Right;Left;15 reps;Seated(AAROM on RLE) Hip Flexion/Marching: AAROM;AROM;15 reps;Seated;Right;Left(AAROM on RLE)    General Comments        Pertinent Vitals/Pain Pain Score: 5  Pain Location: right pelvis with movement, no pain at rest Pain Descriptors / Indicators: Grimacing;Guarding;Discomfort Pain Intervention(s): Limited activity within patient's tolerance;Premedicated before session;Monitored during session;Repositioned    Home Living  Prior Function            PT Goals (current goals can now be found in the care plan section) Progress towards PT goals: Progressing toward goals    Frequency    Min 4X/week      PT Plan Current plan remains appropriate    Co-evaluation               AM-PAC PT "6 Clicks" Mobility   Outcome Measure  Help needed turning from your back to your side while in a flat bed without using bedrails?: A Lot Help needed moving from lying on your back to sitting on the side of a flat bed without using bedrails?: A Lot Help needed moving to and from a bed to a chair (including a wheelchair)?: A Little Help needed standing up from a chair using your arms (e.g., wheelchair or bedside chair)?: A Little Help needed to walk in hospital room?: A Little Help needed climbing 3-5 steps with a railing? : A Lot 6 Click Score: 15    End of Session Equipment Utilized During Treatment: Gait belt Activity Tolerance: Patient tolerated treatment well Patient left: in chair;with call bell/phone within reach;with family/visitor present Nurse Communication: Mobility status PT Visit Diagnosis: Other abnormalities of gait and mobility (R26.89);Muscle weakness (generalized) (M62.81);Pain;History of falling (Z91.81)     Time: 4388-8757 PT Time Calculation (min) (ACUTE ONLY): 18 min  Charges:  $Gait Training: 8-22 mins                     Tameaka Eichhorn Pam Drown, PT Acute Rehabilitation Services Pager: [phone] Office: [phone]    Sandy Salaam Leesa Leifheit 02/26/2019, 1:03 PM

## 2019-02-26 NOTE — Progress Notes (Signed)
[place] for warfarin Indication: atrial fibrillation  Allergies  Allergen Reactions  . Other Other (See Comments)    News Print: (freshly printed newspapers) cause her eyes to water . Allergy type reaction. Medal: itching & rash    Vital Signs: Temp: 98 F (36.7 C) (06/01 0606) Temp Source: Oral (06/01 0606) BP: 143/63 (06/01 0606) Pulse Rate: 60 (06/01 0606)   Assessment: 85yo female presents after fall sustaining pubic rami fracture - no operative management. Pharmacy consulted to manage warfarin inpatient.  INR remains therapeutic at 2.1, H/H improved, no bleeding reported  Home regimen: 5 mg daily except 2.5 mg Thur/Sun  Goal of Therapy:  INR 2-3   Plan:  Give warfarin 5mg  PO x1 tonight Daily INR, s/s bleeding  Bertis Ruddy, PharmD Clinical Pharmacist Please check AMION for all [place] numbers 02/26/2019 8:34 AM

## 2019-02-26 NOTE — TOC Progression Note (Addendum)
Transition of Care [facility]) - Progression Note    Patient Details  Name: Erica Carroll MRN: [id number] Date of Birth: September 16, 1922  Transition of Care [facility]) CM/SW [place], LCSW Phone Number: 02/26/2019, 2:04 PM  Clinical Narrative: [facility] has declined bed offer. Provided daughter with list of bed offers. [facility] will not have a bed until Thursday and have not said if they can take her or not. Daughter concerned with scores of those that have offered and will research others. She is open to other counties since they can't visit anyway. She also asked questions about home health. All questions answered. She will call/text CSW with decision. MD will enter COVID test order.  3:55 pm: Called and gave daughter newest bed offers: [facility] in [place], [place], [facility], [place], and [place]. She will review.  Expected Discharge Plan: Skilled Nursing Facility Barriers to Discharge: Ship broker, Continued Medical Work up  Expected Discharge Plan and Services Expected Discharge Plan: [place] arrangements for the past 2 months: Single Family Home                                       Social Determinants of Health (SDOH) Interventions    Readmission Risk Interventions No flowsheet data found.

## 2019-02-26 NOTE — Plan of Care (Signed)

## 2019-02-27 ENCOUNTER — Telehealth: Payer: Self-pay | Admitting: *Deleted

## 2019-02-27 LAB — NOVEL CORONAVIRUS, NAA (HOSP ORDER, SEND-OUT TO REF LAB; TAT 18-24 HRS): SARS-CoV-2, NAA: NOT DETECTED

## 2019-02-27 LAB — PROTIME-INR
INR: 2.3 — ABNORMAL HIGH (ref 0.8–1.2)
Prothrombin Time: 25.3 seconds — ABNORMAL HIGH (ref 11.4–15.2)

## 2019-02-27 MED ORDER — WARFARIN SODIUM 5 MG PO TABS
5.0000 mg | ORAL_TABLET | Freq: Once | ORAL | Status: AC
  Administered 2019-02-27: 5 mg via ORAL
  Filled 2019-02-27: qty 1

## 2019-02-27 NOTE — Progress Notes (Addendum)
Patient suffers from right pubic rami fx which impairs their ability to perform daily activities like transferring and walking in the home.  A walker alone will not resolve the issues with performing activities of daily living. A wheelchair will allow patient to safely perform daily activities.  The patient has a caregiver who can provide assistance for management and propulsion of wheelchair.   Wheel chair will also allow for transport into and out of home due to stairs. Pt age and frailty will require lightweight chair with cushion.  Elwyn Reach, PT Acute Rehabilitation Services Pager: [phone] Office: [phone]

## 2019-02-27 NOTE — Progress Notes (Signed)
PROGRESS NOTE    Erica Carroll  ATF:[id number] DOB: [birth year] DOA: 02/21/2019 PCP: Unk Pinto, MD   Brief Narrative:  Erica Carroll a 85 y.o.femalewithhistory of atrial fibrillation on metoprolol and Coumadin, sinus bradycardia status post pacemaker placement, chronic kidney disease stage III, chronic anemia mild dementia had a fall at home while walking. As per the daughter patient likely tripped on something in the hall and fell. Denies losing consciousness denies any chest pain nausea vomiting or diarrhea. Has been having pain in the right hip pelvic area.  ED Course:X-rays in the ER shows a right possible acute fractures of the right superior and inferior pubic rami. CT head and C-spine were unremarkable. EKG shows normal sinus rhythm with T wave changes which are comparableto the old EKG. Labs show anemia and mild hyponatremia and creatinine elevated at baseline. Patient has significant pain for which patient was given fentanyl and admitted for further observation.   Assessment & Plan:   Principal Problem:   Pelvic fracture (HCC) Active Problems:   Paroxysmal atrial fibrillation (HCC)   CKD stage G3b/A1, GFR 30-44 and albumin creatinine ratio <30 mg/g (HCC)   Mild dementia (HCC)   Bilateral fracture of pubic rami (HCC)   1. Acute fracture involving the right superior and inferior pubic rami status post mechanical fall-continue analgesicsphysical therapy consultshowed patient was unable to safely ambulate, needing 2 person assist to stand and pivot. Patient unsafe to discharge home high fall risk with worsening injury at rest.Seen by orthopedics nonoperative at this point.Patient was declined for rehab, family interested in pursuing skilled nursing facility, social work pursuing-unfortunately unable to locate a facility patient's family is comfortable with.  Family interested in pursuing discharge home with home health but have requested an additional  day to arrange for patient to stay with them in [place]. 2. A. fib on metoprolol and Coumadin.Continue nodal blocking agents, Coumadin to be dosed per pharmacy. 3. Chronic kidney disease stage III creatinine appears to be at baseline.Will monitor electrolytes 4. Chronic anemia likely from renal disease appears to be at baseline. Follow CBC. 5. Mild hyponatremia follow metabolic panel.. 6. Status post pacemaker placement for sinus bradycardia. Interrogation was done and reported to be normal no evidence of arrhythmia  DVT prophylaxis: YH:[id number]  Code Status: full    Code Status Orders  (From admission, onward)         Start     Ordered   02/22/19 0011  Do not attempt resuscitation (DNR)  Continuous    Question Answer Comment  In the event of cardiac or respiratory ARREST Do not call a code blue   In the event of cardiac or respiratory ARREST Do not perform Intubation, CPR, defibrillation or ACLS   In the event of cardiac or respiratory ARREST Use medication by any route, position, wound care, and other measures to relive pain and suffering. May use oxygen, suction and manual treatment of airway obstruction as needed for comfort.      02/22/19 0011        Code Status History    Date Active Date Inactive Code Status Order ID Comments User Context   02/22/2019 0007 02/22/2019 0011 DNR [id number]  Rise Patience, MD ED   02/12/2015 1702 02/13/2015 1846 Full Code [id number]  Deboraha Sprang, MD Inpatient   02/12/2015 1343 02/12/2015 1702 Full Code [id number]  Melina Schools, NP Inpatient   01/28/2015 1801 01/29/2015 1817 Full Code [id number]  Sueanne Margarita, MD Inpatient   07/10/2013 0945  07/14/2013 1420 Full Code [id number]  Oswald Hillock, MD Inpatient   06/25/2013 2320 06/27/2013 2055 Full Code [id number]  Orvan Falconer, MD Inpatient     Family Communication: Daughter who is at bedside Disposition Plan:   Patient will remain in the hospital 1 additional day in anticipation of  discharge home now family resisted discharge home today indicating they need 1 day to prepare the home where she will be staying in the interim. Consults called: Orthopedics Admission status: Inpatient   Consultants:   As above  Procedures:  Dg Lumbar Spine Complete  Result Date: 02/21/2019 CLINICAL DATA:  Status post fall, back pain EXAM: LUMBAR SPINE - COMPLETE 4+ VIEW COMPARISON:  None. FINDINGS: There are 5 nonrib bearing lumbar-type vertebral bodies. There is generalized osteopenia. The vertebral body heights are maintained. There is grade 1 anterolisthesis of L4 on L5 and L5 on S1. There is 3 mm retrolisthesis of L3 on L4. There is no spondylolysis. There is no acute fracture. There is degenerative disc disease with disc height loss at L3-4, L4-5 and L5-S1. There is degenerative disc disease with disc height loss at L1-2. The SI joints are unremarkable. IMPRESSION: No acute osseous injury of the lumbar spine. Electronically Signed   By: Kathreen Devoid   On: 02/21/2019 23:36   Ct Head Wo Contrast  Result Date: 02/21/2019 CLINICAL DATA:  Recent fall while on blood thinners EXAM: CT HEAD WITHOUT CONTRAST CT CERVICAL SPINE WITHOUT CONTRAST TECHNIQUE: Multidetector CT imaging of the head and cervical spine was performed following the standard protocol without intravenous contrast. Multiplanar CT image reconstructions of the cervical spine were also generated. COMPARISON:  01/28/2015 FINDINGS: CT HEAD FINDINGS Brain: No evidence of acute infarction, hemorrhage, hydrocephalus, extra-axial collection or mass lesion/mass effect. Mild atrophic changes are noted. Vascular: No hyperdense vessel or unexpected calcification. Skull: Normal. Negative for fracture or focal lesion. Sinuses/Orbits: No acute finding. Other: None. CT CERVICAL SPINE FINDINGS Alignment: Within normal limits. Skull base and vertebrae: 7 cervical segments are well visualized. Vertebral body height is well maintained. Facet hypertrophic  changes are noted at multiple levels. No acute fracture or acute facet abnormality is noted. Soft tissues and spinal canal: Surrounding soft tissue structures are within normal limits. Upper chest: Visualized lung apices are unremarkable. Other: None IMPRESSION: CT of the head: Atrophic changes without acute abnormality. CT of cervical spine: Multilevel degenerative change without acute abnormality. Electronically Signed   By: Inez Catalina M.D.   On: 02/21/2019 23:11   Ct Cervical Spine Wo Contrast  Result Date: 02/21/2019 CLINICAL DATA:  Recent fall while on blood thinners EXAM: CT HEAD WITHOUT CONTRAST CT CERVICAL SPINE WITHOUT CONTRAST TECHNIQUE: Multidetector CT imaging of the head and cervical spine was performed following the standard protocol without intravenous contrast. Multiplanar CT image reconstructions of the cervical spine were also generated. COMPARISON:  01/28/2015 FINDINGS: CT HEAD FINDINGS Brain: No evidence of acute infarction, hemorrhage, hydrocephalus, extra-axial collection or mass lesion/mass effect. Mild atrophic changes are noted. Vascular: No hyperdense vessel or unexpected calcification. Skull: Normal. Negative for fracture or focal lesion. Sinuses/Orbits: No acute finding. Other: None. CT CERVICAL SPINE FINDINGS Alignment: Within normal limits. Skull base and vertebrae: 7 cervical segments are well visualized. Vertebral body height is well maintained. Facet hypertrophic changes are noted at multiple levels. No acute fracture or acute facet abnormality is noted. Soft tissues and spinal canal: Surrounding soft tissue structures are within normal limits. Upper chest: Visualized lung apices are unremarkable. Other: None IMPRESSION: CT of the  head: Atrophic changes without acute abnormality. CT of cervical spine: Multilevel degenerative change without acute abnormality. Electronically Signed   By: Inez Catalina M.D.   On: 02/21/2019 23:11   Dg Pelvis Portable  Result Date:  02/21/2019 CLINICAL DATA:  Fall, question hip fracture EXAM: PORTABLE PELVIS 1-2 VIEWS COMPARISON:  None. FINDINGS: Coarse calcifications in the pelvis. Pubic symphysis is intact. Both femoral heads project in joint. There are mild degenerative changes. Possible fracture lucencies at the right superior and inferior pubic rami. IMPRESSION: Possible acute fractures involving the right superior and inferior pubic rami. Electronically Signed   By: Donavan Foil M.D.   On: 02/21/2019 23:01   Dg Pelvis Comp Min 3v  Result Date: 02/22/2019 CLINICAL DATA:  Pelvic fracture EXAM: JUDET PELVIS - 3+ VIEW COMPARISON:  the previous day's study FINDINGS: Minimally displaced fractures of the right superior ischial ramus and inferior ischiopubic ramus. No other pelvic ring fracture is identified. There is mild narrowing of articular cartilage in bilateral hips. No convincing femoral or acetabular fracture. No dislocation. Bilateral pelvic phleboliths. Mild spondylitic changes in the visualized lower lumbar spine. IMPRESSION: Minimally displaced right superior and inferior ischial ramus fractures. Electronically Signed   By: Lucrezia Europe M.D.   On: 02/22/2019 09:48   Dg Chest Port 1 View  Result Date: 02/21/2019 CLINICAL DATA:  Fall EXAM: PORTABLE CHEST 1 VIEW COMPARISON:  04/25/2015 FINDINGS: Left-sided pacing device with leads over right atrium and right ventricle. Mild cardiomegaly with aortic atherosclerosis. No acute opacity, pleural effusion or pneumothorax. Old right-sided rib fractures. IMPRESSION: No active disease.  Mild cardiomegaly Electronically Signed   By: Donavan Foil M.D.   On: 02/21/2019 22:59     Antimicrobials:   None   Subjective: Patient pain is improved.  She was able to ambulate with daughter's assistance with walker.  Objective: Vitals:   02/26/19 2012 02/27/19 0544 02/27/19 0856 02/27/19 1139  BP: (!) 131/54 132/60  129/60  Pulse: 66 60  62  Resp:    18  Temp:  98.4 F (36.9 C)   98.5 F (36.9 C)  TempSrc:  Oral  Oral  SpO2:  97%  98%  Weight:   67.6 kg   Height:        Intake/Output Summary (Last 24 hours) at 02/27/2019 1141 Last data filed at 02/27/2019 1038 Gross per 24 hour  Intake 560 ml  Output 600 ml  Net -40 ml   Filed Weights   02/22/19 0647 02/26/19 0606 02/27/19 0856  Weight: 68 kg 67.9 kg 67.6 kg    Examination:  General exam: Appears calm and comfortable  Respiratory system: Clear to auscultation. Respiratory effort normal. Cardiovascular system: S1 & S2 heard, RRR. No JVD, murmurs, rubs, gallops or clicks. No pedal edema. Gastrointestinal system: Abdomen is nondistended, soft and nontender. No organomegaly or masses felt. Normal bowel sounds heard. Central nervous system: Alert and oriented. No focal neurological deficits. Extremities:  improving range of motion secondary to pain, no contractures warm well perfused.. Skin: No rashes, lesions or ulcers Psychiatry: Judgement and insight limited secondary to dementia but no acute changes. Mood & affect appropriate.     Data Reviewed: I have personally reviewed following labs and imaging studies  CBC: Recent Labs  Lab 02/22/19 0008 02/22/19 0436 02/22/19 0500 02/23/19 1034  WBC 12.5* 9.9   9.8 DUP SEE [id number] 9.2  NEUTROABS 10.5* 7.7 PENDING  --   HGB 11.6* 10.3*   10.2* DUP SEE [id number] 11.1*  HCT 34.0* 29.9*  29.4* DUP SEE H7204 32.8*  MCV 91.2 89.8   89.1 DUP SEE H7204 91.1  PLT PLATELET CLUMPS NOTED ON SMEAR, UNABLE TO ESTIMATE 131*   140* DUP SEE H7204 017*   Basic Metabolic Panel: Recent Labs  Lab 02/22/19 0008 02/22/19 0436  NA 132* 132*  K 4.5 4.3  CL 100 101  CO2 22 23  GLUCOSE 123* 127*  BUN 10 11  CREATININE 1.30* 1.21*  CALCIUM 8.7* 8.6*   GFR: Estimated Creatinine Clearance: 26.9 mL/min (A) (by C-G formula based on SCr of 1.21 mg/dL (H)). Liver Function Tests: Recent Labs  Lab 02/22/19 0008  AST 29  ALT 12  ALKPHOS 55  BILITOT 0.9  PROT 6.9  ALBUMIN 3.8    No results for input(s): LIPASE, AMYLASE in the last 168 hours. No results for input(s): AMMONIA in the last 168 hours. Coagulation Profile: Recent Labs  Lab 02/23/19 0800 02/24/19 0627 02/25/19 0659 02/26/19 0641 02/27/19 0602  INR 1.6* 1.6* 2.1* 2.1* 2.3*   Cardiac Enzymes: No results for input(s): CKTOTAL, CKMB, CKMBINDEX, TROPONINI in the last 168 hours. BNP (last 3 results) No results for input(s): PROBNP in the last 8760 hours. HbA1C: No results for input(s): HGBA1C in the last 72 hours. CBG: No results for input(s): GLUCAP in the last 168 hours. Lipid Profile: No results for input(s): CHOL, HDL, LDLCALC, TRIG, CHOLHDL, LDLDIRECT in the last 72 hours. Thyroid Function Tests: No results for input(s): TSH, T4TOTAL, FREET4, T3FREE, THYROIDAB in the last 72 hours. Anemia Panel: No results for input(s): VITAMINB12, FOLATE, FERRITIN, TIBC, IRON, RETICCTPCT in the last 72 hours. Sepsis Labs: No results for input(s): PROCALCITON, LATICACIDVEN in the last 168 hours.  Recent Results (from the past 240 hour(s))  SARS Coronavirus 2 (CEPHEID - Performed in [facility] hospital lab), Hosp Order     Status: None   Collection Time: 02/22/19 12:14 AM  Result Value Ref Range Status   SARS Coronavirus 2 NEGATIVE NEGATIVE Final    Comment: (NOTE) If result is NEGATIVE SARS-CoV-2 target nucleic acids are NOT DETECTED. The SARS-CoV-2 RNA is generally detectable in upper and lower  respiratory specimens during the acute phase of infection. The lowest  concentration of SARS-CoV-2 viral copies this assay can detect is 250  copies / mL. A negative result does not preclude SARS-CoV-2 infection  and should not be used as the sole basis for treatment or other  patient management decisions.  A negative result may occur with  improper specimen collection / handling, submission of specimen other  than nasopharyngeal swab, presence of viral mutation(s) within the  areas targeted by this assay,  and inadequate number of viral copies  (<250 copies / mL). A negative result must be combined with clinical  observations, patient history, and epidemiological information. If result is POSITIVE SARS-CoV-2 target nucleic acids are DETECTED. The SARS-CoV-2 RNA is generally detectable in upper and lower  respiratory specimens dur ing the acute phase of infection.  Positive  results are indicative of active infection with SARS-CoV-2.  Clinical  correlation with patient history and other diagnostic information is  necessary to determine patient infection status.  Positive results do  not rule out bacterial infection or co-infection with other viruses. If result is PRESUMPTIVE POSTIVE SARS-CoV-2 nucleic acids MAY BE PRESENT.   A presumptive positive result was obtained on the submitted specimen  and confirmed on repeat testing.  While 2019 novel coronavirus  (SARS-CoV-2) nucleic acids may be present in the submitted sample  additional confirmatory  testing may be necessary for epidemiological  and / or clinical management purposes  to differentiate between  SARS-CoV-2 and other Sarbecovirus currently known to infect humans.  If clinically indicated additional testing with an alternate test  methodology [phone]) is advised. The SARS-CoV-2 RNA is generally  detectable in upper and lower respiratory sp ecimens during the acute  phase of infection. The expected result is Negative. Fact Sheet for Patients:  StrictlyIdeas.no Fact Sheet for Healthcare Providers: BankingDealers.co.za This test is not yet approved or cleared by the [place] FDA and has been authorized for detection and/or diagnosis of SARS-CoV-2 by FDA under an Emergency Use Authorization (EUA).  This EUA will remain in effect (meaning this test can be used) for the duration of the COVID-19 declaration under Section 564(b)(1) of the Act, 21 U.S.C. section 360bbb-3(b)(1), unless  the authorization is terminated or revoked sooner. Performed at [facility], [place] [address], [place], [place] [address]          Radiology Studies: No results found.      Scheduled Meds:  metoprolol tartrate  75 mg Oral BID   Warfarin - Pharmacist Dosing Inpatient   Does not apply q1800   Continuous Infusions:   LOS: 5 days    Time spent: 35 min    Nicolette Bang, MD [facility]  If 7PM-7AM, please contact night-coverage  02/27/2019, 11:41 AM

## 2019-02-27 NOTE — Progress Notes (Signed)
Patient in bed resting comfortably, daughter at bedside.

## 2019-02-27 NOTE — Progress Notes (Signed)
Occupational Therapy Treatment Patient Details Name: Erica Carroll MRN: [id number] DOB: 1921-11-03 Today's Date: 02/27/2019    History of present illness 85 yo s/p fall at home with Right pubic rami fx. PMHx: AFib, CKD, HOH, mild dementia, HTN   OT comments  Patient in bed upon therapy arrival and daughter present and agreeable to participate in OT treatment session. Patient is to be discharged tomorrow morning. Session focused on ADL education with daughter. Reviewed equipment she is receiving and problem solved any issues that may arrive. Family training completed with daughter during functional transfer from bed to recliner. Gait belt was used with education given. Daughter returned education and voices increased comfort with transfer. Encouraged her to continue to complete transfers with nursing at stand by in case she needs assistance. All education complete. No further OT needs at this time. No OT follow up at discharge. OT will sign off.    Follow Up Recommendations  No OT follow up    Equipment Recommendations  None recommended by OT       Precautions / Restrictions Precautions Precautions: Fall Restrictions Weight Bearing Restrictions: Yes RLE Weight Bearing: Weight bearing as tolerated LLE Weight Bearing: Weight bearing as tolerated       Mobility Bed Mobility Overal bed mobility: Needs Assistance Bed Mobility: Supine to Sit     Supine to sit: Mod assist;HOB elevated     General bed mobility comments: daughter completing with therapist present  Transfers Overall transfer level: Needs assistance Equipment used: Rolling walker (2 wheeled) Transfers: Sit to/from Stand Sit to Stand: Mod assist Stand pivot transfers: Min assist       General transfer comment: Daughter completing with therapist present. Education provided on use of gait belt during functional transfers.     Balance Overall balance assessment: History of Falls;Needs assistance Sitting-balance  support: Bilateral upper extremity supported;Feet supported Sitting balance-Leahy Scale: Fair     Standing balance support: Bilateral upper extremity supported;During functional activity Standing balance-Leahy Scale: Poor Standing balance comment: bil UE support for gait           ADL either performed or assessed with clinical judgement   ADL  Toilet Transfer: Moderate assistance;RW;Ambulation Toilet Transfer Details (indicate cue type and reason): Simulated from bed to recliner with caregiver (daughter) completing.     General ADL Comments: Discussed ADL completion with daughter as patient will be discharged tomorrow with daughter assisting.      Vision Baseline Vision/History: No visual deficits Patient Visual Report: No change from baseline            Cognition Arousal/Alertness: Awake/alert Behavior During Therapy: WFL for tasks assessed/performed Overall Cognitive Status: Impaired/Different from baseline Area of Impairment: Following commands;Safety/judgement;Problem solving    Orientation Level: Disoriented to;Time   Memory: Decreased short-term memory Following Commands: Follows one step commands with increased time Safety/Judgement: Decreased awareness of safety;Decreased awareness of deficits   Problem Solving: Slow processing;Difficulty sequencing;Requires verbal cues;Requires tactile cues;Decreased initiation General Comments: Daughter present and patient looking to daughter to answer most questions        Exercises General Exercises - Lower Extremity Long Arc Quad: AROM;20 reps;Both Hip ABduction/ADduction: Right;15 reps;Seated;AAROM Hip Flexion/Marching: AROM;Seated;Both;20 reps           Pertinent Vitals/ Pain       Pain Assessment: No/denies pain Pain Score: 3  Pain Location: right pelvis with movement, no pain at rest Pain Descriptors / Indicators: Aching;Sore Pain Intervention(s): Limited activity within patient's tolerance;Monitored during  session;Repositioned;Premedicated before session  Progress Toward Goals  OT Goals(current goals can now be found in the care plan section)  Progress towards OT goals: Goals met/education completed, patient discharged from [place] goals met and education completed, patient discharged from OT services       AM-PAC OT "6 Clicks" Daily Activity     Outcome Measure   Help from another person eating meals?: None Help from another person taking care of personal grooming?: A Little Help from another person toileting, which includes using toliet, bedpan, or urinal?: Total Help from another person bathing (including washing, rinsing, drying)?: A Lot Help from another person to put on and taking off regular upper body clothing?: A Little Help from another person to put on and taking off regular lower body clothing?: Total 6 Click Score: 14    End of Session Equipment Utilized During Treatment: Gait belt;Rolling walker  OT Visit Diagnosis: Muscle weakness (generalized) (M62.81);History of falling (Z91.81)   Activity Tolerance Patient tolerated treatment well   Patient Left in chair;with family/visitor present;with call bell/phone within reach(daughter: Erica Carroll)   Nurse Communication          Time: 8675-4492 OT Time Calculation (min): 30 min  Charges: OT Treatments $Self Care/Home Management : 23-37 mins(30[place], OTR/L,CBIS  [phone]    , Clarene Duke 02/27/2019, 4:21 PM

## 2019-02-27 NOTE — Progress Notes (Signed)
[place] for warfarin Indication: atrial fibrillation  Allergies  Allergen Reactions  . Other Other (See Comments)    News Print: (freshly printed newspapers) cause her eyes to water . Allergy type reaction. Medal: itching & rash    Vital Signs: Temp: 98.5 F (36.9 C) (06/02 1139) Temp Source: Oral (06/02 1139) BP: 129/60 (06/02 1139) Pulse Rate: 62 (06/02 1139)   Assessment: 85yo female presents after fall sustaining pubic rami fracture - no operative management. Pharmacy consulted to manage warfarin inpatient.  INR remains therapeutic at 2.3 no bleeding reported.  Will continue on her home dosage regimen.   Home regimen: 5 mg daily except 2.5 mg Thur/Sun  Goal of Therapy:  INR 2-3   Plan:  Give warfarin 5mg  PO x1 tonight Daily INR, s/s bleeding  Threasa Alpha Clinical Pharmacist Please check AMION for all [place] numbers 02/27/2019 3:28 PM

## 2019-02-27 NOTE — Discharge Summary (Signed)
Physician Discharge Summary  Erica Carroll ZOX:[id number] DOB: 1922-01-21 DOA: 02/21/2019  PCP: Erica Pinto, MD  Admit date: 02/21/2019 Discharge date: 02/27/2019  Admitted From: Inpatient Disposition: home  Recommendations for Outpatient Follow-up:  1. Follow up with PCP in 1-2 weeks 2. Please obtain BMP/CBC in one week 3. Please follow up on the following pending results:  Home Health:Yes Equipment/Devices:per PT  Discharge Condition:Stable CODE STATUS:DNR Diet recommendation: Regular healthy diet  Brief/Interim Summary: Per admission: Erica B Lakeis a 85 y.o.femalewithhistory of atrial fibrillation on metoprolol and Coumadin, sinus bradycardia status post pacemaker placement, chronic kidney disease stage III, chronic anemia mild dementia had a fall at home while walking. As per the daughter patient likely tripped on something in the hall and fell. Denies losing consciousness denies any chest pain nausea vomiting or diarrhea. Has been having pain in the right hip pelvic area.  ED Course:X-rays in the ER shows a right possible acute fractures of the right superior and inferior pubic rami. CT head and C-spine were unremarkable. EKG shows normal sinus rhythm with T wave changes which are comparableto the old EKG. Labs show anemia and mild hyponatremia and creatinine elevated at baseline. Patient has significant pain for which patient was given fentanyl and admitted for further observation.  Hospital course: 1. Acute fracture involving the right superior and inferior pubic rami status post mechanical fall-continue analgesicsphysical therapy consultshowed patient was unable tosafelyambulate, needing2 person assist to stand and pivot. Patient was unsafe to discharge home high fall risk with worsening injury at rest.Seen by orthopedics nonoperative at this point.Patient was declined for rehab, family interested in pursuing skilled nursing facility, and social  work was pursuing-unfortunately unable to locate a facility patient's family is comfortable with.  Family interested in pursuing discharge home with home health.  2.  A. fib on metoprolol and Coumadin.Continue nodal blocking agents, Coumadin to be dosed per pharmacy. 3. Chronic kidney disease stage III creatinine appears to be at baseline.Will monitor electrolytes 4. Chronic anemia likely from renal disease appears to be at baseline. Follow CBC. 5. Mild hyponatremia follow metabolic panel.. 6. Status post pacemaker placement for sinus bradycardia. Interrogation was done and reported to be normal no evidence of arrhythmia   Discharge Diagnoses:  Principal Problem:   Pelvic fracture (HCC) Active Problems:   Paroxysmal atrial fibrillation (HCC)   CKD stage G3b/A1, GFR 30-44 and albumin creatinine ratio <30 mg/g (HCC)   Mild dementia ([place])   Bilateral fracture of pubic rami [facility])    Discharge Instructions   Allergies as of 02/27/2019      Reactions   Other Other (See Comments)   News Print: (freshly printed newspapers) cause her eyes to water . Allergy type reaction. Medal: itching & rash      Medication List    TAKE these medications   acetaminophen 325 MG tablet Commonly known as:  TYLENOL Take 325 mg by mouth every 6 (six) hours as needed for mild pain.   b complex vitamins tablet Take 1 tablet by mouth daily.   diltiazem 30 MG tablet Commonly known as:  Cardizem Take 1 tablet (30 mg total) by mouth daily as needed. For Afib episodes   GLUCOSAMINE-CHONDROITIN PO Take 1 tablet by mouth daily.   MAGNESIUM OXIDE (ANTACID) PO Take 1 tablet by mouth daily.   metoprolol tartrate 25 MG tablet Commonly known as:  LOPRESSOR Take 3 tablets (75 mg) by mouth twice daily. Please schedule an appt for further refills What changed:    how much  to take  how to take this  when to take this  additional instructions   OVER THE COUNTER MEDICATION Take 1 tablet by  mouth daily. Preserve vision vitamin   polyethylene glycol 17 g packet Commonly known as:  MIRALAX / GLYCOLAX Take 17 g by mouth daily as needed (constipation).   VITAMIN D PO Take 3,000 Units by mouth daily.   warfarin 5 MG tablet Commonly known as:  COUMADIN Take as directed. If you are unsure how to take this medication, talk to your nurse or doctor. Original instructions:  Take 1/2 to 1 tablet daily as directed by coumadin clinic. What changed:    how much to take  how to take this  when to take this  additional instructions      Follow-up Information    Carroll, Erica Lot, MD. Schedule an appointment as soon as possible for a visit in 1 month(s).   Specialty:  Orthopedic Surgery Contact information: [place] [address] [phone]          Allergies  Allergen Reactions  . Other Other (See Comments)    News Print: (freshly printed newspapers) cause her eyes to water . Allergy type reaction. Medal: itching & rash    Consultations:  orthopedics   Procedures/Studies: Dg Lumbar Spine Complete  Result Date: 02/21/2019 CLINICAL DATA:  Status post fall, back pain EXAM: LUMBAR SPINE - COMPLETE 4+ VIEW COMPARISON:  None. FINDINGS: There are 5 nonrib bearing lumbar-type vertebral bodies. There is generalized osteopenia. The vertebral body heights are maintained. There is grade 1 anterolisthesis of L4 on L5 and L5 on S1. There is 3 mm retrolisthesis of L3 on L4. There is no spondylolysis. There is no acute fracture. There is degenerative disc disease with disc height loss at L3-4, L4-5 and L5-S1. There is degenerative disc disease with disc height loss at L1-2. The SI joints are unremarkable. IMPRESSION: No acute osseous injury of the lumbar spine. Electronically Signed   By: Kathreen Devoid   On: 02/21/2019 23:36   Ct Head Wo Contrast  Result Date: 02/21/2019 CLINICAL DATA:  Recent fall while on blood thinners EXAM: CT HEAD WITHOUT CONTRAST CT CERVICAL  SPINE WITHOUT CONTRAST TECHNIQUE: Multidetector CT imaging of the head and cervical spine was performed following the standard protocol without intravenous contrast. Multiplanar CT image reconstructions of the cervical spine were also generated. COMPARISON:  01/28/2015 FINDINGS: CT HEAD FINDINGS Brain: No evidence of acute infarction, hemorrhage, hydrocephalus, extra-axial collection or mass lesion/mass effect. Mild atrophic changes are noted. Vascular: No hyperdense vessel or unexpected calcification. Skull: Normal. Negative for fracture or focal lesion. Sinuses/Orbits: No acute finding. Other: None. CT CERVICAL SPINE FINDINGS Alignment: Within normal limits. Skull base and vertebrae: 7 cervical segments are well visualized. Vertebral body height is well maintained. Facet hypertrophic changes are noted at multiple levels. No acute fracture or acute facet abnormality is noted. Soft tissues and spinal canal: Surrounding soft tissue structures are within normal limits. Upper chest: Visualized lung apices are unremarkable. Other: None IMPRESSION: CT of the head: Atrophic changes without acute abnormality. CT of cervical spine: Multilevel degenerative change without acute abnormality. Electronically Signed   By: Inez Catalina M.D.   On: 02/21/2019 23:11   Ct Cervical Spine Wo Contrast  Result Date: 02/21/2019 CLINICAL DATA:  Recent fall while on blood thinners EXAM: CT HEAD WITHOUT CONTRAST CT CERVICAL SPINE WITHOUT CONTRAST TECHNIQUE: Multidetector CT imaging of the head and cervical spine was performed following the standard protocol  without intravenous contrast. Multiplanar CT image reconstructions of the cervical spine were also generated. COMPARISON:  01/28/2015 FINDINGS: CT HEAD FINDINGS Brain: No evidence of acute infarction, hemorrhage, hydrocephalus, extra-axial collection or mass lesion/mass effect. Mild atrophic changes are noted. Vascular: No hyperdense vessel or unexpected calcification. Skull: Normal.  Negative for fracture or focal lesion. Sinuses/Orbits: No acute finding. Other: None. CT CERVICAL SPINE FINDINGS Alignment: Within normal limits. Skull base and vertebrae: 7 cervical segments are well visualized. Vertebral body height is well maintained. Facet hypertrophic changes are noted at multiple levels. No acute fracture or acute facet abnormality is noted. Soft tissues and spinal canal: Surrounding soft tissue structures are within normal limits. Upper chest: Visualized lung apices are unremarkable. Other: None IMPRESSION: CT of the head: Atrophic changes without acute abnormality. CT of cervical spine: Multilevel degenerative change without acute abnormality. Electronically Signed   By: Inez Catalina M.D.   On: 02/21/2019 23:11   Dg Pelvis Portable  Result Date: 02/21/2019 CLINICAL DATA:  Fall, question hip fracture EXAM: PORTABLE PELVIS 1-2 VIEWS COMPARISON:  None. FINDINGS: Coarse calcifications in the pelvis. Pubic symphysis is intact. Both femoral heads project in joint. There are mild degenerative changes. Possible fracture lucencies at the right superior and inferior pubic rami. IMPRESSION: Possible acute fractures involving the right superior and inferior pubic rami. Electronically Signed   By: Donavan Foil M.D.   On: 02/21/2019 23:01   Dg Pelvis Comp Min 3v  Result Date: 02/22/2019 CLINICAL DATA:  Pelvic fracture EXAM: JUDET PELVIS - 3+ VIEW COMPARISON:  the previous day's study FINDINGS: Minimally displaced fractures of the right superior ischial ramus and inferior ischiopubic ramus. No other pelvic ring fracture is identified. There is mild narrowing of articular cartilage in bilateral hips. No convincing femoral or acetabular fracture. No dislocation. Bilateral pelvic phleboliths. Mild spondylitic changes in the visualized lower lumbar spine. IMPRESSION: Minimally displaced right superior and inferior ischial ramus fractures. Electronically Signed   By: Lucrezia Europe M.D.   On: 02/22/2019  09:48   Dg Chest Port 1 View  Result Date: 02/21/2019 CLINICAL DATA:  Fall EXAM: PORTABLE CHEST 1 VIEW COMPARISON:  04/25/2015 FINDINGS: Left-sided pacing device with leads over right atrium and right ventricle. Mild cardiomegaly with aortic atherosclerosis. No acute opacity, pleural effusion or pneumothorax. Old right-sided rib fractures. IMPRESSION: No active disease.  Mild cardiomegaly Electronically Signed   By: Donavan Foil M.D.   On: 02/21/2019 22:59       Subjective: Continues to do well.  Discharge Exam: Vitals:   02/27/19 0544 02/27/19 1139  BP: 132/60 129/60  Pulse: 60 62  Resp:  18  Temp: 98.4 F (36.9 C) 98.5 F (36.9 C)  SpO2: 97% 98%   Vitals:   02/26/19 2012 02/27/19 0544 02/27/19 0856 02/27/19 1139  BP: (!) 131/54 132/60  129/60  Pulse: 66 60  62  Resp:    18  Temp:  98.4 F (36.9 C)  98.5 F (36.9 C)  TempSrc:  Oral  Oral  SpO2:  97%  98%  Weight:   67.6 kg   Height:        General: Pt is alert, awake, not in acute distress Cardiovascular: RRR, S1/S2 +, no rubs, no gallops Respiratory: CTA bilaterally, no wheezing, no rhonchi Abdominal: Soft, NT, ND, bowel sounds + Extremities: no edema, no cyanosis    The results of significant diagnostics from this hospitalization (including imaging, microbiology, ancillary and laboratory) are listed below for reference.     Microbiology: Recent  Results (from the past 240 hour(s))  SARS Coronavirus 2 (CEPHEID - Performed in [place] hospital lab), Hosp Order     Status: None   Collection Time: 02/22/19 12:14 AM  Result Value Ref Range Status   SARS Coronavirus 2 NEGATIVE NEGATIVE Final    Comment: (NOTE) If result is NEGATIVE SARS-CoV-2 target nucleic acids are NOT DETECTED. The SARS-CoV-2 RNA is generally detectable in upper and lower  respiratory specimens during the acute phase of infection. The lowest  concentration of SARS-CoV-2 viral copies this assay can detect is 250  copies / mL. A negative  result does not preclude SARS-CoV-2 infection  and should not be used as the sole basis for treatment or other  patient management decisions.  A negative result may occur with  improper specimen collection / handling, submission of specimen other  than nasopharyngeal swab, presence of viral mutation(s) within the  areas targeted by this assay, and inadequate number of viral copies  (<250 copies / mL). A negative result must be combined with clinical  observations, patient history, and epidemiological information. If result is POSITIVE SARS-CoV-2 target nucleic acids are DETECTED. The SARS-CoV-2 RNA is generally detectable in upper and lower  respiratory specimens dur ing the acute phase of infection.  Positive  results are indicative of active infection with SARS-CoV-2.  Clinical  correlation with patient history and other diagnostic information is  necessary to determine patient infection status.  Positive results do  not rule out bacterial infection or co-infection with other viruses. If result is PRESUMPTIVE POSTIVE SARS-CoV-2 nucleic acids MAY BE PRESENT.   A presumptive positive result was obtained on the submitted specimen  and confirmed on repeat testing.  While 2019 novel coronavirus  (SARS-CoV-2) nucleic acids may be present in the submitted sample  additional confirmatory testing may be necessary for epidemiological  and / or clinical management purposes  to differentiate between  SARS-CoV-2 and other Sarbecovirus currently known to infect humans.  If clinically indicated additional testing with an alternate test  methodology [phone]) is advised. The SARS-CoV-2 RNA is generally  detectable in upper and lower respiratory sp ecimens during the acute  phase of infection. The expected result is Negative. Fact Sheet for Patients:  StrictlyIdeas.no Fact Sheet for Healthcare Providers: BankingDealers.co.za This test is not yet  approved or cleared by the [place] FDA and has been authorized for detection and/or diagnosis of SARS-CoV-2 by FDA under an Emergency Use Authorization (EUA).  This EUA will remain in effect (meaning this test can be used) for the duration of the COVID-19 declaration under Section 564(b)(1) of the Act, 21 U.S.C. section 360bbb-3(b)(1), unless the authorization is terminated or revoked sooner. Performed at [facility], [place] [address], [place], [place] [address]      Labs: BNP (last 3 results) No results for input(s): BNP in the last 8760 hours. Basic Metabolic Panel: Recent Labs  Lab 02/22/19 0008 02/22/19 0436  NA 132* 132*  K 4.5 4.3  CL 100 101  CO2 22 23  GLUCOSE 123* 127*  BUN 10 11  CREATININE 1.30* 1.21*  CALCIUM 8.7* 8.6*   Liver Function Tests: Recent Labs  Lab 02/22/19 0008  AST 29  ALT 12  ALKPHOS 55  BILITOT 0.9  PROT 6.9  ALBUMIN 3.8   No results for input(s): LIPASE, AMYLASE in the last 168 hours. No results for input(s): AMMONIA in the last 168 hours. CBC: Recent Labs  Lab 02/22/19 0008 02/22/19 0436 02/22/19 0500 02/23/19 1034  WBC 12.5* 9.9  9.8 DUP SEE H7204 9.2  NEUTROABS 10.5* 7.7 PENDING  --   HGB 11.6* 10.3*  10.2* DUP SEE H7204 11.1*  HCT 34.0* 29.9*  29.4* DUP SEE H7204 32.8*  MCV 91.2 89.8  89.1 DUP SEE H7204 91.1  PLT PLATELET CLUMPS NOTED ON SMEAR, UNABLE TO ESTIMATE 131*  140* DUP SEE H7204 123*   Cardiac Enzymes: No results for input(s): CKTOTAL, CKMB, CKMBINDEX, TROPONINI in the last 168 hours. BNP: Invalid input(s): POCBNP CBG: No results for input(s): GLUCAP in the last 168 hours. D-Dimer No results for input(s): DDIMER in the last 72 hours. Hgb A1c No results for input(s): HGBA1C in the last 72 hours. Lipid Profile No results for input(s): CHOL, HDL, LDLCALC, TRIG, CHOLHDL, LDLDIRECT in the last 72 hours. Thyroid function studies No results for input(s): TSH, T4TOTAL, T3FREE, THYROIDAB in the last  72 hours.  Invalid input(s): FREET3 Anemia work up No results for input(s): VITAMINB12, FOLATE, FERRITIN, TIBC, IRON, RETICCTPCT in the last 72 hours. Urinalysis    Component Value Date/Time   COLORURINE YELLOW 02/23/2019 0229   APPEARANCEUR CLEAR 02/23/2019 0229   LABSPEC 1.012 02/23/2019 0229   PHURINE 6.0 02/23/2019 0229   GLUCOSEU NEGATIVE 02/23/2019 0229   HGBUR SMALL (A) 02/23/2019 0229   BILIRUBINUR NEGATIVE 02/23/2019 0229   KETONESUR NEGATIVE 02/23/2019 0229   PROTEINUR NEGATIVE 02/23/2019 0229   UROBILINOGEN 0.2 06/25/2013 1940   NITRITE NEGATIVE 02/23/2019 0229   LEUKOCYTESUR TRACE (A) 02/23/2019 0229   Sepsis Labs Invalid input(s): PROCALCITONIN,  WBC,  LACTICIDVEN Microbiology Recent Results (from the past 240 hour(s))  SARS Coronavirus 2 (CEPHEID - Performed in [place] hospital lab), Hosp Order     Status: None   Collection Time: 02/22/19 12:14 AM  Result Value Ref Range Status   SARS Coronavirus 2 NEGATIVE NEGATIVE Final    Comment: (NOTE) If result is NEGATIVE SARS-CoV-2 target nucleic acids are NOT DETECTED. The SARS-CoV-2 RNA is generally detectable in upper and lower  respiratory specimens during the acute phase of infection. The lowest  concentration of SARS-CoV-2 viral copies this assay can detect is 250  copies / mL. A negative result does not preclude SARS-CoV-2 infection  and should not be used as the sole basis for treatment or other  patient management decisions.  A negative result may occur with  improper specimen collection / handling, submission of specimen other  than nasopharyngeal swab, presence of viral mutation(s) within the  areas targeted by this assay, and inadequate number of viral copies  (<250 copies / mL). A negative result must be combined with clinical  observations, patient history, and epidemiological information. If result is POSITIVE SARS-CoV-2 target nucleic acids are DETECTED. The SARS-CoV-2 RNA is generally detectable  in upper and lower  respiratory specimens dur ing the acute phase of infection.  Positive  results are indicative of active infection with SARS-CoV-2.  Clinical  correlation with patient history and other diagnostic information is  necessary to determine patient infection status.  Positive results do  not rule out bacterial infection or co-infection with other viruses. If result is PRESUMPTIVE POSTIVE SARS-CoV-2 nucleic acids MAY BE PRESENT.   A presumptive positive result was obtained on the submitted specimen  and confirmed on repeat testing.  While 2019 novel coronavirus  (SARS-CoV-2) nucleic acids may be present in the submitted sample  additional confirmatory testing may be necessary for epidemiological  and / or clinical management purposes  to differentiate between  SARS-CoV-2 and other Sarbecovirus  currently known to infect humans.  If clinically indicated additional testing with an alternate test  methodology [phone]) is advised. The SARS-CoV-2 RNA is generally  detectable in upper and lower respiratory sp ecimens during the acute  phase of infection. The expected result is Negative. Fact Sheet for Patients:  StrictlyIdeas.no Fact Sheet for Healthcare Providers: BankingDealers.co.za This test is not yet approved or cleared by the [place] FDA and has been authorized for detection and/or diagnosis of SARS-CoV-2 by FDA under an Emergency Use Authorization (EUA).  This EUA will remain in effect (meaning this test can be used) for the duration of the COVID-19 declaration under Section 564(b)(1) of the Act, 21 U.S.C. section 360bbb-3(b)(1), unless the authorization is terminated or revoked sooner. Performed at [facility], [place] [address], [place], [place] [address]      Time coordinating discharge: 35 minutes  SIGNED:   Nicolette Bang, MD  [facility] 02/27/2019, 12:08 PM Pager   If 7PM-7AM,  please contact night-coverage [email or web address] Password [email or web address]

## 2019-02-27 NOTE — Progress Notes (Signed)
Physical Therapy Treatment Patient Details Name: Erica Carroll MRN: [id number] DOB: 09/29/1921 Today's Date: 02/27/2019    History of Present Illness 85 yo s/p fall at home with Right pubic rami fx. PMHx: AFib, CKD, HOH, mild dementia, HTN    PT Comments    Pt in chair on arrival with daughter present and reports increased ability with bed transfer. Daughter stating family has decided for pt to return home with daughter who has 5 steps to enter with 1 level house. For return home pt will need BSC, RW and W/C to be able to enter and exit home as well as mobilize within home. Discussed home management with daughter and pt including W/C on stairs with handout provided, HEP with handout, functional mobility and assist for return home. Pt also encouraged to have adult briefs for travel home due to inability to easily toilet during travel with pt. Daughter voiced understanding of all education.    HEP Access Code: [email or web address]  URL: [email or web address]  Date: 02/27/2019  Prepared by: Marquis Buggy   Exercises Seated March - 15 reps - 1 sets - 3x daily - 7x weekly Seated Long Arc Quad - 15 reps - 1 sets - 3x daily - 7x weekly Seated Heel Raise - 20 reps - 1 sets - 3x daily - 7x weekly Seated Heel Toe Raises - 20 reps - 1 sets - 3x daily - 7x weekly Supine Hip Abduction - 10 reps - 1 sets - 3x daily - 7x weekly   Follow Up Recommendations  Home health PT;Supervision/Assistance - 24 hour;SNF(family has declined sNF for home)     Equipment Recommendations  Wheelchair (measurements PT);3in1 (PT);Rolling walker with 5" wheels    Recommendations for Other Services       Precautions / Restrictions Precautions Precautions: Fall Restrictions Weight Bearing Restrictions: Yes RLE Weight Bearing: Weight bearing as tolerated LLE Weight Bearing: Weight bearing as tolerated    Mobility  Bed Mobility               General bed mobility comments: in chair on arrival    Transfers Overall transfer level: Needs assistance   Transfers: Sit to/from Stand Sit to Stand: Min assist         General transfer comment: cues for hand placement with assist to translate anteriorly and rise and lower   Ambulation/Gait Ambulation/Gait assistance: Min assist;+2 safety/equipment Gait Distance (Feet): 50 Feet Assistive device: Rolling walker (2 wheeled) Gait Pattern/deviations: Step-to pattern;Trunk flexed;Decreased stance time - right   Gait velocity interpretation: 1.31 - 2.62 ft/sec, indicative of limited community ambulator General Gait Details: pt with cues for sequence, posture and position in RW. pt with increased step length and speed with increased distance, chair follow for fatigue   Stairs             Wheelchair Mobility    Modified Rankin (Stroke Patients Only)       Balance Overall balance assessment: History of Falls;Needs assistance Sitting-balance support: Bilateral upper extremity supported;Feet supported Sitting balance-Leahy Scale: Fair     Standing balance support: Bilateral upper extremity supported;During functional activity Standing balance-Leahy Scale: Poor Standing balance comment: bil UE support for gait                            Cognition Arousal/Alertness: Awake/alert Behavior During Therapy: WFL for tasks assessed/performed Overall Cognitive Status: Impaired/Different from baseline Area of Impairment: Following commands;Safety/judgement;Problem solving  Orientation Level: Disoriented to;Time   Memory: Decreased short-term memory Following Commands: Follows one step commands with increased time Safety/Judgement: Decreased awareness of safety;Decreased awareness of deficits   Problem Solving: Slow processing;Difficulty sequencing;Requires verbal cues;Requires tactile cues;Decreased initiation General Comments: Daughter present and patient looking to daughter to answer most  questions      Exercises General Exercises - Lower Extremity Long Arc Quad: AROM;20 reps;Both Hip ABduction/ADduction: Right;15 reps;Seated;AAROM Hip Flexion/Marching: AROM;Seated;Both;20 reps    General Comments        Pertinent Vitals/Pain Pain Score: 3  Pain Location: right pelvis with movement, no pain at rest Pain Descriptors / Indicators: Aching;Sore Pain Intervention(s): Limited activity within patient's tolerance;Monitored during session;Repositioned;Premedicated before session    Home Living                      Prior Function            PT Goals (current goals can now be found in the care plan section) Progress towards PT goals: Progressing toward goals    Frequency           PT Plan Discharge plan needs to be updated    Co-evaluation              AM-PAC PT "6 Clicks" Mobility   Outcome Measure  Help needed turning from your back to your side while in a flat bed without using bedrails?: A Lot Help needed moving from lying on your back to sitting on the side of a flat bed without using bedrails?: A Lot Help needed moving to and from a bed to a chair (including a wheelchair)?: A Little Help needed standing up from a chair using your arms (e.g., wheelchair or bedside chair)?: A Little Help needed to walk in hospital room?: A Little Help needed climbing 3-5 steps with a railing? : A Lot 6 Click Score: 15    End of Session Equipment Utilized During Treatment: Gait belt Activity Tolerance: Patient tolerated treatment well Patient left: in chair;with call bell/phone within reach;with family/visitor present Nurse Communication: Mobility status PT Visit Diagnosis: Other abnormalities of gait and mobility (R26.89);Muscle weakness (generalized) (M62.81);Pain;History of falling (Z91.81)     Time: 1000-1035 PT Time Calculation (min) (ACUTE ONLY): 35 min  Charges:  $Gait Training: 8-22 mins $Self Care/Home Management: [place], PT Acute Rehabilitation Services Pager: [phone] Office: [phone]    Sandy Salaam Joziah Dollins 02/27/2019, 12:56 PM

## 2019-02-27 NOTE — TOC Progression Note (Addendum)
Transition of Care [facility]) - Progression Note    Patient Details  Name: KEYLI DUROSS MRN: [id number] Date of Birth: July 15, 1922  [facility]) CM/SW Samet Dallas, LCSW Phone Number: 02/27/2019, 9:35 AM  Clinical Narrative: Left daughter a voicemail to see if there are any updates on facility choice. No new bed offers since we last spoke.  12:46 pm: Plan is now to go home with daughter in [place] with home health. They are hoping she can go tomorrow so they can make arrangements. Provided daughter with CMS scores for home health agencies that serve [place]. She will review and call CSW with decision. They are requesting a bedside commode and wheelchair. Sent message to MD requesting home health orders for PT, OT, RN, aide, and CSW as well as DME orders for 3-in-1 and wheelchair. Per Zack with [employer], wheelchair order will include length of need but also needs comments: "Lightweight with back and seat cushion, elevated leg rest and anti-tippers."   3:19 pm: First preference home health agency is [facility]. Made referral and faxed information. Called Zack with [employer] and ordered 3-in-1 and wheelchair. Daughter thinks patient may have a rolling walker so she will have her brother check when he gets home.  Expected Discharge Plan: Skilled Nursing Facility Barriers to Discharge: Ship broker, Continued Medical Work up  Expected Discharge Plan and Services Expected Discharge Plan: [place] arrangements for the past 2 months: Single Family Home                                       Social Determinants of Health (SDOH) Interventions    Readmission Risk Interventions No flowsheet data found.

## 2019-02-27 NOTE — Telephone Encounter (Signed)
Called patient on 02/27/2019 , 1:55 PM in an attempt to reach the patient for a hospital follow up. Per patient's daughter, the patient will not be discharged today, but possibly tomorrow. The patient will be going home with her daughter and a call will be made to her when she leaves the hospital.

## 2019-02-28 LAB — PROTIME-INR
INR: 2.6 — ABNORMAL HIGH (ref 0.8–1.2)
Prothrombin Time: 27.2 seconds — ABNORMAL HIGH (ref 11.4–15.2)

## 2019-02-28 NOTE — TOC Transition Note (Signed)
Transition of Care [facility]) - CM/SW Discharge Note   Patient Details  Name: Erica Carroll MRN: [id number] Date of Birth: Dec 27, 1921  Transition of Care [facility]) CM/SW Contact:  Candie Chroman, LCSW Phone Number: 02/28/2019, 12:55 PM   Clinical Narrative: Patient discharging to daughter's home today. Home Health has been arranged through [facility]. Wheelchair and 3-in-1 are in the room. CSW signing off.  Final next level of care: [place] Barriers to Discharge: Barriers Resolved   Patient Goals and CMS Choice Patient states their goals for this hospitalization and ongoing recovery are:: Patient not fully oriented. CMS Medicare.gov Compare Post Acute Care list provided to:: Patient Represenative (must comment)(Daughter) Choice offered to / list presented to : Adult Children  Discharge Placement                Patient to be transferred to facility by: Family will drive. Name of family member notified: Della Goo Patient and family notified of of transfer: 02/28/19  Discharge Plan and Services                DME Arranged: 3-N-1, Wheelchair manual DME Agency: [employer] Date DME Agency Contacted: 02/27/19   Representative spoke with at DME Agency: [place]: RN, PT, OT, Nurse's Aide, Social Work [employer] Agency: Other - See comment([place]ell Ferguson) Date [place]: 02/27/19   Representative spoke with at [place]: [place] ([place]) Interventions     Readmission Risk Interventions No flowsheet data found.

## 2019-02-28 NOTE — Plan of Care (Signed)
  Problem: Education: Goal: Knowledge of General Education information will improve Description Including pain rating scale, medication(s)/side effects and non-pharmacologic comfort measures Outcome: Adequate for Discharge   Problem: Pain Managment: Goal: General experience of comfort will improve Outcome: Adequate for Discharge

## 2019-02-28 NOTE — Care Management Important Message (Signed)
Important Message  Patient Details  Name: Erica Carroll MRN: [id number] Date of Birth: 1922/08/16   Medicare Important Message Given:  Yes    Ellason Segar 02/28/2019, 1:57 PM

## 2019-02-28 NOTE — Progress Notes (Signed)
85 yo with hx afib on metop and coumadin, brady s/p pacemaker placement, CKD III, anemia, dementia who presented after presumed mechanical fall at home and was found to have pubic rami fractures.  She's improved now with improved pain control.  Only requiring tylenol for pain.  At this point, planning for d/c home with family (daughter in [place]) with home health.  See discharge summary for additional details regarding discharge.  Pt confused regarding events of fall, but denies any pain.  Pleasant.  Alert. Daughter at bedside, discussed d/c plan. Discussed caution with warfarin in future if pt has recurrent falls or seems to be unsteady (but this is only fall recently, so will continue for now).   NAD CTAB, unlabored breathing RRR S/nt/nd No LE edema  Plan for d/c today with home health as noted in d/c summary from 6/2 Outpatient f/u with ortho for pubic rami fractures

## 2019-02-28 NOTE — Progress Notes (Signed)
Patient has discharge order to home. Discharge instructions reviewed with daughter at the bedside. Family and patient without any questions or concerns. IV and telemetry removed.

## 2019-03-01 ENCOUNTER — Telehealth: Payer: Self-pay | Admitting: *Deleted

## 2019-03-01 ENCOUNTER — Ambulatory Visit (INDEPENDENT_AMBULATORY_CARE_PROVIDER_SITE_OTHER): Payer: Medicare HMO | Admitting: Pharmacist

## 2019-03-01 DIAGNOSIS — E559 Vitamin D deficiency, unspecified: Secondary | ICD-10-CM | POA: Diagnosis not present

## 2019-03-01 DIAGNOSIS — I480 Paroxysmal atrial fibrillation: Secondary | ICD-10-CM | POA: Diagnosis not present

## 2019-03-01 DIAGNOSIS — D631 Anemia in chronic kidney disease: Secondary | ICD-10-CM | POA: Diagnosis not present

## 2019-03-01 DIAGNOSIS — Z8673 Personal history of transient ischemic attack (TIA), and cerebral infarction without residual deficits: Secondary | ICD-10-CM

## 2019-03-01 DIAGNOSIS — M0689 Other specified rheumatoid arthritis, multiple sites: Secondary | ICD-10-CM | POA: Diagnosis not present

## 2019-03-01 DIAGNOSIS — Z79899 Other long term (current) drug therapy: Secondary | ICD-10-CM | POA: Diagnosis not present

## 2019-03-01 DIAGNOSIS — N183 Chronic kidney disease, stage 3 (moderate): Secondary | ICD-10-CM | POA: Diagnosis not present

## 2019-03-01 DIAGNOSIS — R32 Unspecified urinary incontinence: Secondary | ICD-10-CM | POA: Diagnosis not present

## 2019-03-01 DIAGNOSIS — R69 Illness, unspecified: Secondary | ICD-10-CM | POA: Diagnosis not present

## 2019-03-01 DIAGNOSIS — S32810D Multiple fractures of pelvis with stable disruption of pelvic ring, subsequent encounter for fracture with routine healing: Secondary | ICD-10-CM | POA: Diagnosis not present

## 2019-03-01 DIAGNOSIS — R001 Bradycardia, unspecified: Secondary | ICD-10-CM | POA: Diagnosis not present

## 2019-03-01 DIAGNOSIS — I129 Hypertensive chronic kidney disease with stage 1 through stage 4 chronic kidney disease, or unspecified chronic kidney disease: Secondary | ICD-10-CM | POA: Diagnosis not present

## 2019-03-01 LAB — POCT INR: INR: 3.2 — AB (ref 2.0–3.0)

## 2019-03-01 NOTE — Telephone Encounter (Signed)
Called patient on 03/01/2019 , 5:07 PM in an attempt to reach the patient for a hospital follow up. I spoke with the patient's daughter, who has taken the patient to her home in [place].  Admit date: 02/21/19 Discharge: 02/28/19   She does not have any questions or concerns about medications from the hospital admission. The patient's medications were reviewed over the phone, they were counseled to bring in all current medications to the hospital follow up visit.   I advised the patient to call if any questions or concerns arise about the hospital admission or medications    Home health was started in the hospital.  All questions were answered and a follow up appointment was made. Due to the patient's condition, a telephone visit was scheduled with Rayford Halsted, on 03/08/2019, instead of a face to face visit.  Prior to Admission medications   Medication Sig Start Date End Date Taking? Authorizing Provider  acetaminophen (TYLENOL) 325 MG tablet Take 325 mg by mouth every 6 (six) hours as needed for mild pain.     [provider]  b complex vitamins tablet Take 1 tablet by mouth daily.    [provider]  Cholecalciferol (VITAMIN D PO) Take 3,000 Units by mouth daily.     [provider]  diltiazem (CARDIZEM) 30 MG tablet Take 1 tablet (30 mg total) by mouth daily as needed. For Afib episodes 09/01/16   Deboraha Sprang, MD  GLUCOSAMINE-CHONDROITIN PO Take 1 tablet by mouth daily.    [provider]  MAGNESIUM OXIDE, ANTACID, PO Take 1 tablet by mouth daily.    [provider]  metoprolol tartrate (LOPRESSOR) 25 MG tablet Take 3 tablets (75 mg) by mouth twice daily. Please schedule an appt for further refills Patient taking differently: Take 75 mg by mouth 2 (two) times daily.  01/11/19   Deboraha Sprang, MD  OVER THE COUNTER MEDICATION Take 1 tablet by mouth daily. Preserve vision vitamin    [provider]  polyethylene glycol (MIRALAX /  GLYCOLAX) packet Take 17 g by mouth daily as needed (constipation).    [provider]  warfarin (COUMADIN) 5 MG tablet Take 1/2 to 1 tablet daily as directed by coumadin clinic. Patient taking differently: Take 2.5-5 mg by mouth See admin instructions. Take 1/2 tablet on Thursday and Sunday then take 1 tablet all other days. 02/06/19   Deboraha Sprang, MD

## 2019-03-02 DIAGNOSIS — R32 Unspecified urinary incontinence: Secondary | ICD-10-CM | POA: Diagnosis not present

## 2019-03-02 DIAGNOSIS — S32810D Multiple fractures of pelvis with stable disruption of pelvic ring, subsequent encounter for fracture with routine healing: Secondary | ICD-10-CM | POA: Diagnosis not present

## 2019-03-02 DIAGNOSIS — E559 Vitamin D deficiency, unspecified: Secondary | ICD-10-CM | POA: Diagnosis not present

## 2019-03-02 DIAGNOSIS — M0689 Other specified rheumatoid arthritis, multiple sites: Secondary | ICD-10-CM | POA: Diagnosis not present

## 2019-03-02 DIAGNOSIS — R69 Illness, unspecified: Secondary | ICD-10-CM | POA: Diagnosis not present

## 2019-03-02 DIAGNOSIS — R001 Bradycardia, unspecified: Secondary | ICD-10-CM | POA: Diagnosis not present

## 2019-03-02 DIAGNOSIS — I480 Paroxysmal atrial fibrillation: Secondary | ICD-10-CM | POA: Diagnosis not present

## 2019-03-02 DIAGNOSIS — I129 Hypertensive chronic kidney disease with stage 1 through stage 4 chronic kidney disease, or unspecified chronic kidney disease: Secondary | ICD-10-CM | POA: Diagnosis not present

## 2019-03-02 DIAGNOSIS — D631 Anemia in chronic kidney disease: Secondary | ICD-10-CM | POA: Diagnosis not present

## 2019-03-02 DIAGNOSIS — N183 Chronic kidney disease, stage 3 (moderate): Secondary | ICD-10-CM | POA: Diagnosis not present

## 2019-03-05 DIAGNOSIS — R69 Illness, unspecified: Secondary | ICD-10-CM | POA: Diagnosis not present

## 2019-03-05 DIAGNOSIS — E559 Vitamin D deficiency, unspecified: Secondary | ICD-10-CM | POA: Diagnosis not present

## 2019-03-05 DIAGNOSIS — R32 Unspecified urinary incontinence: Secondary | ICD-10-CM | POA: Diagnosis not present

## 2019-03-05 DIAGNOSIS — N183 Chronic kidney disease, stage 3 (moderate): Secondary | ICD-10-CM | POA: Diagnosis not present

## 2019-03-05 DIAGNOSIS — D631 Anemia in chronic kidney disease: Secondary | ICD-10-CM | POA: Diagnosis not present

## 2019-03-05 DIAGNOSIS — R001 Bradycardia, unspecified: Secondary | ICD-10-CM | POA: Diagnosis not present

## 2019-03-05 DIAGNOSIS — I480 Paroxysmal atrial fibrillation: Secondary | ICD-10-CM | POA: Diagnosis not present

## 2019-03-05 DIAGNOSIS — S32810D Multiple fractures of pelvis with stable disruption of pelvic ring, subsequent encounter for fracture with routine healing: Secondary | ICD-10-CM | POA: Diagnosis not present

## 2019-03-05 DIAGNOSIS — M0689 Other specified rheumatoid arthritis, multiple sites: Secondary | ICD-10-CM | POA: Diagnosis not present

## 2019-03-05 DIAGNOSIS — I129 Hypertensive chronic kidney disease with stage 1 through stage 4 chronic kidney disease, or unspecified chronic kidney disease: Secondary | ICD-10-CM | POA: Diagnosis not present

## 2019-03-06 ENCOUNTER — Telehealth: Payer: Self-pay | Admitting: Internal Medicine

## 2019-03-06 DIAGNOSIS — D631 Anemia in chronic kidney disease: Secondary | ICD-10-CM | POA: Diagnosis not present

## 2019-03-06 DIAGNOSIS — R001 Bradycardia, unspecified: Secondary | ICD-10-CM | POA: Diagnosis not present

## 2019-03-06 DIAGNOSIS — N183 Chronic kidney disease, stage 3 (moderate): Secondary | ICD-10-CM | POA: Diagnosis not present

## 2019-03-06 DIAGNOSIS — M0689 Other specified rheumatoid arthritis, multiple sites: Secondary | ICD-10-CM | POA: Diagnosis not present

## 2019-03-06 DIAGNOSIS — E559 Vitamin D deficiency, unspecified: Secondary | ICD-10-CM | POA: Diagnosis not present

## 2019-03-06 DIAGNOSIS — S32810D Multiple fractures of pelvis with stable disruption of pelvic ring, subsequent encounter for fracture with routine healing: Secondary | ICD-10-CM | POA: Diagnosis not present

## 2019-03-06 DIAGNOSIS — I129 Hypertensive chronic kidney disease with stage 1 through stage 4 chronic kidney disease, or unspecified chronic kidney disease: Secondary | ICD-10-CM | POA: Diagnosis not present

## 2019-03-06 DIAGNOSIS — R32 Unspecified urinary incontinence: Secondary | ICD-10-CM | POA: Diagnosis not present

## 2019-03-06 DIAGNOSIS — I480 Paroxysmal atrial fibrillation: Secondary | ICD-10-CM | POA: Diagnosis not present

## 2019-03-06 DIAGNOSIS — R69 Illness, unspecified: Secondary | ICD-10-CM | POA: Diagnosis not present

## 2019-03-06 NOTE — Telephone Encounter (Signed)
Ayden in [place], called to advise patient has began [place], and 1 Social Work order was completed. Notes to be faxed to our office. HH was ordered during patients recent hospital visit.   Called patient's daughter, mother is at home with her in [place]. HH was ordered in hospital and has begun in home. Social worker visit was completed 03-05-19. Patient is motivated in her recovery and desires to recover with daughter then return to [place]- to be near her doctors Melford Aase and [place].

## 2019-03-07 NOTE — Progress Notes (Signed)
Hospital follow up  Assessment and Plan: Hospital visit follow up for:   Kaylaann was seen today for hospitalization follow-up.  Diagnoses and all orders for this visit:  Closed fracture of pubis with routine healing, unspecified portion of pubis, unspecified laterality, subsequent encounter Pain well managed by tylenol Schedule follow up with ortho early July - reviewed contact information with daughter Continue PT/OT No DEXA report available; will discuss this once current fracture resolved and next CPE/AWV  Closed bilateral fracture of pubic rami with routine healing, subsequent encounter  CKD stage G3b/A1, GFR 30-44 and albumin creatinine ratio <30 mg/g (HCC) CMP/GFR - will coordinate via home health  Discussed fluid intake with daughter, gradually increase   Mild dementia ([place]) Stable, monitor; family assists as needed Requesting home health to assist with ADLs which had previously declined Will coordinate order through current home health agency [facility] health)   Anemia Chronic, stable; recheck CBC; B12 normal, will check iron due to never being checked in past 5 years   All medications were reviewed with patient and family and fully reconciled. All questions answered fully, and patient and family members were encouraged to call the office with any further questions or concerns. Discussed goal to avoid readmission related to this diagnosis.   There are no discontinued medications.  Over 40 minutes of exam, counseling, chart review, and complex, high/moderate level critical decision making was performed this visit.   Future Appointments  Date Time Provider [place]  04/04/2019  2:00 PM Unk Pinto, MD [facility] None  04/10/2019  7:20 AM [facility] DEVICE REMOTES [facility]  10/04/2019 11:15 AM Liane Comber, NP [facility] None    HPI 85 y.o.female presents for follow up for transition from recent hospitalization or SNIF stay. Admit date  to the hospital was 02/21/19, patient was discharged from the hospital on 02/28/19 and our clinical staff contacted the office the day after discharge to set up a follow up appointment. The discharge summary, medications, and diagnostic test results were reviewed before meeting with the patient. The patient was admitted for:   Discharge Diagnoses:  Principal Problem:   Pelvic fracture (HCC) Active Problems:   Paroxysmal atrial fibrillation (HCC)   CKD stage G3b/A1, GFR 30-44 and albumin creatinine ratio <30 mg/g (HCC)   Mild dementia ([place])   Bilateral fracture of pubic rami ([place])  Recommendations for Outpatient Follow-up:  1. Follow up with PCP in 1-2 weeks 2. Please obtain BMP/CBC in one week   Brief/Interim Summary: Per admission: LAHOMA CONSTANTIN a 85 y.o.femalewithhistory of atrial fibrillation on metoprolol and Coumadin, sinus bradycardia status post pacemaker placement, chronic kidney disease stage III, chronic anemia mild dementia had a fall at home while walking. As per the daughter patient likely tripped on something in the hall and fell. Denies losing consciousness denies any chest pain nausea vomiting or diarrhea. Reported having pain in the right hip pelvic area.  ED Course:X-rays in the ER shows a right possible acute fractures of the right superior and inferior pubic rami. CT head and C-spine were unremarkable. EKG shows normal sinus rhythm with T wave changes which are comparableto the old EKG. Labs show anemia and mild hyponatremia and creatinine elevated at baseline. Patient has significant pain for which patient was given fentanyl and admitted for further observation.  Hospital course: 1. Acute fracture involving the right superior and inferior pubic rami status post mechanical fall-continue analgesicsphysical therapy consultshowed patient was unable tosafelyambulate, needing2 person assist to stand and pivot. Patient was unsafe to  discharge home high fall  risk with worsening injury at rest.Seen by orthopedics nonoperative at that time.Patient was declined for rehab, family interested in pursuing skilled nursing facility, and social work was pursuing-unfortunately unable to locate a facility patient's family is comfortable with. Family opted to discharge home with home health.   2.  A. fib on metoprolol and Coumadin.Continue nodal blocking agents, Coumadin. Her coumadin is managed by home health checks via coumadin clinic. Last value was this AM, 5.7 - was 5 mg 5 days a week and 2.5 mg twice weekly on Tuesday and Sat. She is holding coumadin for 3 days. She has recheck scheduled 6/17. Denies falls this past week, denies excess bruising (excepting down extremities consistent with discharge following pelvic fracture), no bleeding from gums, blood in urine, blood in stool. No falls since discharge.  Lab Results  Component Value Date   INR 5.7 (A) 03/08/2019   INR 3.2 (A) 03/01/2019   INR 2.6 (H) 02/28/2019   3. Chronic kidney disease stage III creatinine appears to be at baseline.Daughter is encouraging water intake, but estimates 2 x 12 fluid ounces daily.  Lab Results  Component Value Date   GFRNONAA 38 (L) 02/22/2019   4. Chronic anemia likely from renal disease appears to be at baseline. Follow CBC. Lab Results  Component Value Date   WBC 9.2 02/23/2019   HGB 11.1 (L) 02/23/2019   HCT 32.8 (L) 02/23/2019   MCV 91.1 02/23/2019   PLT 123 (L) 02/23/2019   No results found for: IRON, TIBC, FERRITIN   Lab Results  Component Value Date   VITAMINB12 496 10/02/2018  She is on B complex supplement  5. Mild hyponatremia follow metabolic panel. 6. Status post pacemaker placement for sinus bradycardia. Interrogation was done and reported to be normal no evidence of arrhythmia   Per daughter patient is typically reasonably stable on her feet, does walk with cane mainly for security. Believes she was reaching over a table and  tripped. This is first fall since last 6-7 years ago (slipped in mud).   Since discharge she has been twice weekly in home PT, also working with OT to build upper body strength, currently using a walker.   Patient denies significant pain with rest, does have pain with ambulation, reports improved from 10/10 down to 4-5/10 since discharge. She will take 1-2 tabs of 500 mg tylenol in the AM, will take 1 hour prior to PT, and 1 tab prior to bedtime.   Daughter requests coordination of assistance with bedbaths via home health.    Home health is involved.   Images while in the hospital: Dg Lumbar Spine Complete  Result Date: 02/21/2019 CLINICAL DATA:  Status post fall, back pain EXAM: LUMBAR SPINE - COMPLETE 4+ VIEW COMPARISON:  None. FINDINGS: There are 5 nonrib bearing lumbar-type vertebral bodies. There is generalized osteopenia. The vertebral body heights are maintained. There is grade 1 anterolisthesis of L4 on L5 and L5 on S1. There is 3 mm retrolisthesis of L3 on L4. There is no spondylolysis. There is no acute fracture. There is degenerative disc disease with disc height loss at L3-4, L4-5 and L5-S1. There is degenerative disc disease with disc height loss at L1-2. The SI joints are unremarkable. IMPRESSION: No acute osseous injury of the lumbar spine. Electronically Signed   By: Kathreen Devoid   On: 02/21/2019 23:36   Ct Head Wo Contrast  Result Date: 02/21/2019 CLINICAL DATA:  Recent fall while on blood thinners EXAM: CT  HEAD WITHOUT CONTRAST CT CERVICAL SPINE WITHOUT CONTRAST TECHNIQUE: Multidetector CT imaging of the head and cervical spine was performed following the standard protocol without intravenous contrast. Multiplanar CT image reconstructions of the cervical spine were also generated. COMPARISON:  01/28/2015 FINDINGS: CT HEAD FINDINGS Brain: No evidence of acute infarction, hemorrhage, hydrocephalus, extra-axial collection or mass lesion/mass effect. Mild atrophic changes are noted.  Vascular: No hyperdense vessel or unexpected calcification. Skull: Normal. Negative for fracture or focal lesion. Sinuses/Orbits: No acute finding. Other: None. CT CERVICAL SPINE FINDINGS Alignment: Within normal limits. Skull base and vertebrae: 7 cervical segments are well visualized. Vertebral body height is well maintained. Facet hypertrophic changes are noted at multiple levels. No acute fracture or acute facet abnormality is noted. Soft tissues and spinal canal: Surrounding soft tissue structures are within normal limits. Upper chest: Visualized lung apices are unremarkable. Other: None IMPRESSION: CT of the head: Atrophic changes without acute abnormality. CT of cervical spine: Multilevel degenerative change without acute abnormality. Electronically Signed   By: Inez Catalina M.D.   On: 02/21/2019 23:11   Ct Cervical Spine Wo Contrast  Result Date: 02/21/2019 CLINICAL DATA:  Recent fall while on blood thinners EXAM: CT HEAD WITHOUT CONTRAST CT CERVICAL SPINE WITHOUT CONTRAST TECHNIQUE: Multidetector CT imaging of the head and cervical spine was performed following the standard protocol without intravenous contrast. Multiplanar CT image reconstructions of the cervical spine were also generated. COMPARISON:  01/28/2015 FINDINGS: CT HEAD FINDINGS Brain: No evidence of acute infarction, hemorrhage, hydrocephalus, extra-axial collection or mass lesion/mass effect. Mild atrophic changes are noted. Vascular: No hyperdense vessel or unexpected calcification. Skull: Normal. Negative for fracture or focal lesion. Sinuses/Orbits: No acute finding. Other: None. CT CERVICAL SPINE FINDINGS Alignment: Within normal limits. Skull base and vertebrae: 7 cervical segments are well visualized. Vertebral body height is well maintained. Facet hypertrophic changes are noted at multiple levels. No acute fracture or acute facet abnormality is noted. Soft tissues and spinal canal: Surrounding soft tissue structures are within  normal limits. Upper chest: Visualized lung apices are unremarkable. Other: None IMPRESSION: CT of the head: Atrophic changes without acute abnormality. CT of cervical spine: Multilevel degenerative change without acute abnormality. Electronically Signed   By: Inez Catalina M.D.   On: 02/21/2019 23:11   Dg Pelvis Portable  Result Date: 02/21/2019 CLINICAL DATA:  Fall, question hip fracture EXAM: PORTABLE PELVIS 1-2 VIEWS COMPARISON:  None. FINDINGS: Coarse calcifications in the pelvis. Pubic symphysis is intact. Both femoral heads project in joint. There are mild degenerative changes. Possible fracture lucencies at the right superior and inferior pubic rami. IMPRESSION: Possible acute fractures involving the right superior and inferior pubic rami. Electronically Signed   By: Donavan Foil M.D.   On: 02/21/2019 23:01   Dg Pelvis Comp Min 3v  Result Date: 02/22/2019 CLINICAL DATA:  Pelvic fracture EXAM: JUDET PELVIS - 3+ VIEW COMPARISON:  the previous day's study FINDINGS: Minimally displaced fractures of the right superior ischial ramus and inferior ischiopubic ramus. No other pelvic ring fracture is identified. There is mild narrowing of articular cartilage in bilateral hips. No convincing femoral or acetabular fracture. No dislocation. Bilateral pelvic phleboliths. Mild spondylitic changes in the visualized lower lumbar spine. IMPRESSION: Minimally displaced right superior and inferior ischial ramus fractures. Electronically Signed   By: Lucrezia Europe M.D.   On: 02/22/2019 09:48   Dg Chest Port 1 View  Result Date: 02/21/2019 CLINICAL DATA:  Fall EXAM: PORTABLE CHEST 1 VIEW COMPARISON:  04/25/2015 FINDINGS: Left-sided pacing device  with leads over right atrium and right ventricle. Mild cardiomegaly with aortic atherosclerosis. No acute opacity, pleural effusion or pneumothorax. Old right-sided rib fractures. IMPRESSION: No active disease.  Mild cardiomegaly Electronically Signed   By: Donavan Foil M.D.    On: 02/21/2019 22:59      Current Outpatient Medications (Cardiovascular):  .  metoprolol tartrate (LOPRESSOR) 25 MG tablet, Take 3 tablets (75 mg) by mouth twice daily. Please schedule an appt for further refills (Patient taking differently: Take 75 mg by mouth 2 (two) times daily. ) .  diltiazem (CARDIZEM) 30 MG tablet, Take 1 tablet (30 mg total) by mouth daily as needed. For Afib episodes (Patient not taking: Reported on 03/08/2019)   Current Outpatient Medications (Analgesics):  .  acetaminophen (TYLENOL) 325 MG tablet, Take 325 mg by mouth every 6 (six) hours as needed for mild pain.   Current Outpatient Medications (Hematological):  .  warfarin (COUMADIN) 5 MG tablet, Take 1/2 to 1 tablet daily as directed by coumadin clinic. (Patient taking differently: Take 2.5-5 mg by mouth See admin instructions. Take 1/2 tablet on Thursday and Sunday then take 1 tablet all other days.)  Current Outpatient Medications (Other):  Marland Kitchen  Cholecalciferol (VITAMIN D PO), Take 3,000 Units by mouth daily.  Marland Kitchen  MAGNESIUM OXIDE, ANTACID, PO, Take 1 tablet by mouth daily. .  polyethylene glycol (MIRALAX / GLYCOLAX) packet, Take 17 g by mouth daily as needed (constipation). Marland Kitchen  b complex vitamins tablet, Take 1 tablet by mouth daily. Marland Kitchen  GLUCOSAMINE-CHONDROITIN PO, Take 1 tablet by mouth daily. Marland Kitchen  OVER THE COUNTER MEDICATION, Take 1 tablet by mouth daily. Preserve vision vitamin  Past Medical History:  Diagnosis Date  . A-fib ([place])    a. Dx 06/2013;  b. 06/2013 Echo:  EF 55-60%, no reg wma, mild AI, mod dil LA.  Marland Kitchen Allergy   . GERD (gastroesophageal reflux disease)   . Hyperlipidemia   . RA (rheumatoid arthritis) ([place])    " in my hands "  . Sinus bradycardia 07/11/2013  . TIA (transient ischemic attack)    a. with fall 05/2013 -> neg head CT and MRI/MRA;  b. Plavix started.  . Urinary incontinence   . Vitamin D deficiency      Allergies  Allergen Reactions  . Other Other (See Comments)    News Print:  (freshly printed newspapers) cause her eyes to water . Allergy type reaction. Medal: itching & rash    ROS: all negative except above.   Physical Exam: There were no vitals filed for this visit. BP 104/60   Temp (!) 97.3 F (36.3 C)   General : Well sounding patient in no apparent distress HEENT: no hoarseness, no cough for duration of visit Lungs: speaks in complete sentences, no audible wheezing, no apparent distress Neurological: alert, oriented x 3 Psychiatric: pleasant, judgement appropriate      Izora Ribas, NP 12:45 PM [facility]

## 2019-03-08 ENCOUNTER — Encounter: Payer: Self-pay | Admitting: Adult Health

## 2019-03-08 ENCOUNTER — Ambulatory Visit (INDEPENDENT_AMBULATORY_CARE_PROVIDER_SITE_OTHER): Payer: Medicare HMO | Admitting: Pharmacist

## 2019-03-08 ENCOUNTER — Other Ambulatory Visit: Payer: Self-pay | Admitting: Internal Medicine

## 2019-03-08 ENCOUNTER — Other Ambulatory Visit: Payer: Self-pay

## 2019-03-08 ENCOUNTER — Ambulatory Visit: Payer: Medicare HMO | Admitting: Adult Health

## 2019-03-08 VITALS — BP 104/60 | Temp 97.3°F

## 2019-03-08 DIAGNOSIS — S32591D Other specified fracture of right pubis, subsequent encounter for fracture with routine healing: Secondary | ICD-10-CM | POA: Diagnosis not present

## 2019-03-08 DIAGNOSIS — R32 Unspecified urinary incontinence: Secondary | ICD-10-CM | POA: Diagnosis not present

## 2019-03-08 DIAGNOSIS — S32810D Multiple fractures of pelvis with stable disruption of pelvic ring, subsequent encounter for fracture with routine healing: Secondary | ICD-10-CM | POA: Diagnosis not present

## 2019-03-08 DIAGNOSIS — D649 Anemia, unspecified: Secondary | ICD-10-CM

## 2019-03-08 DIAGNOSIS — Z8673 Personal history of transient ischemic attack (TIA), and cerebral infarction without residual deficits: Secondary | ICD-10-CM

## 2019-03-08 DIAGNOSIS — R69 Illness, unspecified: Secondary | ICD-10-CM | POA: Diagnosis not present

## 2019-03-08 DIAGNOSIS — I480 Paroxysmal atrial fibrillation: Secondary | ICD-10-CM | POA: Diagnosis not present

## 2019-03-08 DIAGNOSIS — Z79899 Other long term (current) drug therapy: Secondary | ICD-10-CM

## 2019-03-08 DIAGNOSIS — I129 Hypertensive chronic kidney disease with stage 1 through stage 4 chronic kidney disease, or unspecified chronic kidney disease: Secondary | ICD-10-CM | POA: Diagnosis not present

## 2019-03-08 DIAGNOSIS — N1832 Chronic kidney disease, stage 3b: Secondary | ICD-10-CM

## 2019-03-08 DIAGNOSIS — D631 Anemia in chronic kidney disease: Secondary | ICD-10-CM | POA: Diagnosis not present

## 2019-03-08 DIAGNOSIS — M0689 Other specified rheumatoid arthritis, multiple sites: Secondary | ICD-10-CM | POA: Diagnosis not present

## 2019-03-08 DIAGNOSIS — N183 Chronic kidney disease, stage 3 (moderate): Secondary | ICD-10-CM

## 2019-03-08 DIAGNOSIS — F0390 Unspecified dementia without behavioral disturbance: Secondary | ICD-10-CM

## 2019-03-08 DIAGNOSIS — S32509D Unspecified fracture of unspecified pubis, subsequent encounter for fracture with routine healing: Secondary | ICD-10-CM

## 2019-03-08 DIAGNOSIS — E559 Vitamin D deficiency, unspecified: Secondary | ICD-10-CM | POA: Diagnosis not present

## 2019-03-08 DIAGNOSIS — R001 Bradycardia, unspecified: Secondary | ICD-10-CM | POA: Diagnosis not present

## 2019-03-08 DIAGNOSIS — S32592D Other specified fracture of left pubis, subsequent encounter for fracture with routine healing: Secondary | ICD-10-CM | POA: Diagnosis not present

## 2019-03-08 DIAGNOSIS — F03A0 Unspecified dementia, mild, without behavioral disturbance, psychotic disturbance, mood disturbance, and anxiety: Secondary | ICD-10-CM

## 2019-03-08 LAB — POCT INR: INR: 5.7 — AB (ref 2.0–3.0)

## 2019-03-09 DIAGNOSIS — D631 Anemia in chronic kidney disease: Secondary | ICD-10-CM | POA: Diagnosis not present

## 2019-03-09 DIAGNOSIS — E559 Vitamin D deficiency, unspecified: Secondary | ICD-10-CM | POA: Diagnosis not present

## 2019-03-09 DIAGNOSIS — S32810D Multiple fractures of pelvis with stable disruption of pelvic ring, subsequent encounter for fracture with routine healing: Secondary | ICD-10-CM | POA: Diagnosis not present

## 2019-03-09 DIAGNOSIS — M0689 Other specified rheumatoid arthritis, multiple sites: Secondary | ICD-10-CM | POA: Diagnosis not present

## 2019-03-09 DIAGNOSIS — R32 Unspecified urinary incontinence: Secondary | ICD-10-CM | POA: Diagnosis not present

## 2019-03-09 DIAGNOSIS — R69 Illness, unspecified: Secondary | ICD-10-CM | POA: Diagnosis not present

## 2019-03-09 DIAGNOSIS — I480 Paroxysmal atrial fibrillation: Secondary | ICD-10-CM | POA: Diagnosis not present

## 2019-03-09 DIAGNOSIS — R001 Bradycardia, unspecified: Secondary | ICD-10-CM | POA: Diagnosis not present

## 2019-03-09 DIAGNOSIS — I129 Hypertensive chronic kidney disease with stage 1 through stage 4 chronic kidney disease, or unspecified chronic kidney disease: Secondary | ICD-10-CM | POA: Diagnosis not present

## 2019-03-09 DIAGNOSIS — N183 Chronic kidney disease, stage 3 (moderate): Secondary | ICD-10-CM | POA: Diagnosis not present

## 2019-03-13 DIAGNOSIS — M0689 Other specified rheumatoid arthritis, multiple sites: Secondary | ICD-10-CM | POA: Diagnosis not present

## 2019-03-13 DIAGNOSIS — D631 Anemia in chronic kidney disease: Secondary | ICD-10-CM | POA: Diagnosis not present

## 2019-03-13 DIAGNOSIS — S32810D Multiple fractures of pelvis with stable disruption of pelvic ring, subsequent encounter for fracture with routine healing: Secondary | ICD-10-CM | POA: Diagnosis not present

## 2019-03-13 DIAGNOSIS — R69 Illness, unspecified: Secondary | ICD-10-CM | POA: Diagnosis not present

## 2019-03-13 DIAGNOSIS — I129 Hypertensive chronic kidney disease with stage 1 through stage 4 chronic kidney disease, or unspecified chronic kidney disease: Secondary | ICD-10-CM | POA: Diagnosis not present

## 2019-03-13 DIAGNOSIS — E559 Vitamin D deficiency, unspecified: Secondary | ICD-10-CM | POA: Diagnosis not present

## 2019-03-13 DIAGNOSIS — N183 Chronic kidney disease, stage 3 (moderate): Secondary | ICD-10-CM | POA: Diagnosis not present

## 2019-03-13 DIAGNOSIS — R001 Bradycardia, unspecified: Secondary | ICD-10-CM | POA: Diagnosis not present

## 2019-03-13 DIAGNOSIS — I480 Paroxysmal atrial fibrillation: Secondary | ICD-10-CM | POA: Diagnosis not present

## 2019-03-13 DIAGNOSIS — R32 Unspecified urinary incontinence: Secondary | ICD-10-CM | POA: Diagnosis not present

## 2019-03-14 DIAGNOSIS — D631 Anemia in chronic kidney disease: Secondary | ICD-10-CM | POA: Diagnosis not present

## 2019-03-14 DIAGNOSIS — M0689 Other specified rheumatoid arthritis, multiple sites: Secondary | ICD-10-CM | POA: Diagnosis not present

## 2019-03-14 DIAGNOSIS — R32 Unspecified urinary incontinence: Secondary | ICD-10-CM | POA: Diagnosis not present

## 2019-03-14 DIAGNOSIS — R001 Bradycardia, unspecified: Secondary | ICD-10-CM | POA: Diagnosis not present

## 2019-03-14 DIAGNOSIS — S32810D Multiple fractures of pelvis with stable disruption of pelvic ring, subsequent encounter for fracture with routine healing: Secondary | ICD-10-CM | POA: Diagnosis not present

## 2019-03-14 DIAGNOSIS — N183 Chronic kidney disease, stage 3 (moderate): Secondary | ICD-10-CM | POA: Diagnosis not present

## 2019-03-14 DIAGNOSIS — I480 Paroxysmal atrial fibrillation: Secondary | ICD-10-CM | POA: Diagnosis not present

## 2019-03-14 DIAGNOSIS — E559 Vitamin D deficiency, unspecified: Secondary | ICD-10-CM | POA: Diagnosis not present

## 2019-03-14 DIAGNOSIS — I129 Hypertensive chronic kidney disease with stage 1 through stage 4 chronic kidney disease, or unspecified chronic kidney disease: Secondary | ICD-10-CM | POA: Diagnosis not present

## 2019-03-14 DIAGNOSIS — R69 Illness, unspecified: Secondary | ICD-10-CM | POA: Diagnosis not present

## 2019-03-15 ENCOUNTER — Ambulatory Visit (INDEPENDENT_AMBULATORY_CARE_PROVIDER_SITE_OTHER): Payer: Medicare HMO | Admitting: Internal Medicine

## 2019-03-15 DIAGNOSIS — E559 Vitamin D deficiency, unspecified: Secondary | ICD-10-CM | POA: Diagnosis not present

## 2019-03-15 DIAGNOSIS — Z5181 Encounter for therapeutic drug level monitoring: Secondary | ICD-10-CM

## 2019-03-15 DIAGNOSIS — I480 Paroxysmal atrial fibrillation: Secondary | ICD-10-CM

## 2019-03-15 DIAGNOSIS — R32 Unspecified urinary incontinence: Secondary | ICD-10-CM | POA: Diagnosis not present

## 2019-03-15 DIAGNOSIS — R001 Bradycardia, unspecified: Secondary | ICD-10-CM | POA: Diagnosis not present

## 2019-03-15 DIAGNOSIS — Z8673 Personal history of transient ischemic attack (TIA), and cerebral infarction without residual deficits: Secondary | ICD-10-CM

## 2019-03-15 DIAGNOSIS — R69 Illness, unspecified: Secondary | ICD-10-CM | POA: Diagnosis not present

## 2019-03-15 DIAGNOSIS — S32810D Multiple fractures of pelvis with stable disruption of pelvic ring, subsequent encounter for fracture with routine healing: Secondary | ICD-10-CM | POA: Diagnosis not present

## 2019-03-15 DIAGNOSIS — M0689 Other specified rheumatoid arthritis, multiple sites: Secondary | ICD-10-CM | POA: Diagnosis not present

## 2019-03-15 DIAGNOSIS — N183 Chronic kidney disease, stage 3 (moderate): Secondary | ICD-10-CM | POA: Diagnosis not present

## 2019-03-15 DIAGNOSIS — I129 Hypertensive chronic kidney disease with stage 1 through stage 4 chronic kidney disease, or unspecified chronic kidney disease: Secondary | ICD-10-CM | POA: Diagnosis not present

## 2019-03-15 DIAGNOSIS — Z79899 Other long term (current) drug therapy: Secondary | ICD-10-CM

## 2019-03-15 DIAGNOSIS — D631 Anemia in chronic kidney disease: Secondary | ICD-10-CM | POA: Diagnosis not present

## 2019-03-15 LAB — POCT INR: INR: 1.3 — AB (ref 2.0–3.0)

## 2019-03-15 NOTE — Patient Instructions (Signed)
Description   Spoke with Hilda Blades [facility] RN with [facility], advised to have pt take 5mg  today and 7.5mg  tomorrow then resume with 5mg  daily, 2.5mg  on Tuesday, Thursday, and Saturday. Recheck INR on 03/21/2019.  Call if placed on any new medications [phone]. Keep intake of greens consistent.

## 2019-03-16 DIAGNOSIS — I480 Paroxysmal atrial fibrillation: Secondary | ICD-10-CM | POA: Diagnosis not present

## 2019-03-16 DIAGNOSIS — E559 Vitamin D deficiency, unspecified: Secondary | ICD-10-CM | POA: Diagnosis not present

## 2019-03-16 DIAGNOSIS — M0689 Other specified rheumatoid arthritis, multiple sites: Secondary | ICD-10-CM | POA: Diagnosis not present

## 2019-03-16 DIAGNOSIS — R69 Illness, unspecified: Secondary | ICD-10-CM | POA: Diagnosis not present

## 2019-03-16 DIAGNOSIS — D631 Anemia in chronic kidney disease: Secondary | ICD-10-CM | POA: Diagnosis not present

## 2019-03-16 DIAGNOSIS — I129 Hypertensive chronic kidney disease with stage 1 through stage 4 chronic kidney disease, or unspecified chronic kidney disease: Secondary | ICD-10-CM | POA: Diagnosis not present

## 2019-03-16 DIAGNOSIS — N183 Chronic kidney disease, stage 3 (moderate): Secondary | ICD-10-CM | POA: Diagnosis not present

## 2019-03-16 DIAGNOSIS — S32810D Multiple fractures of pelvis with stable disruption of pelvic ring, subsequent encounter for fracture with routine healing: Secondary | ICD-10-CM | POA: Diagnosis not present

## 2019-03-16 DIAGNOSIS — R32 Unspecified urinary incontinence: Secondary | ICD-10-CM | POA: Diagnosis not present

## 2019-03-16 DIAGNOSIS — D649 Anemia, unspecified: Secondary | ICD-10-CM | POA: Diagnosis not present

## 2019-03-16 DIAGNOSIS — R001 Bradycardia, unspecified: Secondary | ICD-10-CM | POA: Diagnosis not present

## 2019-03-20 DIAGNOSIS — R001 Bradycardia, unspecified: Secondary | ICD-10-CM | POA: Diagnosis not present

## 2019-03-20 DIAGNOSIS — I480 Paroxysmal atrial fibrillation: Secondary | ICD-10-CM | POA: Diagnosis not present

## 2019-03-20 DIAGNOSIS — D631 Anemia in chronic kidney disease: Secondary | ICD-10-CM | POA: Diagnosis not present

## 2019-03-20 DIAGNOSIS — I129 Hypertensive chronic kidney disease with stage 1 through stage 4 chronic kidney disease, or unspecified chronic kidney disease: Secondary | ICD-10-CM | POA: Diagnosis not present

## 2019-03-20 DIAGNOSIS — R32 Unspecified urinary incontinence: Secondary | ICD-10-CM | POA: Diagnosis not present

## 2019-03-20 DIAGNOSIS — R69 Illness, unspecified: Secondary | ICD-10-CM | POA: Diagnosis not present

## 2019-03-20 DIAGNOSIS — S32810D Multiple fractures of pelvis with stable disruption of pelvic ring, subsequent encounter for fracture with routine healing: Secondary | ICD-10-CM | POA: Diagnosis not present

## 2019-03-20 DIAGNOSIS — N183 Chronic kidney disease, stage 3 (moderate): Secondary | ICD-10-CM | POA: Diagnosis not present

## 2019-03-20 DIAGNOSIS — E559 Vitamin D deficiency, unspecified: Secondary | ICD-10-CM | POA: Diagnosis not present

## 2019-03-20 DIAGNOSIS — M0689 Other specified rheumatoid arthritis, multiple sites: Secondary | ICD-10-CM | POA: Diagnosis not present

## 2019-03-21 ENCOUNTER — Ambulatory Visit (INDEPENDENT_AMBULATORY_CARE_PROVIDER_SITE_OTHER): Payer: Medicare HMO | Admitting: Cardiovascular Disease

## 2019-03-21 ENCOUNTER — Telehealth: Payer: Self-pay

## 2019-03-21 DIAGNOSIS — R001 Bradycardia, unspecified: Secondary | ICD-10-CM | POA: Diagnosis not present

## 2019-03-21 DIAGNOSIS — S32810D Multiple fractures of pelvis with stable disruption of pelvic ring, subsequent encounter for fracture with routine healing: Secondary | ICD-10-CM | POA: Diagnosis not present

## 2019-03-21 DIAGNOSIS — Z8673 Personal history of transient ischemic attack (TIA), and cerebral infarction without residual deficits: Secondary | ICD-10-CM

## 2019-03-21 DIAGNOSIS — Z79899 Other long term (current) drug therapy: Secondary | ICD-10-CM

## 2019-03-21 DIAGNOSIS — E559 Vitamin D deficiency, unspecified: Secondary | ICD-10-CM | POA: Diagnosis not present

## 2019-03-21 DIAGNOSIS — I480 Paroxysmal atrial fibrillation: Secondary | ICD-10-CM

## 2019-03-21 DIAGNOSIS — N183 Chronic kidney disease, stage 3 (moderate): Secondary | ICD-10-CM | POA: Diagnosis not present

## 2019-03-21 DIAGNOSIS — R32 Unspecified urinary incontinence: Secondary | ICD-10-CM | POA: Diagnosis not present

## 2019-03-21 DIAGNOSIS — D631 Anemia in chronic kidney disease: Secondary | ICD-10-CM | POA: Diagnosis not present

## 2019-03-21 DIAGNOSIS — R69 Illness, unspecified: Secondary | ICD-10-CM | POA: Diagnosis not present

## 2019-03-21 DIAGNOSIS — I129 Hypertensive chronic kidney disease with stage 1 through stage 4 chronic kidney disease, or unspecified chronic kidney disease: Secondary | ICD-10-CM | POA: Diagnosis not present

## 2019-03-21 DIAGNOSIS — M0689 Other specified rheumatoid arthritis, multiple sites: Secondary | ICD-10-CM | POA: Diagnosis not present

## 2019-03-21 LAB — POCT INR: INR: 2.5 (ref 2.0–3.0)

## 2019-03-21 NOTE — Telephone Encounter (Signed)
Daughter, [name] calling regarding lab results that you previously ordered. Has some questions on the results. [name] requesting a call back from provider.

## 2019-03-21 NOTE — Patient Instructions (Signed)
Description   Spoke with Hilda Blades [facility] RN with [facility], advised pt to continue taking 5mg  daily, 2.5mg  on Tuesday, Thursday, and Saturday. Recheck INR in 1 week.  Call if placed on any new medications [phone]. Keep intake of greens consistent.

## 2019-03-22 ENCOUNTER — Telehealth: Payer: Self-pay | Admitting: Internal Medicine

## 2019-03-22 DIAGNOSIS — S32810D Multiple fractures of pelvis with stable disruption of pelvic ring, subsequent encounter for fracture with routine healing: Secondary | ICD-10-CM | POA: Diagnosis not present

## 2019-03-22 DIAGNOSIS — I480 Paroxysmal atrial fibrillation: Secondary | ICD-10-CM | POA: Diagnosis not present

## 2019-03-22 DIAGNOSIS — R001 Bradycardia, unspecified: Secondary | ICD-10-CM | POA: Diagnosis not present

## 2019-03-22 DIAGNOSIS — E559 Vitamin D deficiency, unspecified: Secondary | ICD-10-CM | POA: Diagnosis not present

## 2019-03-22 DIAGNOSIS — R32 Unspecified urinary incontinence: Secondary | ICD-10-CM | POA: Diagnosis not present

## 2019-03-22 DIAGNOSIS — M0689 Other specified rheumatoid arthritis, multiple sites: Secondary | ICD-10-CM | POA: Diagnosis not present

## 2019-03-22 DIAGNOSIS — I129 Hypertensive chronic kidney disease with stage 1 through stage 4 chronic kidney disease, or unspecified chronic kidney disease: Secondary | ICD-10-CM | POA: Diagnosis not present

## 2019-03-22 DIAGNOSIS — R69 Illness, unspecified: Secondary | ICD-10-CM | POA: Diagnosis not present

## 2019-03-22 DIAGNOSIS — N183 Chronic kidney disease, stage 3 (moderate): Secondary | ICD-10-CM | POA: Diagnosis not present

## 2019-03-22 DIAGNOSIS — D631 Anemia in chronic kidney disease: Secondary | ICD-10-CM | POA: Diagnosis not present

## 2019-03-22 NOTE — Telephone Encounter (Signed)
Called home care at [phone].  Lori from [place] was calling to get INR orders signed. Cecille Rubin was out to lunch.  The home care provider is PCP.   He is followed in our coumadin clinic.   Will forward to coumadin clinic to look for orders to be signed if Dr. [name] is to sign them.

## 2019-03-22 NOTE — Telephone Encounter (Signed)
New  Message            [facility] is calling to get the order signed that was faxed on the 23rd, And dated and faxed back Order # [id number] s/w with Aura Dials also refaxed this on 03/20/19. Order # [id number] need to be signed, dated and faxed back. [phone].

## 2019-03-22 NOTE — Telephone Encounter (Signed)
LM for Cecille Rubin as orders were signed and faxed in yesterday. I will clarify with medical records of confirmation.

## 2019-03-22 NOTE — Telephone Encounter (Signed)
Spoke with Erica Carroll who states she received the signed order but it did not have a date. I will date and have medical records re send.   Erica Carroll will also be sending additional orders to be signed. She understands Dr [name] is out of the office until July 8. She can anticipate receiving them then.

## 2019-03-23 DIAGNOSIS — R001 Bradycardia, unspecified: Secondary | ICD-10-CM | POA: Diagnosis not present

## 2019-03-23 DIAGNOSIS — E559 Vitamin D deficiency, unspecified: Secondary | ICD-10-CM | POA: Diagnosis not present

## 2019-03-23 DIAGNOSIS — S32810D Multiple fractures of pelvis with stable disruption of pelvic ring, subsequent encounter for fracture with routine healing: Secondary | ICD-10-CM | POA: Diagnosis not present

## 2019-03-23 DIAGNOSIS — M0689 Other specified rheumatoid arthritis, multiple sites: Secondary | ICD-10-CM | POA: Diagnosis not present

## 2019-03-23 DIAGNOSIS — D631 Anemia in chronic kidney disease: Secondary | ICD-10-CM | POA: Diagnosis not present

## 2019-03-23 DIAGNOSIS — R69 Illness, unspecified: Secondary | ICD-10-CM | POA: Diagnosis not present

## 2019-03-23 DIAGNOSIS — R32 Unspecified urinary incontinence: Secondary | ICD-10-CM | POA: Diagnosis not present

## 2019-03-23 DIAGNOSIS — N183 Chronic kidney disease, stage 3 (moderate): Secondary | ICD-10-CM | POA: Diagnosis not present

## 2019-03-23 DIAGNOSIS — I480 Paroxysmal atrial fibrillation: Secondary | ICD-10-CM | POA: Diagnosis not present

## 2019-03-23 DIAGNOSIS — I129 Hypertensive chronic kidney disease with stage 1 through stage 4 chronic kidney disease, or unspecified chronic kidney disease: Secondary | ICD-10-CM | POA: Diagnosis not present

## 2019-03-26 DIAGNOSIS — R32 Unspecified urinary incontinence: Secondary | ICD-10-CM | POA: Diagnosis not present

## 2019-03-26 DIAGNOSIS — E559 Vitamin D deficiency, unspecified: Secondary | ICD-10-CM | POA: Diagnosis not present

## 2019-03-26 DIAGNOSIS — M0689 Other specified rheumatoid arthritis, multiple sites: Secondary | ICD-10-CM | POA: Diagnosis not present

## 2019-03-26 DIAGNOSIS — S32810D Multiple fractures of pelvis with stable disruption of pelvic ring, subsequent encounter for fracture with routine healing: Secondary | ICD-10-CM | POA: Diagnosis not present

## 2019-03-26 DIAGNOSIS — I129 Hypertensive chronic kidney disease with stage 1 through stage 4 chronic kidney disease, or unspecified chronic kidney disease: Secondary | ICD-10-CM | POA: Diagnosis not present

## 2019-03-26 DIAGNOSIS — I480 Paroxysmal atrial fibrillation: Secondary | ICD-10-CM | POA: Diagnosis not present

## 2019-03-26 DIAGNOSIS — D631 Anemia in chronic kidney disease: Secondary | ICD-10-CM | POA: Diagnosis not present

## 2019-03-26 DIAGNOSIS — N183 Chronic kidney disease, stage 3 (moderate): Secondary | ICD-10-CM | POA: Diagnosis not present

## 2019-03-26 DIAGNOSIS — R69 Illness, unspecified: Secondary | ICD-10-CM | POA: Diagnosis not present

## 2019-03-26 DIAGNOSIS — R001 Bradycardia, unspecified: Secondary | ICD-10-CM | POA: Diagnosis not present

## 2019-03-27 DIAGNOSIS — E559 Vitamin D deficiency, unspecified: Secondary | ICD-10-CM | POA: Diagnosis not present

## 2019-03-27 DIAGNOSIS — D631 Anemia in chronic kidney disease: Secondary | ICD-10-CM | POA: Diagnosis not present

## 2019-03-27 DIAGNOSIS — M0689 Other specified rheumatoid arthritis, multiple sites: Secondary | ICD-10-CM | POA: Diagnosis not present

## 2019-03-27 DIAGNOSIS — I129 Hypertensive chronic kidney disease with stage 1 through stage 4 chronic kidney disease, or unspecified chronic kidney disease: Secondary | ICD-10-CM | POA: Diagnosis not present

## 2019-03-27 DIAGNOSIS — R32 Unspecified urinary incontinence: Secondary | ICD-10-CM | POA: Diagnosis not present

## 2019-03-27 DIAGNOSIS — R001 Bradycardia, unspecified: Secondary | ICD-10-CM | POA: Diagnosis not present

## 2019-03-27 DIAGNOSIS — I480 Paroxysmal atrial fibrillation: Secondary | ICD-10-CM | POA: Diagnosis not present

## 2019-03-27 DIAGNOSIS — R69 Illness, unspecified: Secondary | ICD-10-CM | POA: Diagnosis not present

## 2019-03-27 DIAGNOSIS — N183 Chronic kidney disease, stage 3 (moderate): Secondary | ICD-10-CM | POA: Diagnosis not present

## 2019-03-27 DIAGNOSIS — S32810D Multiple fractures of pelvis with stable disruption of pelvic ring, subsequent encounter for fracture with routine healing: Secondary | ICD-10-CM | POA: Diagnosis not present

## 2019-03-28 ENCOUNTER — Ambulatory Visit (INDEPENDENT_AMBULATORY_CARE_PROVIDER_SITE_OTHER): Payer: Medicare HMO | Admitting: Internal Medicine

## 2019-03-28 DIAGNOSIS — R69 Illness, unspecified: Secondary | ICD-10-CM | POA: Diagnosis not present

## 2019-03-28 DIAGNOSIS — D631 Anemia in chronic kidney disease: Secondary | ICD-10-CM | POA: Diagnosis not present

## 2019-03-28 DIAGNOSIS — R32 Unspecified urinary incontinence: Secondary | ICD-10-CM | POA: Diagnosis not present

## 2019-03-28 DIAGNOSIS — Z79899 Other long term (current) drug therapy: Secondary | ICD-10-CM | POA: Diagnosis not present

## 2019-03-28 DIAGNOSIS — Z7901 Long term (current) use of anticoagulants: Secondary | ICD-10-CM | POA: Diagnosis not present

## 2019-03-28 DIAGNOSIS — R001 Bradycardia, unspecified: Secondary | ICD-10-CM | POA: Diagnosis not present

## 2019-03-28 DIAGNOSIS — S32810D Multiple fractures of pelvis with stable disruption of pelvic ring, subsequent encounter for fracture with routine healing: Secondary | ICD-10-CM | POA: Diagnosis not present

## 2019-03-28 DIAGNOSIS — I480 Paroxysmal atrial fibrillation: Secondary | ICD-10-CM | POA: Diagnosis not present

## 2019-03-28 DIAGNOSIS — I129 Hypertensive chronic kidney disease with stage 1 through stage 4 chronic kidney disease, or unspecified chronic kidney disease: Secondary | ICD-10-CM | POA: Diagnosis not present

## 2019-03-28 DIAGNOSIS — N183 Chronic kidney disease, stage 3 (moderate): Secondary | ICD-10-CM | POA: Diagnosis not present

## 2019-03-28 DIAGNOSIS — E559 Vitamin D deficiency, unspecified: Secondary | ICD-10-CM | POA: Diagnosis not present

## 2019-03-28 DIAGNOSIS — M0689 Other specified rheumatoid arthritis, multiple sites: Secondary | ICD-10-CM | POA: Diagnosis not present

## 2019-03-28 LAB — POCT INR: INR: 2.4 (ref 2.0–3.0)

## 2019-03-28 NOTE — Patient Instructions (Addendum)
Description   Spoke with Hilda Blades [facility] RN with [facility], advised pt to continue taking 5mg  daily, 2.5mg  on Tuesday and Saturdays. Recheck INR in 1 week.  Call if placed on any new medications [phone]. Keep intake of greens consistent.

## 2019-03-30 DIAGNOSIS — S32591A Other specified fracture of right pubis, initial encounter for closed fracture: Secondary | ICD-10-CM | POA: Diagnosis not present

## 2019-03-31 ENCOUNTER — Other Ambulatory Visit: Payer: Self-pay | Admitting: Internal Medicine

## 2019-03-31 DIAGNOSIS — I480 Paroxysmal atrial fibrillation: Secondary | ICD-10-CM

## 2019-04-04 ENCOUNTER — Ambulatory Visit (INDEPENDENT_AMBULATORY_CARE_PROVIDER_SITE_OTHER): Payer: Medicare HMO | Admitting: Internal Medicine

## 2019-04-04 ENCOUNTER — Encounter: Payer: Self-pay | Admitting: Internal Medicine

## 2019-04-04 DIAGNOSIS — Z8673 Personal history of transient ischemic attack (TIA), and cerebral infarction without residual deficits: Secondary | ICD-10-CM

## 2019-04-04 DIAGNOSIS — I480 Paroxysmal atrial fibrillation: Secondary | ICD-10-CM | POA: Diagnosis not present

## 2019-04-04 DIAGNOSIS — Z79899 Other long term (current) drug therapy: Secondary | ICD-10-CM | POA: Diagnosis not present

## 2019-04-04 LAB — POCT INR: INR: 3.1 — AB (ref 2.0–3.0)

## 2019-04-04 NOTE — Patient Instructions (Signed)
Description   Spoke with Hilda Blades [facility] RN with [facility], advised pt to take 2.5mg  today then continue taking 5mg  daily, 2.5mg  on Tuesday and Saturdays. Recheck INR in 1 week.  Call if placed on any new medications [phone]. Keep intake of greens consistent.

## 2019-04-10 ENCOUNTER — Encounter: Payer: Medicare HMO | Admitting: *Deleted

## 2019-04-11 ENCOUNTER — Ambulatory Visit (INDEPENDENT_AMBULATORY_CARE_PROVIDER_SITE_OTHER): Payer: Medicare HMO | Admitting: Pharmacist

## 2019-04-11 ENCOUNTER — Telehealth: Payer: Self-pay

## 2019-04-11 DIAGNOSIS — Z79899 Other long term (current) drug therapy: Secondary | ICD-10-CM

## 2019-04-11 DIAGNOSIS — I480 Paroxysmal atrial fibrillation: Secondary | ICD-10-CM

## 2019-04-11 DIAGNOSIS — Z8673 Personal history of transient ischemic attack (TIA), and cerebral infarction without residual deficits: Secondary | ICD-10-CM | POA: Diagnosis not present

## 2019-04-11 LAB — POCT INR: INR: 3 (ref 2.0–3.0)

## 2019-04-11 NOTE — Telephone Encounter (Signed)
Left message for patient to remind of missed remote transmission.  

## 2019-04-17 ENCOUNTER — Ambulatory Visit (INDEPENDENT_AMBULATORY_CARE_PROVIDER_SITE_OTHER): Payer: Medicare HMO | Admitting: *Deleted

## 2019-04-17 DIAGNOSIS — I495 Sick sinus syndrome: Secondary | ICD-10-CM

## 2019-04-17 LAB — CUP PACEART REMOTE DEVICE CHECK
Date Time Interrogation Session: 20200721194300
Implantable Lead Implant Date: 20160518
Implantable Lead Implant Date: 20160518
Implantable Lead Location: 753859
Implantable Lead Location: 753860
Implantable Lead Model: 1944
Implantable Lead Model: 1948
Implantable Pulse Generator Implant Date: 20160518
Pulse Gen Model: 2240
Pulse Gen Serial Number: 7759914

## 2019-04-25 ENCOUNTER — Ambulatory Visit (INDEPENDENT_AMBULATORY_CARE_PROVIDER_SITE_OTHER): Payer: Medicare HMO

## 2019-04-25 ENCOUNTER — Telehealth: Payer: Self-pay | Admitting: Internal Medicine

## 2019-04-25 DIAGNOSIS — I480 Paroxysmal atrial fibrillation: Secondary | ICD-10-CM

## 2019-04-25 DIAGNOSIS — Z8673 Personal history of transient ischemic attack (TIA), and cerebral infarction without residual deficits: Secondary | ICD-10-CM

## 2019-04-25 DIAGNOSIS — Z79899 Other long term (current) drug therapy: Secondary | ICD-10-CM | POA: Diagnosis not present

## 2019-04-25 LAB — POCT INR: INR: 3.1 — AB (ref 2.0–3.0)

## 2019-04-25 NOTE — Telephone Encounter (Signed)
New message   Cecille Rubin is following up on an order faxed on 04/12/19 to [phone]  that needed to be signed and dated by Dr. [name]. The order # [id number]. Please call.

## 2019-04-25 NOTE — Telephone Encounter (Signed)
Order to be re-faxed.

## 2019-04-25 NOTE — Patient Instructions (Signed)
Description   Spoke with [facility] RN with [facility], advised pt to start taking 5mg  daily except 2.5mg  on Tuesdays, Thursdays and Saturdays. Recheck INR in 2 weeks .  Call if placed on any new medications [phone]. Keep intake of greens consistent.

## 2019-04-30 DIAGNOSIS — S32591A Other specified fracture of right pubis, initial encounter for closed fracture: Secondary | ICD-10-CM | POA: Diagnosis not present

## 2019-05-01 ENCOUNTER — Telehealth: Payer: Self-pay | Admitting: Internal Medicine

## 2019-05-01 NOTE — Telephone Encounter (Signed)
Follow Up:    She is following up on the order thatt was resent on 04-25-19. She also faxed  atnother one on 04-26-19. She needs them asap please. Please fax to [phone].

## 2019-05-01 NOTE — Telephone Encounter (Signed)
Orders are awaiting Dr. Olin Pia signature.

## 2019-05-02 ENCOUNTER — Telehealth: Payer: Self-pay | Admitting: *Deleted

## 2019-05-02 NOTE — Telephone Encounter (Signed)
A fax was received from [facility] stating the patient has been discharged from their service, due to meeting the goals for ALL disciplines. Dr Melford Aase is aware.

## 2019-05-02 NOTE — Telephone Encounter (Signed)
Dr [name] not back in this office until the afternoon of August 11.

## 2019-05-02 NOTE — Progress Notes (Signed)
Remote pacemaker transmission.   

## 2019-05-08 ENCOUNTER — Other Ambulatory Visit: Payer: Self-pay | Admitting: Internal Medicine

## 2019-05-08 MED ORDER — METOPROLOL TARTRATE 25 MG PO TABS: ORAL_TABLET | ORAL | 0 refills | Status: DC

## 2019-05-08 NOTE — Telephone Encounter (Signed)
°*  STAT* If patient is at the pharmacy, call can be transferred to refill team.   1. Which medications need to be refilled? (please list name of each medication and dose if known) Metoprolol- pt next appt with Dr [name] is 07-11-19  2. Which pharmacy/location (including street and city if local pharmacy) is medication to be sent to? [facility] on [address]  3. Do they need a 30 day or 90 day supply? 540 and refills

## 2019-05-08 NOTE — Telephone Encounter (Signed)
Pt's medication was sent to pt's pharmacy as requested. Confirmation received.  °

## 2019-05-08 NOTE — Telephone Encounter (Signed)
Signed orders have been faxed 8/11; ls

## 2019-05-15 ENCOUNTER — Telehealth: Payer: Self-pay | Admitting: *Deleted

## 2019-05-15 NOTE — Telephone Encounter (Signed)
Pt was due for a INR check on 05/09/2019. Called [facility], who had been checking her INR's. [facility] stated she was discharged from there services on 04/25/2019. Attempted to get in touch with [facility] to schedule an anticoag visit, LMOM.

## 2019-05-16 NOTE — Telephone Encounter (Signed)
Called spoke to [place], pt's daughter pt is residing with her in [place] at the present since hip injury.  She states she can bring the pt here next week for INR check.  Made appt for Wednesday 05/23/19 at 1:45pm.

## 2019-05-31 DIAGNOSIS — S32591A Other specified fracture of right pubis, initial encounter for closed fracture: Secondary | ICD-10-CM | POA: Diagnosis not present

## 2019-06-12 ENCOUNTER — Ambulatory Visit: Payer: Medicare HMO | Admitting: Internal Medicine

## 2019-06-30 DIAGNOSIS — S32591A Other specified fracture of right pubis, initial encounter for closed fracture: Secondary | ICD-10-CM | POA: Diagnosis not present

## 2019-07-04 ENCOUNTER — Other Ambulatory Visit: Payer: Self-pay

## 2019-07-04 ENCOUNTER — Ambulatory Visit: Payer: Medicare HMO | Admitting: *Deleted

## 2019-07-04 DIAGNOSIS — I480 Paroxysmal atrial fibrillation: Secondary | ICD-10-CM

## 2019-07-04 DIAGNOSIS — Z79899 Other long term (current) drug therapy: Secondary | ICD-10-CM

## 2019-07-04 DIAGNOSIS — Z8673 Personal history of transient ischemic attack (TIA), and cerebral infarction without residual deficits: Secondary | ICD-10-CM | POA: Diagnosis not present

## 2019-07-04 LAB — POCT INR: INR: 1.8 — AB (ref 2.0–3.0)

## 2019-07-04 NOTE — Patient Instructions (Addendum)
Description   Take 7.5mg  today, then continue taking 5mg  daily except 2.5mg  on Tuesdays, Thursdays and Saturdays. Recheck INR in 1 week, appointment with Dr. [name].   Call if placed on any new medications [phone]. Keep intake of greens consistent.

## 2019-07-11 ENCOUNTER — Ambulatory Visit (INDEPENDENT_AMBULATORY_CARE_PROVIDER_SITE_OTHER): Payer: Medicare HMO | Admitting: Internal Medicine

## 2019-07-11 ENCOUNTER — Ambulatory Visit (INDEPENDENT_AMBULATORY_CARE_PROVIDER_SITE_OTHER): Payer: Medicare HMO | Admitting: *Deleted

## 2019-07-11 ENCOUNTER — Other Ambulatory Visit: Payer: Self-pay

## 2019-07-11 ENCOUNTER — Encounter: Payer: Self-pay | Admitting: Internal Medicine

## 2019-07-11 VITALS — BP 130/74 | HR 64 | Ht 64.0 in | Wt 150.4 lb

## 2019-07-11 DIAGNOSIS — Z79899 Other long term (current) drug therapy: Secondary | ICD-10-CM | POA: Diagnosis not present

## 2019-07-11 DIAGNOSIS — Z950 Presence of cardiac pacemaker: Secondary | ICD-10-CM | POA: Diagnosis not present

## 2019-07-11 DIAGNOSIS — Z8673 Personal history of transient ischemic attack (TIA), and cerebral infarction without residual deficits: Secondary | ICD-10-CM | POA: Diagnosis not present

## 2019-07-11 DIAGNOSIS — I495 Sick sinus syndrome: Secondary | ICD-10-CM | POA: Diagnosis not present

## 2019-07-11 DIAGNOSIS — I10 Essential (primary) hypertension: Secondary | ICD-10-CM

## 2019-07-11 DIAGNOSIS — I480 Paroxysmal atrial fibrillation: Secondary | ICD-10-CM

## 2019-07-11 LAB — POCT INR: INR: 2 (ref 2.0–3.0)

## 2019-07-11 NOTE — Progress Notes (Signed)
Patient Care Team: Unk Pinto, MD as PCP - General (Internal Medicine) Stanford Breed Denice Bors, MD as Consulting Physician (Cardiology) Clent Jacks, MD as Consulting Physician (Ophthalmology)   HPI  Erica Carroll is a 85 y.o. female Seen following a recent hospitalization for tachybradycardia syndrome. She underwent pacing. We initiated beta blockers to try to protect against tachyarrhythmia with limitations of up titration related to blood pressure.   Mechanical fall tripping over the coffee table.  Pelvic fracture.  Now back at home.  Denies chest pain shortness of breath or edema.  Does have some wheezing in her throat over the last couple of years.  Seems to be related to eating.  No coughing when she eats.  On Anticoagulation;  No bleeding issues      Date Cr K Hgb   6/18 1.3 4.3 12.3   5/20  1.21 4.3 [id number]    Thromboembolic risk factors ( age -31, TIA/CVA-2, Gender-1) for a CHADSVASc Score of 5    Past Medical History:  Diagnosis Date  . A-fib ([place])    a. Dx 06/2013;  b. 06/2013 Echo:  EF 55-60%, no reg wma, mild AI, mod dil LA.  Marland Kitchen Allergy   . GERD (gastroesophageal reflux disease)   . Hyperlipidemia   . RA (rheumatoid arthritis) ([place])    " in my hands "  . Sinus bradycardia 07/11/2013  . TIA (transient ischemic attack)    a. with fall 05/2013 -> neg head CT and MRI/MRA;  b. Plavix started.  . Urinary incontinence   . Vitamin D deficiency     Past Surgical History:  Procedure Laterality Date  . APPENDECTOMY  1936  . EP IMPLANTABLE DEVICE N/A 02/12/2015   Procedure: Pacemaker Implant;  Surgeon: Deboraha Sprang, MD;  Location: [place] [facility];  Service: Cardiovascular;  Laterality: N/A;  . EYE SURGERY Right 1994   CE/IOL  . EYE SURGERY Left 1998   CE/IOL    Current Outpatient Medications  Medication Sig Dispense Refill  . acetaminophen (TYLENOL) 325 MG tablet Take 325 mg by mouth every 6 (six) hours as needed for mild pain.     Marland Kitchen b complex  vitamins tablet Take 1 tablet by mouth daily.    . Cholecalciferol (VITAMIN D PO) Take 3,000 Units by mouth daily.     Marland Kitchen diltiazem (CARDIZEM) 30 MG tablet Take 1 tablet (30 mg total) by mouth daily as needed. For Afib episodes 30 tablet 10  . GLUCOSAMINE-CHONDROITIN PO Take 1 tablet by mouth daily.    Marland Kitchen MAGNESIUM OXIDE, ANTACID, PO Take 1 tablet by mouth daily.    . metoprolol tartrate (LOPRESSOR) 25 MG tablet Take 3 tablets (75 mg) by mouth twice daily. Please keep upcoming appt in October before anymore refills. Final Attempt 504 tablet 0  . OVER THE COUNTER MEDICATION Take 1 tablet by mouth daily. Preserve vision vitamin    . polyethylene glycol (MIRALAX / GLYCOLAX) packet Take 17 g by mouth daily as needed (constipation).    . warfarin (COUMADIN) 5 MG tablet TAKE 1/2 TO 1 TABLET DAILY AS DIRECTED BY COUMADIN CLINIC. 30 tablet 1   No current facility-administered medications for this visit.     Allergies  Allergen Reactions  . Other Other (See Comments)    News Print: (freshly printed newspapers) cause her eyes to water . Allergy type reaction. Medal: itching & rash    Review of Systems negative except from HPI and PMH  Physical Exam BP  130/74   Pulse 64   Ht 5\' 4"  (1.626 m)   Wt 150 lb 6.4 oz (68.2 kg)   SpO2 97%   BMI 25.82 kg/m  Well developed and well nourished in no acute distress HENT normal Neck supple with JVP-flat Clear Device pocket well healed; without hematoma or erythema.  There is no tethering  Regular rate and rhythm, no * murmur Abd-soft with active BS No Clubbing cyanosis   edema Skin-warm and dry A & Oriented  Grossly normal sensory and motor function  ECG nsr at 64 13/12/44    Assessment and  Plan  Paroxysmal atrial fibrillation with a very rapid rate  Junctional tach  Sinus bradycardia  Hypertension  Pacemaker-[employer]      Infrequent atrial fibrillation   Fall apparently mechanical  Walking with walker   Euvolemic continue  current meds

## 2019-07-11 NOTE — Patient Instructions (Addendum)
Medication Instructions:  Your physician recommends that you continue on your current medications as directed. Please refer to the Current Medication list given to you today.  Labwork: None ordered.  Testing/Procedures: None ordered.  Follow-Up: Your physician wants you to follow-up in: one year with Dr. [name].   You will receive a reminder letter in the mail two months in advance. If you don't receive a letter, please call our office to schedule the follow-up appointment.  Remote monitoring is used to monitor your Pacemaker from home. This monitoring reduces the number of office visits required to check your device to one time per year. It allows [place] to keep an eye on the functioning of your device to ensure it is working properly. You are scheduled for a device check from home on 07/25/2019.  You may send your transmission at any time that day. If you have a wireless device, the transmission will be sent automatically. After your physician reviews your transmission, you will receive a postcard with your next transmission date.  Any Other Special Instructions Will Be Listed Below (If Applicable).  If you need a refill on your cardiac medications before your next appointment, please call your pharmacy.

## 2019-07-11 NOTE — Patient Instructions (Signed)
Description   Today take 7.5mg  then continue taking 5mg  daily except 2.5mg  on Tuesdays, Thursdays and Saturdays. Recheck INR in 2 weeks.  Call if placed on any new medications [phone]. Keep intake of greens consistent.

## 2019-07-12 DIAGNOSIS — R69 Illness, unspecified: Secondary | ICD-10-CM | POA: Diagnosis not present

## 2019-07-17 ENCOUNTER — Ambulatory Visit (INDEPENDENT_AMBULATORY_CARE_PROVIDER_SITE_OTHER): Payer: Medicare HMO | Admitting: *Deleted

## 2019-07-17 DIAGNOSIS — I495 Sick sinus syndrome: Secondary | ICD-10-CM

## 2019-07-17 DIAGNOSIS — R001 Bradycardia, unspecified: Secondary | ICD-10-CM

## 2019-07-17 LAB — CUP PACEART REMOTE DEVICE CHECK
Battery Remaining Longevity: 130 mo
Battery Remaining Percentage: 95.5 %
Battery Voltage: 2.99 V
Brady Statistic AP VP Percent: 1 %
Brady Statistic AP VS Percent: 84 %
Brady Statistic AS VP Percent: 1 %
Brady Statistic AS VS Percent: 16 %
Brady Statistic RA Percent Paced: 83 %
Brady Statistic RV Percent Paced: 1 %
Date Time Interrogation Session: 20201020112720
Implantable Lead Implant Date: 20160518
Implantable Lead Implant Date: 20160518
Implantable Lead Location: 753859
Implantable Lead Location: 753860
Implantable Lead Model: 1944
Implantable Lead Model: 1948
Implantable Pulse Generator Implant Date: 20160518
Lead Channel Impedance Value: 480 Ohm
Lead Channel Impedance Value: 650 Ohm
Lead Channel Pacing Threshold Amplitude: 0.5 V
Lead Channel Pacing Threshold Amplitude: 0.625 V
Lead Channel Pacing Threshold Pulse Width: 0.4 ms
Lead Channel Pacing Threshold Pulse Width: 0.4 ms
Lead Channel Sensing Intrinsic Amplitude: 0.5 mV
Lead Channel Sensing Intrinsic Amplitude: 9.2 mV
Lead Channel Setting Pacing Amplitude: 0.875
Lead Channel Setting Pacing Amplitude: 2 V
Lead Channel Setting Pacing Pulse Width: 0.4 ms
Lead Channel Setting Sensing Sensitivity: 2 mV
Pulse Gen Model: 2240
Pulse Gen Serial Number: 7759914

## 2019-07-23 LAB — CUP PACEART INCLINIC DEVICE CHECK
Battery Remaining Longevity: 127 mo
Battery Voltage: 2.99 V
Brady Statistic RA Percent Paced: 80 %
Brady Statistic RV Percent Paced: 1.1 %
Date Time Interrogation Session: 20201014184339
Implantable Lead Implant Date: 20160518
Implantable Lead Implant Date: 20160518
Implantable Lead Location: 753859
Implantable Lead Location: 753860
Implantable Lead Model: 1944
Implantable Lead Model: 1948
Implantable Pulse Generator Implant Date: 20160518
Lead Channel Impedance Value: 512.5 Ohm
Lead Channel Impedance Value: 650 Ohm
Lead Channel Pacing Threshold Amplitude: 0.5 V
Lead Channel Pacing Threshold Amplitude: 0.625 V
Lead Channel Pacing Threshold Pulse Width: 0.4 ms
Lead Channel Pacing Threshold Pulse Width: 0.4 ms
Lead Channel Sensing Intrinsic Amplitude: 0.6 mV
Lead Channel Sensing Intrinsic Amplitude: 10.2 mV
Lead Channel Setting Pacing Amplitude: 0.875
Lead Channel Setting Pacing Amplitude: 2 V
Lead Channel Setting Pacing Pulse Width: 0.4 ms
Lead Channel Setting Sensing Sensitivity: 2 mV
Pulse Gen Model: 2240
Pulse Gen Serial Number: 7759914

## 2019-07-25 ENCOUNTER — Other Ambulatory Visit: Payer: Self-pay | Admitting: Internal Medicine

## 2019-07-25 MED ORDER — METOPROLOL TARTRATE 25 MG PO TABS: ORAL_TABLET | ORAL | 3 refills | Status: DC

## 2019-07-30 MED ORDER — METOPROLOL TARTRATE 25 MG PO TABS: ORAL_TABLET | ORAL | 3 refills | Status: DC

## 2019-07-30 NOTE — Addendum Note (Signed)
Addended by: Derl Barrow on: 07/30/2019 12:34 PM   Modules accepted: Orders

## 2019-07-31 ENCOUNTER — Encounter: Payer: Self-pay | Admitting: Internal Medicine

## 2019-07-31 DIAGNOSIS — S32591A Other specified fracture of right pubis, initial encounter for closed fracture: Secondary | ICD-10-CM | POA: Diagnosis not present

## 2019-08-02 ENCOUNTER — Ambulatory Visit (INDEPENDENT_AMBULATORY_CARE_PROVIDER_SITE_OTHER): Payer: Medicare HMO | Admitting: *Deleted

## 2019-08-02 ENCOUNTER — Other Ambulatory Visit: Payer: Self-pay

## 2019-08-02 DIAGNOSIS — I480 Paroxysmal atrial fibrillation: Secondary | ICD-10-CM

## 2019-08-02 DIAGNOSIS — Z8673 Personal history of transient ischemic attack (TIA), and cerebral infarction without residual deficits: Secondary | ICD-10-CM | POA: Diagnosis not present

## 2019-08-02 DIAGNOSIS — Z5181 Encounter for therapeutic drug level monitoring: Secondary | ICD-10-CM

## 2019-08-02 DIAGNOSIS — Z79899 Other long term (current) drug therapy: Secondary | ICD-10-CM

## 2019-08-02 LAB — POCT INR: INR: 2.1 (ref 2.0–3.0)

## 2019-08-02 NOTE — Patient Instructions (Signed)
Description   Continue taking 5mg  daily except 2.5mg  on Tuesdays, Thursdays and Saturdays. Recheck INR in 4 weeks.  Call if placed on any new medications [phone]. Keep intake of greens consistent.

## 2019-08-02 NOTE — Progress Notes (Signed)
Remote pacemaker transmission.   

## 2019-08-27 ENCOUNTER — Other Ambulatory Visit: Payer: Self-pay

## 2019-08-27 DIAGNOSIS — I480 Paroxysmal atrial fibrillation: Secondary | ICD-10-CM

## 2019-08-27 MED ORDER — WARFARIN SODIUM 5 MG PO TABS: ORAL_TABLET | ORAL | 1 refills | Status: DC

## 2019-08-30 DIAGNOSIS — S32591A Other specified fracture of right pubis, initial encounter for closed fracture: Secondary | ICD-10-CM | POA: Diagnosis not present

## 2019-09-25 DIAGNOSIS — R32 Unspecified urinary incontinence: Secondary | ICD-10-CM | POA: Diagnosis not present

## 2019-09-25 DIAGNOSIS — I4891 Unspecified atrial fibrillation: Secondary | ICD-10-CM | POA: Diagnosis not present

## 2019-09-25 DIAGNOSIS — R69 Illness, unspecified: Secondary | ICD-10-CM | POA: Diagnosis not present

## 2019-09-25 DIAGNOSIS — Z7901 Long term (current) use of anticoagulants: Secondary | ICD-10-CM | POA: Diagnosis not present

## 2019-09-25 DIAGNOSIS — I10 Essential (primary) hypertension: Secondary | ICD-10-CM | POA: Diagnosis not present

## 2019-09-25 DIAGNOSIS — D6869 Other thrombophilia: Secondary | ICD-10-CM | POA: Diagnosis not present

## 2019-09-25 DIAGNOSIS — Z833 Family history of diabetes mellitus: Secondary | ICD-10-CM | POA: Diagnosis not present

## 2019-09-25 DIAGNOSIS — Z809 Family history of malignant neoplasm, unspecified: Secondary | ICD-10-CM | POA: Diagnosis not present

## 2019-09-25 DIAGNOSIS — Z9181 History of falling: Secondary | ICD-10-CM | POA: Diagnosis not present

## 2019-09-25 DIAGNOSIS — Z8249 Family history of ischemic heart disease and other diseases of the circulatory system: Secondary | ICD-10-CM | POA: Diagnosis not present

## 2019-09-30 DIAGNOSIS — S32591A Other specified fracture of right pubis, initial encounter for closed fracture: Secondary | ICD-10-CM | POA: Diagnosis not present

## 2019-10-04 ENCOUNTER — Ambulatory Visit: Payer: Self-pay | Admitting: Adult Health

## 2019-10-16 ENCOUNTER — Ambulatory Visit (INDEPENDENT_AMBULATORY_CARE_PROVIDER_SITE_OTHER): Payer: Medicare HMO | Admitting: *Deleted

## 2019-10-16 DIAGNOSIS — I495 Sick sinus syndrome: Secondary | ICD-10-CM

## 2019-10-16 LAB — CUP PACEART REMOTE DEVICE CHECK
Battery Remaining Longevity: 128 mo
Battery Remaining Percentage: 95.5 %
Battery Voltage: 2.99 V
Brady Statistic AP VP Percent: 1.1 %
Brady Statistic AP VS Percent: 82 %
Brady Statistic AS VP Percent: 1 %
Brady Statistic AS VS Percent: 16 %
Brady Statistic RA Percent Paced: 77 %
Brady Statistic RV Percent Paced: 1.2 %
Date Time Interrogation Session: 20210119093029
Implantable Lead Implant Date: 20160518
Implantable Lead Implant Date: 20160518
Implantable Lead Location: 753859
Implantable Lead Location: 753860
Implantable Lead Model: 1944
Implantable Lead Model: 1948
Implantable Pulse Generator Implant Date: 20160518
Lead Channel Impedance Value: 510 Ohm
Lead Channel Impedance Value: 630 Ohm
Lead Channel Pacing Threshold Amplitude: 0.5 V
Lead Channel Pacing Threshold Amplitude: 0.625 V
Lead Channel Pacing Threshold Pulse Width: 0.4 ms
Lead Channel Pacing Threshold Pulse Width: 0.4 ms
Lead Channel Sensing Intrinsic Amplitude: 0.6 mV
Lead Channel Sensing Intrinsic Amplitude: 8.3 mV
Lead Channel Setting Pacing Amplitude: 0.875
Lead Channel Setting Pacing Amplitude: 2 V
Lead Channel Setting Pacing Pulse Width: 0.4 ms
Lead Channel Setting Sensing Sensitivity: 2 mV
Pulse Gen Model: 2240
Pulse Gen Serial Number: 7759914

## 2019-10-17 ENCOUNTER — Ambulatory Visit: Payer: Medicare Other | Attending: Internal Medicine

## 2019-10-17 DIAGNOSIS — Z23 Encounter for immunization: Secondary | ICD-10-CM | POA: Insufficient documentation

## 2019-10-17 NOTE — Progress Notes (Signed)
   Covid-19 Vaccination Clinic  Name:  RENIE TENNY    MRN: [id number] DOB: 1922/01/08  10/17/2019  Ms. O[place]aca was observed post Covid-19 immunization for 15 minutes without incidence. She was provided with Vaccine Information Sheet and instruction to access the V-Safe system.   Ms. Pertuit was instructed to call 911 with any severe reactions post vaccine: Marland Kitchen Difficulty breathing  . Swelling of your face and throat  . A fast heartbeat  . A bad rash all over your body  . Dizziness and weakness    Immunizations Administered    Name Date Dose VIS Date Route   Pfizer COVID-19 Vaccine 10/17/2019  2:07 PM 0.3 mL 09/07/2019 Intramuscular   Manufacturer: [place]   Lot: [id number]   [place]: [id number]

## 2019-10-24 ENCOUNTER — Other Ambulatory Visit: Payer: Self-pay

## 2019-10-24 ENCOUNTER — Ambulatory Visit (INDEPENDENT_AMBULATORY_CARE_PROVIDER_SITE_OTHER): Payer: Medicare HMO

## 2019-10-24 DIAGNOSIS — Z8673 Personal history of transient ischemic attack (TIA), and cerebral infarction without residual deficits: Secondary | ICD-10-CM | POA: Diagnosis not present

## 2019-10-24 DIAGNOSIS — Z79899 Other long term (current) drug therapy: Secondary | ICD-10-CM | POA: Diagnosis not present

## 2019-10-24 DIAGNOSIS — I480 Paroxysmal atrial fibrillation: Secondary | ICD-10-CM | POA: Diagnosis not present

## 2019-10-24 LAB — POCT INR: INR: 2.5 (ref 2.0–3.0)

## 2019-10-24 NOTE — Patient Instructions (Signed)
Description   Continue taking 5mg  daily except 2.5mg  on Tuesdays, Thursdays and Saturdays. Recheck INR in 6 weeks.  Call if placed on any new medications [phone]. Keep intake of greens consistent.

## 2019-10-30 ENCOUNTER — Other Ambulatory Visit: Payer: Self-pay | Admitting: Internal Medicine

## 2019-10-30 DIAGNOSIS — I480 Paroxysmal atrial fibrillation: Secondary | ICD-10-CM

## 2019-10-31 DIAGNOSIS — S32591A Other specified fracture of right pubis, initial encounter for closed fracture: Secondary | ICD-10-CM | POA: Diagnosis not present

## 2019-11-07 ENCOUNTER — Ambulatory Visit: Payer: Medicare HMO | Attending: Internal Medicine

## 2019-11-07 DIAGNOSIS — Z23 Encounter for immunization: Secondary | ICD-10-CM

## 2019-11-07 NOTE — Progress Notes (Signed)
   Covid-19 Vaccination Clinic  Name:  Erica Carroll    MRN: [id number] DOB: 1922-02-02  11/07/2019    [name] was observed post Covid-19 immunization for 15 minutes without incidence. She was provided with Vaccine Information Sheet and instruction to access the V-Safe system.   [name] was instructed to call 911 with any severe reactions post vaccine: Marland Kitchen Difficulty breathing  . Swelling of your face and throat  . A fast heartbeat  . A bad rash all over your body  . Dizziness and weakness    Immunizations Administered    Name Date Dose VIS Date Route   Pfizer COVID-19 Vaccine 11/07/2019  5:17 PM 0.3 mL 09/07/2019 Intramuscular   Manufacturer: [place]   Lot: [id number]   [place]: [id number]

## 2019-11-28 DIAGNOSIS — S32591A Other specified fracture of right pubis, initial encounter for closed fracture: Secondary | ICD-10-CM | POA: Diagnosis not present

## 2020-01-11 ENCOUNTER — Other Ambulatory Visit: Payer: Self-pay

## 2020-01-11 ENCOUNTER — Ambulatory Visit (INDEPENDENT_AMBULATORY_CARE_PROVIDER_SITE_OTHER): Payer: Medicare HMO | Admitting: *Deleted

## 2020-01-11 DIAGNOSIS — Z8673 Personal history of transient ischemic attack (TIA), and cerebral infarction without residual deficits: Secondary | ICD-10-CM | POA: Diagnosis not present

## 2020-01-11 DIAGNOSIS — Z5181 Encounter for therapeutic drug level monitoring: Secondary | ICD-10-CM

## 2020-01-11 DIAGNOSIS — Z79899 Other long term (current) drug therapy: Secondary | ICD-10-CM | POA: Diagnosis not present

## 2020-01-11 DIAGNOSIS — I480 Paroxysmal atrial fibrillation: Secondary | ICD-10-CM

## 2020-01-11 LAB — POCT INR: INR: 2.9 (ref 2.0–3.0)

## 2020-01-11 NOTE — Patient Instructions (Signed)
Description   Continue taking 5mg  daily except 2.5mg  on Tuesdays, Thursdays and Saturdays. Recheck INR in 7 weeks.  Call if placed on any new medications [phone]. Keep intake of greens consistent.

## 2020-01-15 ENCOUNTER — Ambulatory Visit (INDEPENDENT_AMBULATORY_CARE_PROVIDER_SITE_OTHER): Payer: Medicare HMO | Admitting: *Deleted

## 2020-01-15 DIAGNOSIS — I495 Sick sinus syndrome: Secondary | ICD-10-CM

## 2020-01-15 LAB — CUP PACEART REMOTE DEVICE CHECK
Battery Remaining Longevity: 128 mo
Battery Remaining Percentage: 95.5 %
Battery Voltage: 2.99 V
Brady Statistic AP VP Percent: 9.9 %
Brady Statistic AP VS Percent: 79 %
Brady Statistic AS VP Percent: 1 %
Brady Statistic AS VS Percent: 9.6 %
Brady Statistic RA Percent Paced: 81 %
Brady Statistic RV Percent Paced: 9.5 %
Date Time Interrogation Session: 20210420051152
Implantable Lead Implant Date: 20160518
Implantable Lead Implant Date: 20160518
Implantable Lead Location: 753859
Implantable Lead Location: 753860
Implantable Lead Model: 1944
Implantable Lead Model: 1948
Implantable Pulse Generator Implant Date: 20160518
Lead Channel Impedance Value: 540 Ohm
Lead Channel Impedance Value: 630 Ohm
Lead Channel Pacing Threshold Amplitude: 0.5 V
Lead Channel Pacing Threshold Amplitude: 0.625 V
Lead Channel Pacing Threshold Pulse Width: 0.4 ms
Lead Channel Pacing Threshold Pulse Width: 0.4 ms
Lead Channel Sensing Intrinsic Amplitude: 0.4 mV
Lead Channel Sensing Intrinsic Amplitude: 9 mV
Lead Channel Setting Pacing Amplitude: 0.875
Lead Channel Setting Pacing Amplitude: 2 V
Lead Channel Setting Pacing Pulse Width: 0.4 ms
Lead Channel Setting Sensing Sensitivity: 2 mV
Pulse Gen Model: 2240
Pulse Gen Serial Number: 7759914

## 2020-01-16 NOTE — Progress Notes (Signed)
PPM Remote  

## 2020-02-14 ENCOUNTER — Other Ambulatory Visit: Payer: Self-pay

## 2020-02-14 DIAGNOSIS — I480 Paroxysmal atrial fibrillation: Secondary | ICD-10-CM

## 2020-02-14 MED ORDER — WARFARIN SODIUM 5 MG PO TABS: ORAL_TABLET | ORAL | 2 refills | Status: DC

## 2020-02-29 ENCOUNTER — Other Ambulatory Visit: Payer: Self-pay

## 2020-02-29 ENCOUNTER — Ambulatory Visit (INDEPENDENT_AMBULATORY_CARE_PROVIDER_SITE_OTHER): Payer: Medicare HMO

## 2020-02-29 DIAGNOSIS — Z8673 Personal history of transient ischemic attack (TIA), and cerebral infarction without residual deficits: Secondary | ICD-10-CM

## 2020-02-29 DIAGNOSIS — Z79899 Other long term (current) drug therapy: Secondary | ICD-10-CM

## 2020-02-29 DIAGNOSIS — I480 Paroxysmal atrial fibrillation: Secondary | ICD-10-CM

## 2020-02-29 LAB — POCT INR: INR: 1.5 — AB (ref 2.0–3.0)

## 2020-02-29 NOTE — Patient Instructions (Signed)
Description   Take 1.5 tablets today, then take 1 tablet tomorrow, then resume same dosage 5mg  daily except 2.5mg  on Tuesdays, Thursdays and Saturdays. Recheck INR in 3 weeks.  Call if placed on any new medications [phone]. Keep intake of greens consistent.

## 2020-03-05 ENCOUNTER — Encounter: Payer: Self-pay | Admitting: Physician Assistant

## 2020-03-05 DIAGNOSIS — D6869 Other thrombophilia: Secondary | ICD-10-CM | POA: Insufficient documentation

## 2020-03-05 NOTE — Progress Notes (Deleted)
MEDICARE ANNUAL WELLNESS VISIT AND FOLLOW UP  Assessment:    Encounter for Medicare annual wellness exam  Tachy-brady syndrome ([place]) Controlled, s/p pacemaker, followed by cardiology  Sinus bradycardia S/p pacemaker  Paroxysmal atrial fibrillation ([place]) Rate controlled, on coumadin followed by cardiology/coumadin clinic Discussed if patient falls to immediately contact office or go to ER. Discussed foods that can increase or decrease Coumadin levels. Patient understands to call the office before starting a new medication.  History of TIA (transient ischemic attack) Control blood pressure, cholesterol, glucose, increase exercise.   Essential hypertension Continue medication Monitor blood pressure at home; call if consistently over 130/80 Continue DASH diet.   Reminder to go to the ER if any CP, SOB, nausea, dizziness, severe HA, changes vision/speech, left arm numbness and tingling and jaw pain. -     Magnesium  Gastroesophageal reflux disease, esophagitis presence not specified Well managed at this time off of medications Discussed diet, avoiding triggers and other lifestyle changes  CKD stage G3b/A1, GFR 30-44 and albumin creatinine ratio <30 mg/g (HCC) Increase fluids, avoid NSAIDS, monitor sugars, will monitor -     COMPLETE METABOLIC PANEL WITH GFR  Vitamin D deficiency Continue supplementation Check vitamin D level -     VITAMIN D 25 Hydroxy (Vit-D Deficiency, Fractures)  Other abnormal glucose Recent A1Cs at goal Discussed diet/exercise, weight management  Defer A1C; check CMP -     COMPLETE METABOLIC PANEL WITH GFR  Medication management -     CBC with Differential/Platelet -     COMPLETE METABOLIC PANEL WITH GFR -     Magnesium  Long term current use of anticoagulant therapy Followed by coumadin clinic  Hyperlipidemia, unspecified hyperlipidemia type At goal by lifestyle modification Continue low cholesterol diet and exercise.  Check lipid panel  annually at CPE -     TSH  Need for immunization against influenza -     Flu vaccine HIGH DOSE PF  Fatigue, unspecified type -     CBC with Differential/Platelet -     TSH -     Vitamin B12  Estrogen deficiency -     DG Bone Density; Future  Mild dementia ([place]) Son is living with her, helps with meds, housework No falls, family and patient feels she is doing well without need for additional resources   Over 40 minutes of exam, counseling, chart review and critical decision making was performed Future Appointments  Date Time Provider [place]  03/06/2020  2:30 PM Vicie Mutters, PA-C [id number] None  03/21/2020  1:00 PM [facility]  04/15/2020  7:35 AM [facility] DEVICE REMOTES [facility]  07/15/2020  7:35 AM [facility] DEVICE REMOTES [facility]     Plan:   During the course of the visit the patient was educated and counseled about appropriate screening and preventive services including:    Pneumococcal vaccine   Prevnar 13  Influenza vaccine  Td vaccine  Screening electrocardiogram  Bone densitometry screening  Colorectal cancer screening  Diabetes screening  Glaucoma screening  Nutrition counseling   Advanced directives: requested   Subjective:  Erica Carroll is a 86 y.o. female who presents for Medicare Annual Wellness Visit and 6 month follow up.   In Sept 2014 when she presented with rapid Afib. She had a TIA at that time & was started on Plavix and after a 2sd TIA was switched to Coumadin.  In 2016, she had a St Jude PPM implanted by Dr [name] for SSS w/ Tachy/Brady  Syn.   She has some mild memory loss, forgetting how to cook, recipes, etc. She lives at home, but her son has moved in with her and helps with housework, cooking, etc and checks on her throughout the day. Her daughter drives her to Dr's appointments.  She has not fallen this past year, reports steady with  current 4 point cane. She denies incontinence.   She had a mechanical fall in 02/21/2019 that resulted in a pelvic fracture.   She follows with a. Fib clinic for coumadin checks. Patient's last INR is  Lab Results  Component Value Date   INR 1.5 (A) 02/29/2020   INR 2.9 01/11/2020   INR 2.5 10/24/2019   BMI is There is no height or weight on file to calculate BMI., she has not been working on diet and exercise. Wt Readings from Last 3 Encounters:  07/11/19 150 lb 6.4 oz (68.2 kg)  02/28/19 159 lb (72.1 kg)  10/02/18 149 lb (67.6 kg)    Her blood pressure has been controlled at home, today their BP is   She does workout. She denies chest pain, shortness of breath, dizziness.   She is not on cholesterol medication and denies myalgias. Her cholesterol is at goal. The cholesterol last visit was:   Lab Results  Component Value Date   CHOL 145 03/28/2018   HDL 47 (L) 03/28/2018   LDLCALC 77 03/28/2018   TRIG 124 03/28/2018   CHOLHDL 3.1 03/28/2018    She has been working on diet and exercise for glucose management, and denies increased appetite, nausea, paresthesia of the feet, polydipsia, polyuria and visual disturbances. Last A1C in the office was:  Lab Results  Component Value Date   HGBA1C 5.4 03/28/2018   Last GFR: Lab Results  Component Value Date   GFRNONAA 38 (L) 02/22/2019   Patient is on Vitamin D supplement.   Lab Results  Component Value Date   VD25OH 43 10/02/2018       Medication Review:   Current Outpatient Medications (Cardiovascular):  .  diltiazem (CARDIZEM) 30 MG tablet, Take 1 tablet (30 mg total) by mouth daily as needed. For Afib episodes .  metoprolol tartrate (LOPRESSOR) 25 MG tablet, Take 3 tablets (75 mg) by mouth twice daily.   Current Outpatient Medications (Analgesics):  .  acetaminophen (TYLENOL) 325 MG tablet, Take 325 mg by mouth every 6 (six) hours as needed for mild pain.   Current Outpatient Medications (Hematological):  .   warfarin (COUMADIN) 5 MG tablet, Take 1/2 a tablet to 1 tablet by mouth daily as directed by the coumadin clinic  Current Outpatient Medications (Other):  .  b complex vitamins tablet, Take 1 tablet by mouth daily. .  Cholecalciferol (VITAMIN D PO), Take 3,000 Units by mouth daily.  Marland Kitchen  GLUCOSAMINE-CHONDROITIN PO, Take 1 tablet by mouth daily. Marland Kitchen  MAGNESIUM OXIDE, ANTACID, PO, Take 1 tablet by mouth daily. Marland Kitchen  OVER THE COUNTER MEDICATION, Take 1 tablet by mouth daily. Preserve vision vitamin .  polyethylene glycol (MIRALAX / GLYCOLAX) packet, Take 17 g by mouth daily as needed (constipation).   Allergies  Allergen Reactions  . Other Other (See Comments)    News Print: (freshly printed newspapers) cause her eyes to water . Allergy type reaction. Medal: itching & rash    Current Problems (verified) Patient Active Problem List   Diagnosis Date Noted  . Anemia 03/08/2019  . Pelvic fracture ([place]) 02/22/2019  . Bilateral fracture of pubic rami ([place]) 02/22/2019  .  Closed fracture of multiple pubic rami, right, initial encounter ([place])   . Mild dementia ([place]) 10/02/2018  . Environmental allergies   . Tachy-brady syndrome ([place]) 02/12/2015  . CKD stage G3b/A1, GFR 30-44 and albumin creatinine ratio <30 mg/g 01/28/2015  . Essential hypertension 01/03/2014  . Other abnormal glucose 01/03/2014  . Medication management 10/31/2013  . Hyperlipidemia   . Vitamin D deficiency   . Long term current use of anticoagulant therapy 07/16/2013  . Sinus bradycardia 07/11/2013  . Paroxysmal atrial fibrillation ([place]) 07/10/2013  . GERD (gastroesophageal reflux disease) 07/10/2013  . History of TIA (transient ischemic attack) 06/25/2013    Screening Tests Immunization History  Administered Date(s) Administered  . Influenza, High Dose Seasonal PF 07/18/2014, 10/02/2018  . Influenza,inj,Quad PF,6+ Mos 07/26/2013, 07/07/2016  . Influenza-Unspecified 05/28/2012  . PFIZER SARS-COV-2 Vaccination 10/17/2019,  11/07/2019  . Pneumococcal Conjugate-13 03/02/2017  . Pneumococcal-Unspecified 11/25/2008  . Td 09/13/2006  . Tdap 12/13/2015    Preventative care: Last colonoscopy: patient declined persistently, now deferred due to age Last mammogram: 03/2017 Last pap smear/pelvic exam: remote   DEXA: never - will order   Prior vaccinations: TD or Tdap: 2017  Influenza: Today 09/2018  Pneumococcal: 2010 Prevnar13: 2018 Shingles/Zostavax: Declines   Names of Other Physician/Practitioners you currently use: 1. [facility] here for primary care 2. Dr. Katy Fitch, eye doctor, last visit 2017?  3. Dr. ? , dentist, last visit 2018  Patient Care Team: Unk Pinto, MD as PCP - General (Internal Medicine) Stanford Breed Denice Bors, MD as Consulting Physician (Cardiology) Clent Jacks, MD as Consulting Physician (Ophthalmology)  SURGICAL HISTORY She  has a past surgical history that includes Eye surgery (Right, 1994); Eye surgery (Left, 1998); Appendectomy (1936); and Cardiac catheterization (N/A, 02/12/2015). FAMILY HISTORY Her family history includes CVA in her sister; Heart disease in her brother, brother, mother, and sister; Thyroid disease in her sister. SOCIAL HISTORY She  reports that she has never smoked. She has never used smokeless tobacco. She reports current alcohol use. She reports that she does not use drugs.   MEDICARE WELLNESS OBJECTIVES: Physical activity:   Cardiac risk factors:   Depression/mood screen:   Depression screen [facility] 2/9 10/02/2018  Decreased Interest 0  Down, Depressed, Hopeless 0  PHQ - 2 Score 0    ADLs:  No flowsheet data found.   Cognitive Testing  Alert? Yes  Normal Appearance?Yes  Oriented to person? Yes  Place? Yes   Time? Yes  Recall of three objects?  Yes 3/3 - after repitition  Can perform simple calculations? Yes  Displays appropriate judgment?Yes  Can read the correct time from a watch face?Yes  EOL planning:     Review of Systems  Constitutional: Positive for malaise/fatigue. Negative for weight loss.  HENT: Negative for hearing loss and tinnitus.   Eyes: Negative for blurred vision and double vision.  Respiratory: Negative for cough, sputum production, shortness of breath and wheezing.   Cardiovascular: Negative for chest pain, palpitations, orthopnea, claudication, leg swelling and PND.  Gastrointestinal: Negative for abdominal pain, blood in stool, constipation, diarrhea, heartburn, melena, nausea and vomiting.  Genitourinary: Negative.   Musculoskeletal: Negative for falls, joint pain and myalgias.  Skin: Negative for rash.  Neurological: Negative for dizziness, tingling, sensory change, weakness and headaches.  Endo/Heme/Allergies: Negative for polydipsia.  Psychiatric/Behavioral: Positive for memory loss (forgetting recipes, how to cook, meds without help). Negative for depression, substance abuse and suicidal ideas. The patient is not nervous/anxious and does not have  insomnia.   All other systems reviewed and are negative.    Objective:     There were no vitals filed for this visit. There is no height or weight on file to calculate BMI.  General appearance: alert, no distress, WD/WN, female HEENT: normocephalic, sclerae anicteric, TMs pearly, nares patent, no discharge or erythema, pharynx normal, very HOH with bilateral hearing aids Oral cavity: MMM, no lesions Neck: supple, no lymphadenopathy, no thyromegaly, no masses Heart: RRR, normal S1, S2, no murmurs Lungs: CTA bilaterally, no wheezes, rhonchi, or rales Abdomen: +bs, soft, non tender, non distended, no masses, no hepatomegaly, no splenomegaly Musculoskeletal: nontender, no swelling, no obvious deformity Extremities: no edema, no cyanosis, no clubbing Pulses: 2+ symmetric, upper and lower extremities, normal cap refill Neurological: alert, oriented x 3, CN2-12 intact, strength normal upper extremities and lower extremities,  sensation normal throughout, DTRs 2+ throughout, no cerebellar signs, gait slow but steady with 4 point cane, slowed though process Psychiatric: normal affect, behavior normal, pleasant   Medicare Attestation I have personally reviewed: The patient's medical and social history Their use of alcohol, tobacco or illicit drugs Their current medications and supplements The patient's functional ability including ADLs,fall risks, home safety risks, cognitive, and hearing and visual impairment Diet and physical activities Evidence for depression or mood disorders  The patient's weight, height, BMI, and visual acuity have been recorded in the chart.  I have made referrals, counseling, and provided education to the patient based on review of the above and I have provided the patient with a written personalized care plan for preventive services.     Vicie Mutters, PA-C   03/05/2020

## 2020-03-06 ENCOUNTER — Ambulatory Visit: Payer: Medicare HMO | Admitting: Physician Assistant

## 2020-03-25 ENCOUNTER — Telehealth: Payer: Self-pay

## 2020-03-25 NOTE — Telephone Encounter (Signed)
Patient overdue for INR check, had to cancel 6/25 due to transportation.  I left message for Della Goo (daughter) [phone] to schedule appointment.

## 2020-04-03 NOTE — Progress Notes (Signed)
MEDICARE ANNUAL WELLNESS VISIT AND FOLLOW UP  Assessment:    Encounter for Medicare annual wellness exam Given information about shingrix  Tachy-brady syndrome (Erica Carroll) Controlled, s/p pacemaker, followed by cardiology  Sinus bradycardia S/p pacemaker  Paroxysmal atrial fibrillation (Erica Carroll) Rate controlled, on coumadin followed by cardiology/coumadin clinic Discussed if patient falls to immediately contact office or go to ER. Discussed foods that can increase or decrease Coumadin levels. Patient understands to call the office before starting a new medication.  History of TIA (transient ischemic attack) Control blood pressure, cholesterol, glucose, increase exercise.   Essential hypertension Continue medication Monitor blood pressure at home; call if consistently over 130/80 Continue DASH diet.   Reminder to go to the ER if any CP, SOB, nausea, dizziness, severe HA, changes vision/speech, left arm numbness and tingling and jaw pain. -     Magnesium  Gastroesophageal reflux disease, esophagitis presence not specified Well managed at this time off of medications Discussed diet, avoiding triggers and other lifestyle changes  CKD stage G3b/A1, GFR 30-44 and albumin creatinine ratio <30 mg/g (HCC) Increase fluids, avoid NSAIDS, monitor sugars, will monitor -     COMPLETE METABOLIC PANEL WITH GFR  Vitamin D deficiency Continue supplementation Check vitamin D level  Other abnormal glucose Recent A1Cs at goal Discussed diet/exercise, weight management  Defer A1C; check CMP -     COMPLETE METABOLIC PANEL WITH GFR  Medication management -     CBC with Differential/Platelet -     COMPLETE METABOLIC PANEL WITH GFR -     Magnesium  Long term current use of anticoagulant therapy Followed by coumadin clinic however since patient is overdue and in the office, will check today with them to reschedule with coumadin clinic  Hyperlipidemia, unspecified hyperlipidemia type At goal by  lifestyle modification Continue low cholesterol diet and exercise.   Mild dementia (Erica Carroll) Son is living with her, helps with meds, housework  family and patient feels she is doing well without need for additional resources Discussed adding on namenda as a trial for moderate to severe dementia- given information  Ear cerumen - stop using Qtips, irrigation used in the office without complications, use OTC drops/oil at home to prevent reoccurence  Over 40 minutes of exam, counseling, chart review and critical decision making was performed Future Appointments  Date Time Provider [place]  04/15/2020  7:35 AM [facility] DEVICE REMOTES [facility]  07/15/2020  7:35 AM [facility] DEVICE REMOTES [facility]     Plan:   During the course of the visit the patient was educated and counseled about appropriate screening and preventive services including:    Pneumococcal vaccine   Prevnar 13  Influenza vaccine  Td vaccine  Screening electrocardiogram  Bone densitometry screening  Colorectal cancer screening  Diabetes screening  Glaucoma screening  Nutrition counseling   Advanced directives: requested   Subjective:  Erica Carroll is a 86 y.o. female who presents for Medicare Annual Wellness Visit and 6 month follow up.   In Sept 2014 when she presented with rapid Afib. She had a TIA at that time & was started on Plavix and after a 2sd TIA was switched to Coumadin.  In 2016, she had a St Jude PPM implanted by Dr Erica Carroll for SSS w/ Tachy/Brady Syn.   She has some mild memory loss, forgetting how to cook, recipes, etc. She lives at home, but her son, Erica Carroll, has moved in with her and helps with housework, cooking, etc and checks on her throughout the  day. Her daughter drives her to Dr's appointments.  She has decreased hearing. Needs checked today.   She had a mechanical fall in 02/21/2019 that resulted in a pelvic fracture. she walks with a  walker.  She denies incontinence.   .   She follows with a. Fib clinic for coumadin checks. She is on 5 mg every day but 1/2 tablet 3 days a week. She is over due for testing, will get today since her daughter Erica Carroll takes her. Patient's last INR is  Lab Results  Component Value Date   INR 1.5 (A) 02/29/2020   INR 2.9 01/11/2020   INR 2.5 10/24/2019   BMI is Body mass index is 25.23 kg/m., she is down 12 lbs from last year.  Wt Readings from Last 3 Encounters:  04/04/20 147 lb (66.7 kg)  07/11/19 150 lb 6.4 oz (68.2 kg)  02/28/19 159 lb (72.1 kg)    Her blood pressure has been controlled at home, today their BP is BP: 132/74 She does workout. She denies chest pain, shortness of breath, dizziness.   She is not on cholesterol medication and denies myalgias. Her cholesterol is at goal. The cholesterol last visit was:   Lab Results  Component Value Date   CHOL 145 03/28/2018   HDL 47 (L) 03/28/2018   LDLCALC 77 03/28/2018   TRIG 124 03/28/2018   CHOLHDL 3.1 03/28/2018    She has been working on diet and exercise for glucose management, and denies increased appetite, nausea, paresthesia of the feet, polydipsia, polyuria and visual disturbances. Last A1C in the office was:  Lab Results  Component Value Date   HGBA1C 5.4 03/28/2018   Last GFR: Lab Results  Component Value Date   GFRNONAA 38 (L) 02/22/2019   Patient is on Vitamin D supplement.   Lab Results  Component Value Date   VD25OH 43 10/02/2018      Medication Review:   Current Outpatient Medications (Cardiovascular):  .  metoprolol tartrate (LOPRESSOR) 25 MG tablet, Take 3 tablets (75 mg) by mouth twice daily.   Current Outpatient Medications (Analgesics):  .  acetaminophen (TYLENOL) 325 MG tablet, Take 325 mg by mouth every 6 (six) hours as needed for mild pain.   Current Outpatient Medications (Hematological):  .  warfarin (COUMADIN) 5 MG tablet, Take 1/2 a tablet to 1 tablet by mouth daily as directed by  the coumadin clinic  Current Outpatient Medications (Other):  .  b complex vitamins tablet, Take 1 tablet by mouth daily. .  Cholecalciferol (VITAMIN D PO), Take 3,000 Units by mouth daily.  Marland Kitchen  GLUCOSAMINE-CHONDROITIN PO, Take 1 tablet by mouth daily. Marland Kitchen  MAGNESIUM OXIDE, ANTACID, PO, Take 1 tablet by mouth daily. Marland Kitchen  OVER THE COUNTER MEDICATION, Take 1 tablet by mouth daily. Preserve vision vitamin   Allergies  Allergen Reactions  . Other Other (See Comments)    News Print: (freshly printed newspapers) cause her eyes to water . Allergy type reaction. Medal: itching & rash    Current Problems (verified) Patient Active Problem List   Diagnosis Date Noted  . Acquired thrombophilia ([place]) 03/05/2020  . Anemia 03/08/2019  . Mild dementia ([place]) 10/02/2018  . Environmental allergies   . Tachy-brady syndrome ([place]) 02/12/2015  . CKD stage G3b/A1, GFR 30-44 and albumin creatinine ratio <30 mg/g 01/28/2015  . Essential hypertension 01/03/2014  . Other abnormal glucose 01/03/2014  . Medication management 10/31/2013  . Hyperlipidemia   . Vitamin D deficiency   . Long term current  use of anticoagulant therapy 07/16/2013  . Sinus bradycardia 07/11/2013  . Paroxysmal atrial fibrillation ([place]) 07/10/2013  . GERD (gastroesophageal reflux disease) 07/10/2013  . History of TIA (transient ischemic attack) 06/25/2013    Screening Tests Immunization History  Administered Date(s) Administered  . Fluad Quad(high Dose 65+) 07/12/2019  . Influenza, High Dose Seasonal PF 07/18/2014, 10/02/2018  . Influenza,inj,Quad PF,6+ Mos 07/26/2013, 07/07/2016  . Influenza-Unspecified 05/28/2012  . PFIZER SARS-COV-2 Vaccination 10/17/2019, 11/07/2019  . Pneumococcal Conjugate-13 03/02/2017  . Pneumococcal-Unspecified 11/25/2008  . Td 09/13/2006  . Tdap 12/13/2015   Health Maintenance  Topic Date Due  . DEXA SCAN  Never done  . INFLUENZA VACCINE  04/27/2020  . TETANUS/TDAP  12/12/2025  . COVID-19 Vaccine   Completed  . PNA vac Low Risk Adult  Completed    Preventative care: Last colonoscopy: patient declined persistently, now deferred due to age Last mammogram: 03/2017 declines due to age Last pap smear/pelvic exam: remote   DEXA: never - declines due to age  Names of Other Physician/Practitioners you currently use: 1. [facility] here for primary care 2. Dr. Katy Fitch, eye doctor, last visit 2017?  3. Dr. ? , dentist, last visit 2018  Patient Care Team: Unk Pinto, MD as PCP - General (Internal Medicine) Stanford Breed Denice Bors, MD as Consulting Physician (Cardiology) Clent Jacks, MD as Consulting Physician (Ophthalmology)  SURGICAL HISTORY She  has a past surgical history that includes Eye surgery (Right, 1994); Eye surgery (Left, 1998); Appendectomy (1936); and Cardiac catheterization (N/A, 02/12/2015). FAMILY HISTORY Her family history includes CVA in her sister; Heart disease in her brother, brother, mother, and sister; Thyroid disease in her sister. SOCIAL HISTORY She  reports that she has never smoked. She has never used smokeless tobacco. She reports current alcohol use. She reports that she does not use drugs.   MEDICARE WELLNESS OBJECTIVES: Physical activity:   Cardiac risk factors:   Depression/mood screen:   Depression screen [facility] 2/9 10/02/2018  Decreased Interest 0  Down, Depressed, Hopeless 0  PHQ - 2 Score 0    ADLs:  No flowsheet data found.   Cognitive Testing  Alert? Yes  Normal Appearance?Yes  Oriented to person? Yes  Place? Yes   Time? no  Recall of three objects?  Yes 1/3   EOL planning: Does Patient Have a Medical Advance Directive?: Yes Type of Advance Directive: Living will  Review of Systems  Constitutional: Positive for malaise/fatigue. Negative for weight loss.  HENT: Negative for hearing loss and tinnitus.   Eyes: Negative for blurred vision and double vision.  Respiratory: Negative for cough, sputum  production, shortness of breath and wheezing.   Cardiovascular: Negative for chest pain, palpitations, orthopnea, claudication, leg swelling and PND.  Gastrointestinal: Negative for abdominal pain, blood in stool, constipation, diarrhea, heartburn, melena, nausea and vomiting.  Genitourinary: Negative.   Musculoskeletal: Negative for falls, joint pain and myalgias.  Skin: Negative for rash.  Neurological: Negative for dizziness, tingling, sensory change, weakness and headaches.  Endo/Heme/Allergies: Negative for polydipsia.  Psychiatric/Behavioral: Positive for memory loss (forgetting recipes, how to cook, meds without help). Negative for depression, substance abuse and suicidal ideas. The patient is not nervous/anxious and does not have insomnia.   All other systems reviewed and are negative.    Objective:     Today's Vitals   04/04/20 1108  BP: 132/74  Pulse: 66  Temp: 97.7 F (36.5 C)  SpO2: 95%  Weight: 147 lb (66.7 kg)  Height: 5\' 4"  (  1.626 m)  PainSc: 0-No pain   Body mass index is 25.23 kg/m.  General appearance: alert, no distress, WD/WN, female HEENT: normocephalic, sclerae anicteric, TMs pearly, nares patent, no discharge or erythema, pharynx normal, very HOH with bilateral hearing aids Oral cavity: MMM, no lesions Neck: supple, no lymphadenopathy, no thyromegaly, no masses Heart: RRR, normal S1, S2, no murmurs Lungs: CTA bilaterally, no wheezes, rhonchi, or rales Abdomen: +bs, soft, non tender, non distended, no masses, no hepatomegaly, no splenomegaly Musculoskeletal: nontender, no swelling, no obvious deformity Extremities: no edema, no cyanosis, no clubbing Pulses: 2+ symmetric, upper and lower extremities, normal cap refill Neurological: alert, oriented x 3, CN2-12 intact, strength normal upper extremities and lower extremities, sensation normal throughout, DTRs 2+ throughout, no cerebellar signs, gait slow but steady with walker Psychiatric: normal affect,  behavior normal, pleasant   Medicare Attestation I have personally reviewed: The patient's medical and social history Their use of alcohol, tobacco or illicit drugs Their current medications and supplements The patient's functional ability including ADLs,fall risks, home safety risks, cognitive, and hearing and visual impairment Diet and physical activities Evidence for depression or mood disorders  The patient's weight, height, BMI, and visual acuity have been recorded in the chart.  I have made referrals, counseling, and provided education to the patient based on review of the above and I have provided the patient with a written personalized care plan for preventive services.     Vicie Mutters, PA-C   04/04/2020

## 2020-04-04 ENCOUNTER — Ambulatory Visit (INDEPENDENT_AMBULATORY_CARE_PROVIDER_SITE_OTHER): Payer: Medicare HMO | Admitting: Physician Assistant

## 2020-04-04 ENCOUNTER — Other Ambulatory Visit: Payer: Self-pay

## 2020-04-04 ENCOUNTER — Encounter: Payer: Self-pay | Admitting: Physician Assistant

## 2020-04-04 VITALS — BP 132/74 | HR 66 | Temp 97.7°F | Ht 64.0 in | Wt 147.0 lb

## 2020-04-04 DIAGNOSIS — Z8673 Personal history of transient ischemic attack (TIA), and cerebral infarction without residual deficits: Secondary | ICD-10-CM

## 2020-04-04 DIAGNOSIS — F03A0 Unspecified dementia, mild, without behavioral disturbance, psychotic disturbance, mood disturbance, and anxiety: Secondary | ICD-10-CM

## 2020-04-04 DIAGNOSIS — I495 Sick sinus syndrome: Secondary | ICD-10-CM

## 2020-04-04 DIAGNOSIS — H6123 Impacted cerumen, bilateral: Secondary | ICD-10-CM

## 2020-04-04 DIAGNOSIS — H9203 Otalgia, bilateral: Secondary | ICD-10-CM

## 2020-04-04 DIAGNOSIS — Z0001 Encounter for general adult medical examination with abnormal findings: Secondary | ICD-10-CM

## 2020-04-04 DIAGNOSIS — R6889 Other general symptoms and signs: Secondary | ICD-10-CM

## 2020-04-04 DIAGNOSIS — D6869 Other thrombophilia: Secondary | ICD-10-CM

## 2020-04-04 DIAGNOSIS — R69 Illness, unspecified: Secondary | ICD-10-CM | POA: Diagnosis not present

## 2020-04-04 DIAGNOSIS — E559 Vitamin D deficiency, unspecified: Secondary | ICD-10-CM | POA: Diagnosis not present

## 2020-04-04 DIAGNOSIS — R7309 Other abnormal glucose: Secondary | ICD-10-CM

## 2020-04-04 DIAGNOSIS — Z7901 Long term (current) use of anticoagulants: Secondary | ICD-10-CM | POA: Diagnosis not present

## 2020-04-04 DIAGNOSIS — I10 Essential (primary) hypertension: Secondary | ICD-10-CM

## 2020-04-04 DIAGNOSIS — F0390 Unspecified dementia without behavioral disturbance: Secondary | ICD-10-CM | POA: Diagnosis not present

## 2020-04-04 DIAGNOSIS — Z79899 Other long term (current) drug therapy: Secondary | ICD-10-CM | POA: Diagnosis not present

## 2020-04-04 DIAGNOSIS — E785 Hyperlipidemia, unspecified: Secondary | ICD-10-CM | POA: Diagnosis not present

## 2020-04-04 DIAGNOSIS — Z9109 Other allergy status, other than to drugs and biological substances: Secondary | ICD-10-CM

## 2020-04-04 DIAGNOSIS — I480 Paroxysmal atrial fibrillation: Secondary | ICD-10-CM

## 2020-04-04 DIAGNOSIS — R001 Bradycardia, unspecified: Secondary | ICD-10-CM

## 2020-04-04 DIAGNOSIS — D649 Anemia, unspecified: Secondary | ICD-10-CM

## 2020-04-04 DIAGNOSIS — N1832 Chronic kidney disease, stage 3b: Secondary | ICD-10-CM

## 2020-04-04 DIAGNOSIS — K219 Gastro-esophageal reflux disease without esophagitis: Secondary | ICD-10-CM

## 2020-04-04 NOTE — Patient Instructions (Addendum)
Use a dropper or use a cap to put peroxide, olive oil,mineral oil or canola oil in the effected ear- 2-3 times a week. Let it soak for 20-30 min then you can take a shower or use a baby bulb with warm water to wash out the ear wax.  Can buy debrox wax removal kit over the counter.  Do not use Qtips   Ask insurance and pharmacy about shingrix - it is a 2 part shot that we will not be getting in the office.   Suggest getting AFTER covid vaccines, have to wait at least a month This shot can make you feel bad due to such good immune response it can trigger some inflammation so take tylenol or aleve day of or day after and plan on resting.   Can go to [email or web address] for more information  Shingrix Vaccination  Two vaccines are licensed and recommended to prevent shingles in the [place] Zoster vaccine live (ZVL, Zostavax) has been in use since 2006. Recombinant zoster vaccine (RZV, Shingrix), has been in use since 2017 and is recommended by ACIP as the preferred shingles vaccine.  What Everyone Should Know about Shingles Vaccine (Shingrix) One of the Recommended Vaccines by Disease Shingles vaccination is the only way to protect against shingles and postherpetic neuralgia (PHN), the most common complication from shingles. CDC recommends that healthy adults 50 years and older get two doses of the shingles vaccine called Shingrix (recombinant zoster vaccine), separated by 2 to 6 months, to prevent shingles and the complications from the disease. Your doctor or pharmacist can give you Shingrix as a shot in your upper arm. Shingrix provides strong protection against shingles and PHN. Two doses of Shingrix is more than 90% effective at preventing shingles and PHN. Protection stays above 85% for at least the first four years after you get vaccinated. Shingrix is the preferred vaccine, over Zostavax (zoster vaccine live), a shingles vaccine in use since 2006.  Zostavax may still be used to prevent shingles in healthy adults 60 years and older. For example, you could use Zostavax if a person is allergic to Shingrix, prefers Zostavax, or requests immediate vaccination and Shingrix is unavailable. Who Should Get Shingrix? Healthy adults 50 years and older should get two doses of Shingrix, separated by 2 to 6 months. You should get Shingrix even if in the past you . had shingles  . received Zostavax  . are not sure if you had chickenpox There is no maximum age for getting Shingrix. If you had shingles in the past, you can get Shingrix to help prevent future occurrences of the disease. There is no specific length of time that you need to wait after having shingles before you can receive Shingrix, but generally you should make sure the shingles rash has gone away before getting vaccinated. You can get Shingrix whether or not you remember having had chickenpox in the past. Studies show that more than 99% of Americans 40 years and older have had chickenpox, even if they don't remember having the disease. Chickenpox and shingles are related because they are caused by the same virus (varicella zoster virus). After a person recovers from chickenpox, the virus stays dormant (inactive) in the body. It can reactivate years later and cause shingles. If you had Zostavax in the recent past, you should wait at least eight weeks before getting Shingrix. Talk to your healthcare provider to determine the best time to get Shingrix. Shingrix is available in [employer] and pharmacies. To find doctor's  offices or pharmacies near you that offer the vaccine, visit HealthMap Vaccine FinderExternal. If you have questions about Shingrix, talk with your healthcare provider. Vaccine for Those 48 Years and Older  Shingrix reduces the risk of shingles and PHN by more than 90% in people 76 and older. CDC recommends the vaccine for healthy adults 54 and older.  Who Should Not Get  Shingrix? You should not get Shingrix if you: . have ever had a severe allergic reaction to any component of the vaccine or after a dose of Shingrix  . tested negative for immunity to varicella zoster virus. If you test negative, you should get chickenpox vaccine.  . currently have shingles  . currently are pregnant or breastfeeding. Women who are pregnant or breastfeeding should wait to get Shingrix.  Marland Kitchen receive specific antiviral drugs (acyclovir, famciclovir, or valacyclovir) 24 hours before vaccination (avoid use of these antiviral drugs for 14 days after vaccination)- zoster vaccine live only If you have a minor acute (starts suddenly) illness, such as a cold, you may get Shingrix. But if you have a moderate or severe acute illness, you should usually wait until you recover before getting the vaccine. This includes anyone with a temperature of 101.69F or higher. The side effects of the Shingrix are temporary, and usually last 2 to 3 days. While you may experience pain for a few days after getting Shingrix, the pain will be less severe than having shingles and the complications from the disease. How Well Does Shingrix Work? Two doses of Shingrix provides strong protection against shingles and postherpetic neuralgia (PHN), the most common complication of shingles. . In adults 38 to 84 years old who got two doses, Shingrix was 97% effective in preventing shingles; among adults 70 years and older, Shingrix was 91% effective.  . In adults 44 to 84 years old who got two doses, Shingrix was 91% effective in preventing PHN; among adults 70 years and older, Shingrix was 89% effective. Shingrix protection remained high (more than 85%) in people 70 years and older throughout the four years following vaccination. Since your risk of shingles and PHN increases as you get older, it is important to have strong protection against shingles in your older years. Top of Page  What Are the Possible Side Effects of  Shingrix? Studies show that Shingrix is safe. The vaccine helps your body create a strong defense against shingles. As a result, you are likely to have temporary side effects from getting the shots. The side effects may affect your ability to do normal daily activities for 2 to 3 days. Most people got a sore arm with mild or moderate pain after getting Shingrix, and some also had redness and swelling where they got the shot. Some people felt tired, had muscle pain, a headache, shivering, fever, stomach pain, or nausea. About 1 out of 6 people who got Shingrix experienced side effects that prevented them from doing regular activities. Symptoms went away on their own in about 2 to 3 days. Side effects were more common in younger people. You might have a reaction to the first or second dose of Shingrix, or both doses. If you experience side effects, you may choose to take over-the-counter pain medicine such as ibuprofen or acetaminophen. If you experience side effects from Shingrix, you should report them to the Vaccine Adverse Event Reporting System (VAERS). Your doctor might file this report, or you can do it yourself through the VAERS websiteExternal, or by calling [phone]. If you have any  questions about side effects from Shingrix, talk with your doctor. The shingles vaccine does not contain thimerosal (a preservative containing mercury). Top of Page  When Should I See a Doctor Because of the Side Effects I Experience From Shingrix? In clinical trials, Shingrix was not associated with serious adverse events. In fact, serious side effects from vaccines are extremely rare. For example, for every 1 million doses of a vaccine given, only one or two people may have a severe allergic reaction. Signs of an allergic reaction happen within minutes or hours after vaccination and include hives, swelling of the face and throat, difficulty breathing, a fast heartbeat, dizziness, or weakness. If you experience  these or any other life-threatening symptoms, see a doctor right away. Shingrix causes a strong response in your immune system, so it may produce short-term side effects more intense than you are used to from other vaccines. These side effects can be uncomfortable, but they are expected and usually go away on their own in 2 or 3 days. Top of Page  How Can I Pay For Shingrix? There are several ways shingles vaccine may be paid for: Medicare . Medicare Part D plans cover the shingles vaccine, but there may be a cost to you depending on your plan. There may be a copay for the vaccine, or you may need to pay in full then get reimbursed for a certain amount.  . Medicare Part B does not cover the shingles vaccine. Medicaid . Medicaid may or may not cover the vaccine. Contact your insurer to find out. Private health insurance . Many private health insurance plans will cover the vaccine, but there may be a cost to you depending on your plan. Contact your insurer to find out. Vaccine assistance programs . Some pharmaceutical companies provide vaccines to eligible adults who cannot afford them. You may want to check with the vaccine manufacturer, [employer], about Shingrix. If you do not currently have health insurance, learn more about affordable health coverage optionsExternal. To find doctor's offices or pharmacies near you that offer the vaccine, visit HealthMap Vaccine FinderExternal.

## 2020-04-05 LAB — COMPLETE METABOLIC PANEL WITH GFR
AG Ratio: 1.5 (calc) (ref 1.0–2.5)
ALT: 9 U/L (ref 6–29)
AST: 17 U/L (ref 10–35)
Albumin: 4.3 g/dL (ref 3.6–5.1)
Alkaline phosphatase (APISO): 52 U/L (ref 37–153)
BUN/Creatinine Ratio: 13 (calc) (ref 6–22)
BUN: 20 mg/dL (ref 7–25)
CO2: 28 mmol/L (ref 20–32)
Calcium: 9.4 mg/dL (ref 8.6–10.4)
Chloride: 105 mmol/L (ref 98–110)
Creat: 1.53 mg/dL — ABNORMAL HIGH (ref 0.60–0.88)
GFR, Est African American: 33 mL/min/{1.73_m2} — ABNORMAL LOW (ref >=60)
GFR, Est Non African American: 29 mL/min/{1.73_m2} — ABNORMAL LOW (ref >=60)
Globulin: 2.9 g/dL (calc) (ref 1.9–3.7)
Glucose, Bld: 81 mg/dL (ref 65–99)
Potassium: 4.4 mmol/L (ref 3.5–5.3)
Sodium: 138 mmol/L (ref 135–146)
Total Bilirubin: 0.6 mg/dL (ref 0.2–1.2)
Total Protein: 7.2 g/dL (ref 6.1–8.1)

## 2020-04-05 LAB — CBC WITH DIFFERENTIAL/PLATELET
Absolute Monocytes: 730 cells/uL (ref 200–950)
Basophils Absolute: 41 cells/uL (ref 0–200)
Basophils Relative: 0.5 %
Eosinophils Absolute: 353 cells/uL (ref 15–500)
Eosinophils Relative: 4.3 %
HCT: 36.5 % (ref 35.0–45.0)
Hemoglobin: 12.3 g/dL (ref 11.7–15.5)
Lymphs Abs: 1583 cells/uL (ref 850–3900)
MCH: 31.2 pg (ref 27.0–33.0)
MCHC: 33.7 g/dL (ref 32.0–36.0)
MCV: 92.6 fL (ref 80.0–100.0)
MPV: 10.3 fL (ref 7.5–12.5)
Monocytes Relative: 8.9 %
Neutro Abs: 5494 cells/uL (ref 1500–7800)
Neutrophils Relative %: 67 %
Platelets: 169 10*3/uL (ref 140–400)
RBC: 3.94 10*6/uL (ref 3.80–5.10)
RDW: 12.5 % (ref 11.0–15.0)
Total Lymphocyte: 19.3 %
WBC: 8.2 10*3/uL (ref 3.8–10.8)

## 2020-04-05 LAB — MAGNESIUM: Magnesium: 2.3 mg/dL (ref 1.5–2.5)

## 2020-04-05 LAB — PROTIME-INR
INR: 1.8 — ABNORMAL HIGH
Prothrombin Time: 18.9 s — ABNORMAL HIGH (ref 9.0–11.5)

## 2020-04-07 ENCOUNTER — Ambulatory Visit: Payer: Medicare HMO | Admitting: Physician Assistant

## 2020-04-15 ENCOUNTER — Ambulatory Visit (INDEPENDENT_AMBULATORY_CARE_PROVIDER_SITE_OTHER): Payer: Medicare HMO | Admitting: *Deleted

## 2020-04-15 DIAGNOSIS — I495 Sick sinus syndrome: Secondary | ICD-10-CM

## 2020-04-15 LAB — CUP PACEART REMOTE DEVICE CHECK
Battery Remaining Longevity: 130 mo
Battery Remaining Percentage: 95.5 %
Battery Voltage: 2.98 V
Brady Statistic AP VP Percent: 7.2 %
Brady Statistic AP VS Percent: 84 %
Brady Statistic AS VP Percent: 1 %
Brady Statistic AS VS Percent: 7.5 %
Brady Statistic RA Percent Paced: 83 %
Brady Statistic RV Percent Paced: 6.9 %
Date Time Interrogation Session: 20210720020016
Implantable Lead Implant Date: 20160518
Implantable Lead Implant Date: 20160518
Implantable Lead Location: 753859
Implantable Lead Location: 753860
Implantable Lead Model: 1944
Implantable Lead Model: 1948
Implantable Pulse Generator Implant Date: 20160518
Lead Channel Impedance Value: 610 Ohm
Lead Channel Impedance Value: 640 Ohm
Lead Channel Pacing Threshold Amplitude: 0.5 V
Lead Channel Pacing Threshold Amplitude: 0.625 V
Lead Channel Pacing Threshold Pulse Width: 0.4 ms
Lead Channel Pacing Threshold Pulse Width: 0.4 ms
Lead Channel Sensing Intrinsic Amplitude: 0.4 mV
Lead Channel Sensing Intrinsic Amplitude: 9.3 mV
Lead Channel Setting Pacing Amplitude: 0.875
Lead Channel Setting Pacing Amplitude: 2 V
Lead Channel Setting Pacing Pulse Width: 0.4 ms
Lead Channel Setting Sensing Sensitivity: 2 mV
Pulse Gen Model: 2240
Pulse Gen Serial Number: 7759914

## 2020-04-17 NOTE — Progress Notes (Signed)
Remote pacemaker transmission.   

## 2020-05-01 ENCOUNTER — Telehealth: Payer: Self-pay | Admitting: Pharmacist

## 2020-05-01 NOTE — Telephone Encounter (Signed)
Left message for pt's daughter since she is overdue for INR test.

## 2020-05-12 ENCOUNTER — Other Ambulatory Visit: Payer: Self-pay | Admitting: Cardiology

## 2020-05-12 DIAGNOSIS — I480 Paroxysmal atrial fibrillation: Secondary | ICD-10-CM

## 2020-05-13 ENCOUNTER — Ambulatory Visit (INDEPENDENT_AMBULATORY_CARE_PROVIDER_SITE_OTHER): Payer: Medicare HMO | Admitting: *Deleted

## 2020-05-13 ENCOUNTER — Other Ambulatory Visit: Payer: Self-pay

## 2020-05-13 DIAGNOSIS — Z8673 Personal history of transient ischemic attack (TIA), and cerebral infarction without residual deficits: Secondary | ICD-10-CM

## 2020-05-13 DIAGNOSIS — Z79899 Other long term (current) drug therapy: Secondary | ICD-10-CM | POA: Diagnosis not present

## 2020-05-13 DIAGNOSIS — I480 Paroxysmal atrial fibrillation: Secondary | ICD-10-CM

## 2020-05-13 LAB — POCT INR: INR: 1.9 — AB (ref 2.0–3.0)

## 2020-05-13 NOTE — Patient Instructions (Signed)
Description   Today take 5mg  then start taking 5mg  daily except 2.5mg  on Tuesdays and Saturdays. Recheck INR in 3 weeks. Call if placed on any new medications [phone]. Keep intake of greens consistent.

## 2020-06-03 ENCOUNTER — Other Ambulatory Visit: Payer: Self-pay

## 2020-06-03 ENCOUNTER — Ambulatory Visit (INDEPENDENT_AMBULATORY_CARE_PROVIDER_SITE_OTHER): Payer: Medicare HMO | Admitting: *Deleted

## 2020-06-03 DIAGNOSIS — Z8673 Personal history of transient ischemic attack (TIA), and cerebral infarction without residual deficits: Secondary | ICD-10-CM

## 2020-06-03 DIAGNOSIS — Z79899 Other long term (current) drug therapy: Secondary | ICD-10-CM

## 2020-06-03 DIAGNOSIS — Z5181 Encounter for therapeutic drug level monitoring: Secondary | ICD-10-CM | POA: Diagnosis not present

## 2020-06-03 DIAGNOSIS — I480 Paroxysmal atrial fibrillation: Secondary | ICD-10-CM

## 2020-06-03 LAB — POCT INR: INR: 2.6 (ref 2.0–3.0)

## 2020-06-03 NOTE — Patient Instructions (Signed)
Description   Continue taking Warfarin 5mg daily except 2.5mg on Tuesdays and Saturdays. Recheck INR in 4 weeks. Call if placed on any new medications #[phone]. Keep intake of greens consistent.    

## 2020-06-21 ENCOUNTER — Other Ambulatory Visit: Payer: Self-pay | Admitting: Internal Medicine

## 2020-07-05 DIAGNOSIS — R69 Illness, unspecified: Secondary | ICD-10-CM | POA: Diagnosis not present

## 2020-07-10 ENCOUNTER — Ambulatory Visit (INDEPENDENT_AMBULATORY_CARE_PROVIDER_SITE_OTHER): Payer: Medicare HMO | Admitting: *Deleted

## 2020-07-10 ENCOUNTER — Other Ambulatory Visit: Payer: Self-pay

## 2020-07-10 DIAGNOSIS — Z79899 Other long term (current) drug therapy: Secondary | ICD-10-CM

## 2020-07-10 DIAGNOSIS — Z8673 Personal history of transient ischemic attack (TIA), and cerebral infarction without residual deficits: Secondary | ICD-10-CM | POA: Diagnosis not present

## 2020-07-10 DIAGNOSIS — I480 Paroxysmal atrial fibrillation: Secondary | ICD-10-CM

## 2020-07-10 LAB — POCT INR: INR: 2.6 (ref 2.0–3.0)

## 2020-07-10 NOTE — Patient Instructions (Signed)
Description   Continue taking Warfarin 5mg daily except 2.5mg on Tuesdays and Saturdays. Recheck INR in 5 weeks. Call if placed on any new medications #[phone]. Keep intake of greens consistent.    

## 2020-07-15 ENCOUNTER — Ambulatory Visit (INDEPENDENT_AMBULATORY_CARE_PROVIDER_SITE_OTHER): Payer: Medicare HMO

## 2020-07-15 DIAGNOSIS — I495 Sick sinus syndrome: Secondary | ICD-10-CM

## 2020-07-17 LAB — CUP PACEART REMOTE DEVICE CHECK
Battery Remaining Longevity: 129 mo
Battery Remaining Percentage: 95.5 %
Battery Voltage: 2.99 V
Brady Statistic AP VP Percent: 5.8 %
Brady Statistic AP VS Percent: 87 %
Brady Statistic AS VP Percent: 1 %
Brady Statistic AS VS Percent: 6.5 %
Brady Statistic RA Percent Paced: 86 %
Brady Statistic RV Percent Paced: 5.7 %
Date Time Interrogation Session: 20211020183937
Implantable Lead Implant Date: 20160518
Implantable Lead Implant Date: 20160518
Implantable Lead Location: 753859
Implantable Lead Location: 753860
Implantable Lead Model: 1944
Implantable Lead Model: 1948
Implantable Pulse Generator Implant Date: 20160518
Lead Channel Impedance Value: 580 Ohm
Lead Channel Impedance Value: 640 Ohm
Lead Channel Pacing Threshold Amplitude: 0.5 V
Lead Channel Pacing Threshold Amplitude: 0.625 V
Lead Channel Pacing Threshold Pulse Width: 0.4 ms
Lead Channel Pacing Threshold Pulse Width: 0.4 ms
Lead Channel Sensing Intrinsic Amplitude: 0.5 mV
Lead Channel Sensing Intrinsic Amplitude: 9.5 mV
Lead Channel Setting Pacing Amplitude: 0.875
Lead Channel Setting Pacing Amplitude: 2 V
Lead Channel Setting Pacing Pulse Width: 0.4 ms
Lead Channel Setting Sensing Sensitivity: 2 mV
Pulse Gen Model: 2240
Pulse Gen Serial Number: 7759914

## 2020-07-21 NOTE — Progress Notes (Signed)
Remote pacemaker transmission.   

## 2020-07-22 ENCOUNTER — Other Ambulatory Visit: Payer: Self-pay | Admitting: Internal Medicine

## 2020-07-23 MED ORDER — METOPROLOL TARTRATE 25 MG PO TABS: ORAL_TABLET | ORAL | 0 refills | Status: DC

## 2020-08-05 ENCOUNTER — Other Ambulatory Visit: Payer: Self-pay | Admitting: Cardiology

## 2020-08-05 DIAGNOSIS — I480 Paroxysmal atrial fibrillation: Secondary | ICD-10-CM

## 2020-08-06 ENCOUNTER — Other Ambulatory Visit: Payer: Self-pay | Admitting: Internal Medicine

## 2020-08-08 ENCOUNTER — Other Ambulatory Visit: Payer: Self-pay | Admitting: Internal Medicine

## 2020-08-12 ENCOUNTER — Telehealth: Payer: Self-pay | Admitting: Internal Medicine

## 2020-08-12 ENCOUNTER — Other Ambulatory Visit: Payer: Self-pay | Admitting: Internal Medicine

## 2020-08-12 MED ORDER — METOPROLOL TARTRATE 25 MG PO TABS: ORAL_TABLET | ORAL | 3 refills | Status: DC

## 2020-08-12 NOTE — Telephone Encounter (Signed)
Pt c/o medication issue:  1. Name of Medication: metoprolol tartrate (LOPRESSOR) 25 MG tablet  2. How are you currently taking this medication (dosage and times per day)? as directed  3. Are you having a reaction (difficulty breathing--STAT)? no  4. What is your medication issue? Patient gets a 90 day supply of this medication. But, she is taking 75mg  2x daily which equals 3 tablets in the AM and 3 in PM so she is running out of the 90 day supply in less than a month. She wants to know if there is a way to fix this so that her mother gets the full 90 days. She will be out tomorrow. Please advise. Pharmacy is [id number] - [place], [place]

## 2020-08-12 NOTE — Telephone Encounter (Signed)
Called pt's daughter to inform her that pt is overdue for an appt with Dr. [name] and too soon to send in another refill until pt makes overdue appt. Daughter stated that she would make an appt. I informed daughter that as soon as the pt sets up an appt with Dr. [name] that we could send in a Rx for enough medication until the appointment. Daughter verbalized understanding.

## 2020-08-12 NOTE — Telephone Encounter (Signed)
Pt's medication has already been sent to pt's pharmacy as requested. Confirmation received.  

## 2020-08-12 NOTE — Telephone Encounter (Signed)
  STAT If patient is at the pharmacy, call can be transferred to refill team.   1. Which medications need to be refilled? (please list name of each medication and dose if known)  metoprolol tartrate (LOPRESSOR) 25 MG tablet  2. Which pharmacy/location (including street and city if local pharmacy) is medication to be sent to? [id number] - [place], [place] - [address] AT [address]  3. Do they need a 30 day or 90 day supply? 90 with refills   Pt is out of medication  Pt is scheduled to see Dr. [name] in March 2022. Pt did not want to see a PA or do a telemedicine visit. Pt is on the wait list for cancellations

## 2020-08-12 NOTE — Telephone Encounter (Signed)
Could you please finish notes, I am unable to close this encounter. thanks

## 2020-08-14 ENCOUNTER — Ambulatory Visit (INDEPENDENT_AMBULATORY_CARE_PROVIDER_SITE_OTHER): Payer: Medicare HMO | Admitting: *Deleted

## 2020-08-14 ENCOUNTER — Other Ambulatory Visit: Payer: Self-pay

## 2020-08-14 DIAGNOSIS — Z8673 Personal history of transient ischemic attack (TIA), and cerebral infarction without residual deficits: Secondary | ICD-10-CM | POA: Diagnosis not present

## 2020-08-14 DIAGNOSIS — I480 Paroxysmal atrial fibrillation: Secondary | ICD-10-CM

## 2020-08-14 DIAGNOSIS — Z5181 Encounter for therapeutic drug level monitoring: Secondary | ICD-10-CM

## 2020-08-14 DIAGNOSIS — Z79899 Other long term (current) drug therapy: Secondary | ICD-10-CM | POA: Diagnosis not present

## 2020-08-14 LAB — POCT INR: INR: 4.3 — AB (ref 2.0–3.0)

## 2020-08-14 NOTE — Patient Instructions (Signed)
Description   Hold warfarin today and tomorrow, then continue taking Warfarin 5mg  daily except 2.5mg  on Tuesdays and Saturdays. Recheck INR in 2 weeks. Call if placed on any new medications [phone]. Keep intake of greens consistent.

## 2020-08-28 ENCOUNTER — Other Ambulatory Visit: Payer: Self-pay

## 2020-08-28 ENCOUNTER — Ambulatory Visit: Payer: Medicare HMO | Admitting: Pharmacist

## 2020-08-28 DIAGNOSIS — Z8673 Personal history of transient ischemic attack (TIA), and cerebral infarction without residual deficits: Secondary | ICD-10-CM

## 2020-08-28 DIAGNOSIS — I480 Paroxysmal atrial fibrillation: Secondary | ICD-10-CM

## 2020-08-28 DIAGNOSIS — Z79899 Other long term (current) drug therapy: Secondary | ICD-10-CM

## 2020-08-28 LAB — POCT INR: INR: 2.8 (ref 2.0–3.0)

## 2020-08-28 NOTE — Patient Instructions (Signed)
Description   Continue taking Warfarin 5mg  daily except 2.5mg  on Tuesdays and Saturdays. Recheck INR in 4 weeks. Call if placed on any new medications [phone]. Keep intake of greens consistent.

## 2020-09-11 ENCOUNTER — Other Ambulatory Visit: Payer: Self-pay | Admitting: Internal Medicine

## 2020-10-01 ENCOUNTER — Telehealth: Payer: Self-pay | Admitting: *Deleted

## 2020-10-01 NOTE — Telephone Encounter (Signed)
Left a message for the pt/dtr to call back to get appt rescheduled. Will await.

## 2020-10-08 ENCOUNTER — Ambulatory Visit: Payer: Medicare HMO | Admitting: Adult Health Nurse Practitioner

## 2020-10-14 ENCOUNTER — Ambulatory Visit (INDEPENDENT_AMBULATORY_CARE_PROVIDER_SITE_OTHER): Payer: Medicare HMO

## 2020-10-14 DIAGNOSIS — I495 Sick sinus syndrome: Secondary | ICD-10-CM | POA: Diagnosis not present

## 2020-10-16 LAB — CUP PACEART REMOTE DEVICE CHECK
Battery Remaining Longevity: 128 mo
Battery Remaining Percentage: 95.5 %
Battery Voltage: 2.98 V
Brady Statistic AP VP Percent: 5 %
Brady Statistic AP VS Percent: 88 %
Brady Statistic AS VP Percent: 1 %
Brady Statistic AS VS Percent: 6.3 %
Brady Statistic RA Percent Paced: 87 %
Brady Statistic RV Percent Paced: 4.9 %
Date Time Interrogation Session: 20220119015013
Implantable Lead Implant Date: 20160518
Implantable Lead Implant Date: 20160518
Implantable Lead Location: 753859
Implantable Lead Location: 753860
Implantable Lead Model: 1944
Implantable Lead Model: 1948
Implantable Pulse Generator Implant Date: 20160518
Lead Channel Impedance Value: 530 Ohm
Lead Channel Impedance Value: 630 Ohm
Lead Channel Pacing Threshold Amplitude: 0.5 V
Lead Channel Pacing Threshold Amplitude: 0.625 V
Lead Channel Pacing Threshold Pulse Width: 0.4 ms
Lead Channel Pacing Threshold Pulse Width: 0.4 ms
Lead Channel Sensing Intrinsic Amplitude: 0.5 mV
Lead Channel Sensing Intrinsic Amplitude: 9.3 mV
Lead Channel Setting Pacing Amplitude: 0.875
Lead Channel Setting Pacing Amplitude: 2 V
Lead Channel Setting Pacing Pulse Width: 0.4 ms
Lead Channel Setting Sensing Sensitivity: 2 mV
Pulse Gen Model: 2240
Pulse Gen Serial Number: 7759914

## 2020-10-23 ENCOUNTER — Telehealth: Payer: Self-pay | Admitting: *Deleted

## 2020-10-23 NOTE — Telephone Encounter (Signed)
Called since she is overdue for her INR appt; there was no answer but left a message on the voicemail. Will await a call back.

## 2020-10-28 ENCOUNTER — Telehealth: Payer: Self-pay | Admitting: *Deleted

## 2020-10-28 NOTE — Telephone Encounter (Signed)
Called and spoke pt' daughter, informed her that pt was over due for INR check. Pt's daughter stated that they had to cancel the last appointment due to weather but knew someone who had a home machine to check an INR and the pt's INR was 2.8. Informed daughter that pt would still need to come in to have her INR checked. Daughter stated that she would have to give the coumadin clinic a call back to schedule an appointment.

## 2020-10-28 NOTE — Progress Notes (Signed)
Remote pacemaker transmission.   

## 2020-10-29 ENCOUNTER — Ambulatory Visit: Payer: Medicare HMO | Admitting: Adult Health Nurse Practitioner

## 2020-11-02 ENCOUNTER — Other Ambulatory Visit: Payer: Self-pay | Admitting: Cardiology

## 2020-11-02 DIAGNOSIS — I480 Paroxysmal atrial fibrillation: Secondary | ICD-10-CM

## 2020-11-03 DIAGNOSIS — Z809 Family history of malignant neoplasm, unspecified: Secondary | ICD-10-CM | POA: Diagnosis not present

## 2020-11-03 DIAGNOSIS — D6869 Other thrombophilia: Secondary | ICD-10-CM | POA: Diagnosis not present

## 2020-11-03 DIAGNOSIS — I4891 Unspecified atrial fibrillation: Secondary | ICD-10-CM | POA: Diagnosis not present

## 2020-11-03 DIAGNOSIS — Z833 Family history of diabetes mellitus: Secondary | ICD-10-CM | POA: Diagnosis not present

## 2020-11-03 DIAGNOSIS — R32 Unspecified urinary incontinence: Secondary | ICD-10-CM | POA: Diagnosis not present

## 2020-11-03 DIAGNOSIS — Z7901 Long term (current) use of anticoagulants: Secondary | ICD-10-CM | POA: Diagnosis not present

## 2020-11-03 DIAGNOSIS — R69 Illness, unspecified: Secondary | ICD-10-CM | POA: Diagnosis not present

## 2020-11-03 DIAGNOSIS — J309 Allergic rhinitis, unspecified: Secondary | ICD-10-CM | POA: Diagnosis not present

## 2020-11-03 DIAGNOSIS — Z8249 Family history of ischemic heart disease and other diseases of the circulatory system: Secondary | ICD-10-CM | POA: Diagnosis not present

## 2020-11-03 DIAGNOSIS — Z823 Family history of stroke: Secondary | ICD-10-CM | POA: Diagnosis not present

## 2020-11-03 DIAGNOSIS — Z008 Encounter for other general examination: Secondary | ICD-10-CM | POA: Diagnosis not present

## 2020-11-06 ENCOUNTER — Telehealth: Payer: Self-pay | Admitting: *Deleted

## 2020-11-06 NOTE — Telephone Encounter (Signed)
Pt is overdue for INR check. Called the pt's daughter,[name], since she transports the pt to her appointments; had to leave a message for her to call back.

## 2020-11-21 ENCOUNTER — Ambulatory Visit (INDEPENDENT_AMBULATORY_CARE_PROVIDER_SITE_OTHER): Payer: Medicare HMO | Admitting: *Deleted

## 2020-11-21 ENCOUNTER — Other Ambulatory Visit: Payer: Self-pay

## 2020-11-21 DIAGNOSIS — I480 Paroxysmal atrial fibrillation: Secondary | ICD-10-CM | POA: Diagnosis not present

## 2020-11-21 LAB — POCT INR: INR: 3 (ref 2.0–3.0)

## 2020-11-21 NOTE — Patient Instructions (Signed)
Description   Continue taking Warfarin 5mg  daily except 2.5mg  on Tuesdays and Saturdays. Recheck INR in 5 weeks. Call if placed on any new medications [phone]. Keep intake of greens consistent.

## 2020-11-24 DIAGNOSIS — Z950 Presence of cardiac pacemaker: Secondary | ICD-10-CM | POA: Insufficient documentation

## 2020-11-26 ENCOUNTER — Ambulatory Visit (INDEPENDENT_AMBULATORY_CARE_PROVIDER_SITE_OTHER): Payer: Medicare HMO | Admitting: Internal Medicine

## 2020-11-26 ENCOUNTER — Encounter: Payer: Self-pay | Admitting: Internal Medicine

## 2020-11-26 ENCOUNTER — Other Ambulatory Visit: Payer: Self-pay

## 2020-11-26 VITALS — BP 124/66 | HR 60 | Ht 64.0 in | Wt 144.6 lb

## 2020-11-26 DIAGNOSIS — Z950 Presence of cardiac pacemaker: Secondary | ICD-10-CM | POA: Diagnosis not present

## 2020-11-26 DIAGNOSIS — R001 Bradycardia, unspecified: Secondary | ICD-10-CM

## 2020-11-26 DIAGNOSIS — I495 Sick sinus syndrome: Secondary | ICD-10-CM

## 2020-11-26 NOTE — Patient Instructions (Signed)
Medication Instructions:  Your physician recommends that you continue on your current medications as directed. Please refer to the Current Medication list given to you today.  *If you need a refill on your cardiac medications before your next appointment, please call your pharmacy*   Lab Work: None ordered.  If you have labs (blood work) drawn today and your tests are completely normal, you will receive your results only by: MyChart Message (if you have MyChart) OR A paper copy in the mail If you have any lab test that is abnormal or we need to change your treatment, we will call you to review the results.   Testing/Procedures: None ordered.    Follow-Up: At [facility], you and your health needs are our priority.  As part of our continuing mission to provide you with exceptional heart care, we have created designated Provider Care Teams.  These Care Teams include your primary Cardiologist (physician) and Advanced Practice Providers (APPs -  Physician Assistants and Nurse Practitioners) who all work together to provide you with the care you need, when you need it.  We recommend signing up for the patient portal called "MyChart".  Sign up information is provided on this After Visit Summary.  MyChart is used to connect with patients for Virtual Visits (Telemedicine).  Patients are able to view lab/test results, encounter notes, upcoming appointments, etc.  Non-urgent messages can be sent to your provider as well.   To learn more about what you can do with MyChart, go to [email or web address].    Your next appointment:   12 month(s)  The format for your next appointment:   In Person  Provider:   Dr Klein 

## 2020-11-26 NOTE — Progress Notes (Signed)
Patient Care Team: Unk Pinto, MD as PCP - General (Internal Medicine) Stanford Breed Denice Bors, MD as Consulting Physician (Cardiology) Clent Jacks, MD as Consulting Physician (Ophthalmology)   HPI  Erica Carroll is a 87 y.o. female Seen following a recent hospitalization for tachybradycardia syndrome. She underwent pacing. We initiated beta blockers to try to protect against tachyarrhythmia with limitations of up titration related to blood pressure.   She is increasingly struggling with dementia and has no complaints.  Her daughter has no particular observed issues.  No clinical bleeding on the warfarin.  No complaints of palpitations.     Date Cr K Hgb   6/18 1.3 4.3 12.3   5/20  1.21 4.3 11.1  7/21 1.53 4.4 52.8    Thromboembolic risk factors ( age -26, TIA/CVA-2, Gender-1) for a CHADSVASc Score of 5    Past Medical History:  Diagnosis Date  . A-fib ([place])    a. Dx 06/2013;  b. 06/2013 Echo:  EF 55-60%, no reg wma, mild AI, mod dil LA.  Marland Kitchen Allergy   . GERD (gastroesophageal reflux disease)   . Hyperlipidemia   . Pelvic fracture ([place]) 02/22/2019  . RA (rheumatoid arthritis) ([place])    " in my hands "  . Sinus bradycardia 07/11/2013  . TIA (transient ischemic attack)    a. with fall 05/2013 -> neg head CT and MRI/MRA;  b. Plavix started.  . Urinary incontinence   . Vitamin D deficiency     Past Surgical History:  Procedure Laterality Date  . APPENDECTOMY  1936  . EP IMPLANTABLE DEVICE N/A 02/12/2015   Procedure: Pacemaker Implant;  Surgeon: Deboraha Sprang, MD;  Location: [facility];  Service: Cardiovascular;  Laterality: N/A;  . EYE SURGERY Right 1994   CE/IOL  . EYE SURGERY Left 1998   CE/IOL    Current Outpatient Medications  Medication Sig Dispense Refill  . acetaminophen (TYLENOL) 325 MG tablet Take 325 mg by mouth every 6 (six) hours as needed for mild pain.     Marland Kitchen b complex vitamins tablet Take 1 tablet by mouth daily.    . Cholecalciferol  (VITAMIN D PO) Take 3,000 Units by mouth daily.     Marland Kitchen GLUCOSAMINE-CHONDROITIN PO Take 1 tablet by mouth daily.    Marland Kitchen MAGNESIUM OXIDE, ANTACID, PO Take 1 tablet by mouth daily.    . metoprolol tartrate (LOPRESSOR) 25 MG tablet Take 3 tablets (75 mg total) by mouth 2 (two) times daily. PLEASE BE SURE TO KEEP YOUR APPT WITH DR. [name] 11/2020 FOR FURTHER REFILLS 168 tablet 3  . OVER THE COUNTER MEDICATION Take 1 tablet by mouth daily. Preserve vision vitamin    . warfarin (COUMADIN) 5 MG tablet Take 1/2 a tablet to 1 tablet by mouth daily as directed by the coumadin clinic APPOINTMENT NEEDED FOR FURTHER REFILLS 40 tablet 0   No current facility-administered medications for this visit.    Allergies  Allergen Reactions  . Other Other (See Comments)    News Print: (freshly printed newspapers) cause her eyes to water . Allergy type reaction. Medal: itching & rash    Review of Systems negative except from HPI and PMH  Physical Exam BP 124/66   Pulse 60   Ht 5\' 4"  (1.626 m)   Wt 144 lb 9.6 oz (65.6 kg)   SpO2 94%   BMI 24.82 kg/m  Well developed and well nourished in no acute distress HENT normal Neck supple with JVP-flat  Clear Device pocket well healed; without hematoma or erythema.  There is no tethering  Regular rate and rhythm, no   murmur Abd-soft with active BS No Clubbing cyanosis   edema Skin-warm and dry A & Oriented  Grossly normal sensory and motor function  ECG atrial pacing at 60 Intervals 23/12/45 Right bundle branch left anterior fascicular block    Assessment and  Plan  Paroxysmal atrial fibrillation with a very rapid rate  Junctional tach  Sinus bradycardia  Hypertension  Pacemaker-[employer] Jude The patient's device was interrogated.  The information was reviewed. No changes were made in the programming.      Infrequent atrial fibrillation, moderate ventricular response.  We will continue her on her current medications.  Blood pressure well controlled.  No  clinical bleeding.  Discussed fall monitoring technology.

## 2020-12-04 ENCOUNTER — Other Ambulatory Visit: Payer: Self-pay | Admitting: Internal Medicine

## 2020-12-04 DIAGNOSIS — I480 Paroxysmal atrial fibrillation: Secondary | ICD-10-CM

## 2020-12-18 ENCOUNTER — Other Ambulatory Visit: Payer: Self-pay | Admitting: Internal Medicine

## 2020-12-18 DIAGNOSIS — I480 Paroxysmal atrial fibrillation: Secondary | ICD-10-CM

## 2020-12-18 NOTE — Telephone Encounter (Signed)
30 day warfarin supply sent. 90 day supply not appropriate as there has been noncompliance with appts while on monitored medication (warfarin). Please refer to phone messages from 10/01/20.

## 2020-12-26 ENCOUNTER — Ambulatory Visit (INDEPENDENT_AMBULATORY_CARE_PROVIDER_SITE_OTHER): Payer: Medicare HMO

## 2020-12-26 ENCOUNTER — Other Ambulatory Visit: Payer: Self-pay

## 2020-12-26 DIAGNOSIS — I480 Paroxysmal atrial fibrillation: Secondary | ICD-10-CM | POA: Diagnosis not present

## 2020-12-26 DIAGNOSIS — D649 Anemia, unspecified: Secondary | ICD-10-CM | POA: Diagnosis not present

## 2020-12-26 DIAGNOSIS — Z8673 Personal history of transient ischemic attack (TIA), and cerebral infarction without residual deficits: Secondary | ICD-10-CM | POA: Diagnosis not present

## 2020-12-26 DIAGNOSIS — D6869 Other thrombophilia: Secondary | ICD-10-CM | POA: Diagnosis not present

## 2020-12-26 DIAGNOSIS — Z79899 Other long term (current) drug therapy: Secondary | ICD-10-CM

## 2020-12-26 LAB — POCT INR: INR: 3.1 — AB (ref 2.0–3.0)

## 2020-12-26 NOTE — Patient Instructions (Signed)
-   have a serving of greens today or tomorrow - continue taking Warfarin 5mg  daily except 2.5mg  on Tuesdays and Saturdays.  - Recheck INR in 6 weeks.  Call if placed on any new medications [phone]. Keep intake of greens consistent.

## 2021-01-01 DIAGNOSIS — H6123 Impacted cerumen, bilateral: Secondary | ICD-10-CM | POA: Diagnosis not present

## 2021-01-02 ENCOUNTER — Other Ambulatory Visit: Payer: Self-pay | Admitting: Internal Medicine

## 2021-01-11 ENCOUNTER — Other Ambulatory Visit: Payer: Self-pay | Admitting: Internal Medicine

## 2021-01-11 DIAGNOSIS — I480 Paroxysmal atrial fibrillation: Secondary | ICD-10-CM

## 2021-01-13 ENCOUNTER — Ambulatory Visit (INDEPENDENT_AMBULATORY_CARE_PROVIDER_SITE_OTHER): Payer: Medicare HMO

## 2021-01-13 DIAGNOSIS — I495 Sick sinus syndrome: Secondary | ICD-10-CM | POA: Diagnosis not present

## 2021-01-14 LAB — CUP PACEART REMOTE DEVICE CHECK
Battery Remaining Longevity: 125 mo
Battery Remaining Percentage: 95.5 %
Battery Voltage: 2.98 V
Brady Statistic AP VP Percent: 1.7 %
Brady Statistic AP VS Percent: 93 %
Brady Statistic AS VP Percent: 1 %
Brady Statistic AS VS Percent: 4.8 %
Brady Statistic RA Percent Paced: 70 %
Brady Statistic RV Percent Paced: 1.4 %
Date Time Interrogation Session: 20220419231655
Implantable Lead Implant Date: 20160518
Implantable Lead Implant Date: 20160518
Implantable Lead Location: 753859
Implantable Lead Location: 753860
Implantable Lead Model: 1944
Implantable Lead Model: 1948
Implantable Pulse Generator Implant Date: 20160518
Lead Channel Impedance Value: 540 Ohm
Lead Channel Impedance Value: 600 Ohm
Lead Channel Pacing Threshold Amplitude: 0.5 V
Lead Channel Pacing Threshold Amplitude: 0.625 V
Lead Channel Pacing Threshold Pulse Width: 0.4 ms
Lead Channel Pacing Threshold Pulse Width: 0.4 ms
Lead Channel Sensing Intrinsic Amplitude: 0.5 mV
Lead Channel Sensing Intrinsic Amplitude: 7.7 mV
Lead Channel Setting Pacing Amplitude: 0.875
Lead Channel Setting Pacing Amplitude: 2 V
Lead Channel Setting Pacing Pulse Width: 0.4 ms
Lead Channel Setting Sensing Sensitivity: 2 mV
Pulse Gen Model: 2240
Pulse Gen Serial Number: 7759914

## 2021-01-30 NOTE — Progress Notes (Signed)
Remote pacemaker transmission.   

## 2021-02-11 ENCOUNTER — Telehealth: Payer: Self-pay | Admitting: *Deleted

## 2021-02-11 NOTE — Telephone Encounter (Signed)
Called pt/dtr since she is overdue for her Anticoagulation Appt. Left a message for the pt/dtr to call back to get rescheduled. Will await a call back from the dtr since she typically brings her to her appt.

## 2021-03-02 ENCOUNTER — Telehealth: Payer: Self-pay | Admitting: Internal Medicine

## 2021-03-02 NOTE — Telephone Encounter (Signed)
Horris Latino from [facility] called to advise daughter of patient called their office to request hospice service for patient. Per Dr. Unk Pinto he agrees, faxed referral order.

## 2021-03-25 ENCOUNTER — Telehealth: Payer: Self-pay | Admitting: *Deleted

## 2021-03-25 NOTE — Telephone Encounter (Signed)
Erica Carroll called back and stated that she will be going out to see the pt on Friday. She stated that she would be able to check INR while she was at the home and will call us at [phone] with the results. [name]'s cell phone [phone].

## 2021-03-25 NOTE — Telephone Encounter (Signed)
Pt is overdue to have her INR checked. Called and spoke pt's daughter, who stated that pt now has [facility] coming out to the house. Daughter stated that Lovey Newcomer is the nurse and she is suppose to be going out to the house on Friday. [place] and asked if they would be able to check POC INR when they go out to the pt's home, I was then transferred to [place] and had [facility] for her TCB- wanting to see if she will be able to check POC INR.

## 2021-03-27 ENCOUNTER — Ambulatory Visit (INDEPENDENT_AMBULATORY_CARE_PROVIDER_SITE_OTHER): Payer: Medicare HMO

## 2021-03-27 DIAGNOSIS — I480 Paroxysmal atrial fibrillation: Secondary | ICD-10-CM | POA: Diagnosis not present

## 2021-03-27 DIAGNOSIS — Z79899 Other long term (current) drug therapy: Secondary | ICD-10-CM | POA: Diagnosis not present

## 2021-03-27 DIAGNOSIS — Z8673 Personal history of transient ischemic attack (TIA), and cerebral infarction without residual deficits: Secondary | ICD-10-CM | POA: Diagnosis not present

## 2021-03-27 LAB — POCT INR: INR: 5.6 — AB (ref 2.0–3.0)

## 2021-03-27 NOTE — Patient Instructions (Signed)
Description   Spoke with [facility] RN [facility], advised her to have pt hold Warfarin x 3 dosages, then start taking Warfarin 2.5mg  daily except 5mg  on Mondays, Wednesdays and Fridays. Recheck INR in 1 week do to significantly decreased appetite. Call if placed on any new medications [phone].

## 2021-04-03 ENCOUNTER — Ambulatory Visit (INDEPENDENT_AMBULATORY_CARE_PROVIDER_SITE_OTHER): Payer: Medicare HMO | Admitting: Cardiology

## 2021-04-03 DIAGNOSIS — Z79899 Other long term (current) drug therapy: Secondary | ICD-10-CM | POA: Diagnosis not present

## 2021-04-03 DIAGNOSIS — Z8673 Personal history of transient ischemic attack (TIA), and cerebral infarction without residual deficits: Secondary | ICD-10-CM

## 2021-04-03 DIAGNOSIS — I480 Paroxysmal atrial fibrillation: Secondary | ICD-10-CM

## 2021-04-03 LAB — POCT INR
INR: 2.6 (ref 2.0–3.0)
INR: 2.6 (ref 2.0–3.0)

## 2021-04-10 ENCOUNTER — Ambulatory Visit (INDEPENDENT_AMBULATORY_CARE_PROVIDER_SITE_OTHER): Payer: Medicare HMO

## 2021-04-10 DIAGNOSIS — Z8673 Personal history of transient ischemic attack (TIA), and cerebral infarction without residual deficits: Secondary | ICD-10-CM | POA: Diagnosis not present

## 2021-04-10 DIAGNOSIS — Z79899 Other long term (current) drug therapy: Secondary | ICD-10-CM | POA: Diagnosis not present

## 2021-04-10 DIAGNOSIS — I480 Paroxysmal atrial fibrillation: Secondary | ICD-10-CM | POA: Diagnosis not present

## 2021-04-10 LAB — POCT INR: INR: 4.7 — AB (ref 2.0–3.0)

## 2021-04-10 NOTE — Patient Instructions (Signed)
Spoke with Erica Carroll, advised her to have pt skip warfarin tonight and tomorrow, then start new dosage of 2.5mg  daily. Recheck INR in 1 week due to significantly decreased appetite. Call if placed on any new medications [phone].

## 2021-04-15 ENCOUNTER — Ambulatory Visit (INDEPENDENT_AMBULATORY_CARE_PROVIDER_SITE_OTHER): Payer: Medicare HMO | Admitting: *Deleted

## 2021-04-15 DIAGNOSIS — Z5181 Encounter for therapeutic drug level monitoring: Secondary | ICD-10-CM | POA: Diagnosis not present

## 2021-04-15 DIAGNOSIS — I480 Paroxysmal atrial fibrillation: Secondary | ICD-10-CM | POA: Diagnosis not present

## 2021-04-15 DIAGNOSIS — Z8673 Personal history of transient ischemic attack (TIA), and cerebral infarction without residual deficits: Secondary | ICD-10-CM

## 2021-04-15 DIAGNOSIS — Z79899 Other long term (current) drug therapy: Secondary | ICD-10-CM

## 2021-04-15 LAB — POCT INR: INR: 2.5 (ref 2.0–3.0)

## 2021-04-22 ENCOUNTER — Other Ambulatory Visit: Payer: Self-pay | Admitting: Internal Medicine

## 2021-04-22 MED ORDER — METOPROLOL SUCCINATE ER 200 MG PO TB24: ORAL_TABLET | ORAL | 1 refills | Status: DC

## 2021-04-23 ENCOUNTER — Telehealth: Payer: Self-pay

## 2021-04-23 NOTE — Telephone Encounter (Signed)
Spoke with Erica Carroll [facility] RN [facility] to follow-up on INR. Erica Carroll stated Coumadin is going to be discontinued today by [employer] providers d/t pt's decreased appetite and refusing her medications. Will remove from med list and discontinue anti-coagulation encounter.

## 2021-04-28 ENCOUNTER — Ambulatory Visit (INDEPENDENT_AMBULATORY_CARE_PROVIDER_SITE_OTHER): Payer: Medicare HMO

## 2021-04-28 ENCOUNTER — Telehealth: Payer: Self-pay

## 2021-04-28 ENCOUNTER — Other Ambulatory Visit: Payer: Self-pay | Admitting: Internal Medicine

## 2021-04-28 DIAGNOSIS — I495 Sick sinus syndrome: Secondary | ICD-10-CM

## 2021-04-28 LAB — CUP PACEART REMOTE DEVICE CHECK
Battery Remaining Longevity: 39 mo
Battery Remaining Percentage: 42 %
Battery Voltage: 2.96 V
Brady Statistic AP VP Percent: 1.8 %
Brady Statistic AP VS Percent: 68 %
Brady Statistic AS VP Percent: 1 %
Brady Statistic AS VS Percent: 14 %
Brady Statistic RA Percent Paced: 44 %
Brady Statistic RV Percent Paced: 1.4 %
Date Time Interrogation Session: 20220802080943
Implantable Lead Implant Date: 20160518
Implantable Lead Implant Date: 20160518
Implantable Lead Location: 753859
Implantable Lead Location: 753860
Implantable Lead Model: 1944
Implantable Lead Model: 1948
Implantable Pulse Generator Implant Date: 20160518
Lead Channel Impedance Value: 510 Ohm
Lead Channel Impedance Value: 650 Ohm
Lead Channel Pacing Threshold Amplitude: 0.5 V
Lead Channel Pacing Threshold Amplitude: 0.5 V
Lead Channel Pacing Threshold Pulse Width: 0.4 ms
Lead Channel Pacing Threshold Pulse Width: 0.4 ms
Lead Channel Sensing Intrinsic Amplitude: 0.2 mV
Lead Channel Sensing Intrinsic Amplitude: 8.2 mV
Lead Channel Setting Pacing Amplitude: 2 V
Lead Channel Setting Pacing Amplitude: 5 V
Lead Channel Setting Pacing Pulse Width: 0.4 ms
Lead Channel Setting Sensing Sensitivity: 2 mV
Pulse Gen Model: 2240
Pulse Gen Serial Number: 7759914

## 2021-04-28 MED ORDER — METOPROLOL TARTRATE 50 MG PO TABS: 50.0000 mg | ORAL_TABLET | Freq: Every day | ORAL | 11 refills | Status: DC

## 2021-04-28 MED ORDER — METOPROLOL TARTRATE 50 MG PO TABS: ORAL_TABLET | ORAL | 1 refills | Status: DC

## 2021-04-28 MED ORDER — CEPHALEXIN 250 MG PO CAPS: ORAL_CAPSULE | ORAL | 0 refills | Status: DC

## 2021-04-28 NOTE — Telephone Encounter (Signed)
Scheduled remote reviewed. Normal device function.  Presenting rhythm AF with rates 160-170's. AF burden 34%, longest 91hrs (12/19/20). Routing due to current rhythm/rate. Most recent tele note (04/23/21) details patient with decreased appetite and refusing to take meds, Warfarin discontinued. Attempted to send manual transmission, unsuccessful. Phone number given to Erica Carroll to call [facility] and call back about monitor update.   Erica Carroll (on [place]), spoke to her about patient who is currently in hospice care. Daughter states patient does not have any complaints at this time she is resting in bed. Currently in hospice care. RN was at the house earlier and is going to reach out to Dr. Melford Aase about metoprolol and patients heart rate.   [facility] Nurse) Reports patient has progressed with alzheimer's, not eating and drinking, confusion and sleeping >20 hours per day.  States patient has recently had metoprolol changed from taking 25 mg tablets to (1 metoprolol 200 mg tablet ER daily), in hopes to reduce the amount of pills taken a day. States patient has experiences confusion when taking medications for expample "blowing on the pills." States she went to see patient today for visit had difficulty obtaining blood pressure, and pulse ox was reading in the 40's and irregular. States patient is easy to arousal although falls back to sleep very quickly. Reported patient did not have any complaints today and no signs of distress. Advised her I reviewed with Dr. Ileana Ladd and he is agreeable that consulting with hospice provider is appropriate which Izora Gala has already done. Advised I will forward to Dr. [name] for update and please call with any questions or concerns. Appreciative of call.

## 2021-05-04 NOTE — Telephone Encounter (Signed)
Leigh   Thanks SK

## 2021-05-19 ENCOUNTER — Other Ambulatory Visit: Payer: Self-pay | Admitting: Internal Medicine

## 2021-05-19 MED ORDER — METOPROLOL TARTRATE 50 MG PO TABS: ORAL_TABLET | ORAL | 0 refills | Status: DC

## 2021-05-23 NOTE — Progress Notes (Signed)
Remote pacemaker transmission.   

## 2021-05-28 DEATH — deceased
